# Patient Record
Sex: Female | Born: 1940 | Race: White | Hispanic: No | Smoking: Former smoker
Health system: Southern US, Community
[De-identification: ages and names within clinical notes are randomized; demographics above are authoritative.]

## PROBLEM LIST (undated history)

## (undated) DIAGNOSIS — R296 Repeated falls: Secondary | ICD-10-CM

## (undated) DIAGNOSIS — I10 Essential (primary) hypertension: Secondary | ICD-10-CM

## (undated) DIAGNOSIS — R41841 Cognitive communication deficit: Secondary | ICD-10-CM

## (undated) DIAGNOSIS — F32A Depression, unspecified: Secondary | ICD-10-CM

## (undated) DIAGNOSIS — E785 Hyperlipidemia, unspecified: Secondary | ICD-10-CM

## (undated) DIAGNOSIS — F419 Anxiety disorder, unspecified: Secondary | ICD-10-CM

## (undated) DIAGNOSIS — F329 Major depressive disorder, single episode, unspecified: Secondary | ICD-10-CM

## (undated) DIAGNOSIS — F0390 Unspecified dementia without behavioral disturbance: Secondary | ICD-10-CM

## (undated) DIAGNOSIS — T148XXA Other injury of unspecified body region, initial encounter: Secondary | ICD-10-CM

## (undated) DIAGNOSIS — I2510 Atherosclerotic heart disease of native coronary artery without angina pectoris: Secondary | ICD-10-CM

## (undated) DIAGNOSIS — R293 Abnormal posture: Secondary | ICD-10-CM

## (undated) HISTORY — DX: Unspecified dementia, unspecified severity, without behavioral disturbance, psychotic disturbance, mood disturbance, and anxiety: F03.90

## (undated) HISTORY — PX: NECK SURGERY: SHX720

## (undated) HISTORY — DX: Hyperlipidemia, unspecified: E78.5

## (undated) HISTORY — DX: Depression, unspecified: F32.A

## (undated) HISTORY — DX: Repeated falls: R29.6

## (undated) HISTORY — DX: Cognitive communication deficit: R41.841

## (undated) HISTORY — PX: VALVE REPLACEMENT: SUR13

## (undated) HISTORY — DX: Essential (primary) hypertension: I10

## (undated) HISTORY — DX: Anxiety disorder, unspecified: F41.9

## (undated) HISTORY — DX: Major depressive disorder, single episode, unspecified: F32.9

## (undated) HISTORY — DX: Abnormal posture: R29.3

## (undated) HISTORY — DX: Other injury of unspecified body region, initial encounter: T14.8XXA

## (undated) HISTORY — PX: CORONARY ARTERY BYPASS GRAFT: SHX141

## (undated) HISTORY — DX: Atherosclerotic heart disease of native coronary artery without angina pectoris: I25.10

---

## 2014-06-02 DIAGNOSIS — J019 Acute sinusitis, unspecified: Secondary | ICD-10-CM | POA: Diagnosis not present

## 2014-06-02 DIAGNOSIS — J309 Allergic rhinitis, unspecified: Secondary | ICD-10-CM | POA: Diagnosis not present

## 2014-06-02 DIAGNOSIS — F0390 Unspecified dementia without behavioral disturbance: Secondary | ICD-10-CM | POA: Diagnosis not present

## 2014-10-21 DIAGNOSIS — F329 Major depressive disorder, single episode, unspecified: Secondary | ICD-10-CM | POA: Diagnosis not present

## 2014-10-21 DIAGNOSIS — F0390 Unspecified dementia without behavioral disturbance: Secondary | ICD-10-CM | POA: Diagnosis not present

## 2014-10-21 DIAGNOSIS — I10 Essential (primary) hypertension: Secondary | ICD-10-CM | POA: Diagnosis not present

## 2014-10-21 DIAGNOSIS — M545 Low back pain: Secondary | ICD-10-CM | POA: Diagnosis not present

## 2014-11-07 DIAGNOSIS — S0501XA Injury of conjunctiva and corneal abrasion without foreign body, right eye, initial encounter: Secondary | ICD-10-CM | POA: Diagnosis not present

## 2014-11-08 DIAGNOSIS — S0500XD Injury of conjunctiva and corneal abrasion without foreign body, unspecified eye, subsequent encounter: Secondary | ICD-10-CM | POA: Diagnosis not present

## 2014-12-03 DIAGNOSIS — Z01818 Encounter for other preprocedural examination: Secondary | ICD-10-CM | POA: Diagnosis not present

## 2014-12-03 DIAGNOSIS — R002 Palpitations: Secondary | ICD-10-CM | POA: Diagnosis not present

## 2014-12-03 DIAGNOSIS — I2510 Atherosclerotic heart disease of native coronary artery without angina pectoris: Secondary | ICD-10-CM | POA: Diagnosis not present

## 2015-01-02 DIAGNOSIS — L309 Dermatitis, unspecified: Secondary | ICD-10-CM | POA: Diagnosis not present

## 2015-01-02 DIAGNOSIS — M545 Low back pain: Secondary | ICD-10-CM | POA: Diagnosis not present

## 2015-01-02 DIAGNOSIS — E784 Other hyperlipidemia: Secondary | ICD-10-CM | POA: Diagnosis not present

## 2015-01-02 DIAGNOSIS — F329 Major depressive disorder, single episode, unspecified: Secondary | ICD-10-CM | POA: Diagnosis not present

## 2015-09-09 DIAGNOSIS — F329 Major depressive disorder, single episode, unspecified: Secondary | ICD-10-CM | POA: Diagnosis not present

## 2015-09-09 DIAGNOSIS — M545 Low back pain: Secondary | ICD-10-CM | POA: Diagnosis not present

## 2015-09-09 DIAGNOSIS — E785 Hyperlipidemia, unspecified: Secondary | ICD-10-CM | POA: Diagnosis not present

## 2015-10-22 DIAGNOSIS — M4186 Other forms of scoliosis, lumbar region: Secondary | ICD-10-CM | POA: Diagnosis not present

## 2015-11-02 DIAGNOSIS — M4186 Other forms of scoliosis, lumbar region: Secondary | ICD-10-CM | POA: Diagnosis not present

## 2015-11-02 DIAGNOSIS — S32020A Wedge compression fracture of second lumbar vertebra, initial encounter for closed fracture: Secondary | ICD-10-CM | POA: Diagnosis not present

## 2015-11-02 DIAGNOSIS — R937 Abnormal findings on diagnostic imaging of other parts of musculoskeletal system: Secondary | ICD-10-CM | POA: Diagnosis not present

## 2015-12-03 DIAGNOSIS — S32020D Wedge compression fracture of second lumbar vertebra, subsequent encounter for fracture with routine healing: Secondary | ICD-10-CM | POA: Diagnosis not present

## 2015-12-03 DIAGNOSIS — M5126 Other intervertebral disc displacement, lumbar region: Secondary | ICD-10-CM | POA: Diagnosis not present

## 2015-12-30 DIAGNOSIS — S32020D Wedge compression fracture of second lumbar vertebra, subsequent encounter for fracture with routine healing: Secondary | ICD-10-CM | POA: Diagnosis not present

## 2015-12-30 DIAGNOSIS — M5126 Other intervertebral disc displacement, lumbar region: Secondary | ICD-10-CM | POA: Diagnosis not present

## 2016-03-08 DIAGNOSIS — Z23 Encounter for immunization: Secondary | ICD-10-CM | POA: Diagnosis not present

## 2016-03-08 DIAGNOSIS — E785 Hyperlipidemia, unspecified: Secondary | ICD-10-CM | POA: Diagnosis not present

## 2016-03-08 DIAGNOSIS — F3341 Major depressive disorder, recurrent, in partial remission: Secondary | ICD-10-CM | POA: Diagnosis not present

## 2016-03-08 DIAGNOSIS — F0390 Unspecified dementia without behavioral disturbance: Secondary | ICD-10-CM | POA: Diagnosis not present

## 2016-03-08 DIAGNOSIS — M5136 Other intervertebral disc degeneration, lumbar region: Secondary | ICD-10-CM | POA: Diagnosis not present

## 2016-03-08 DIAGNOSIS — I10 Essential (primary) hypertension: Secondary | ICD-10-CM | POA: Diagnosis not present

## 2016-03-18 DIAGNOSIS — M545 Low back pain: Secondary | ICD-10-CM | POA: Diagnosis not present

## 2016-03-23 DIAGNOSIS — M545 Low back pain: Secondary | ICD-10-CM | POA: Diagnosis not present

## 2016-03-25 DIAGNOSIS — M545 Low back pain: Secondary | ICD-10-CM | POA: Diagnosis not present

## 2016-03-30 DIAGNOSIS — M545 Low back pain: Secondary | ICD-10-CM | POA: Diagnosis not present

## 2016-04-01 DIAGNOSIS — M545 Low back pain: Secondary | ICD-10-CM | POA: Diagnosis not present

## 2016-04-06 DIAGNOSIS — M545 Low back pain: Secondary | ICD-10-CM | POA: Diagnosis not present

## 2016-04-08 DIAGNOSIS — M545 Low back pain: Secondary | ICD-10-CM | POA: Diagnosis not present

## 2016-04-13 DIAGNOSIS — M545 Low back pain: Secondary | ICD-10-CM | POA: Diagnosis not present

## 2016-04-15 DIAGNOSIS — M545 Low back pain: Secondary | ICD-10-CM | POA: Diagnosis not present

## 2016-05-09 DIAGNOSIS — L989 Disorder of the skin and subcutaneous tissue, unspecified: Secondary | ICD-10-CM | POA: Diagnosis not present

## 2016-05-09 DIAGNOSIS — M5136 Other intervertebral disc degeneration, lumbar region: Secondary | ICD-10-CM | POA: Diagnosis not present

## 2016-05-09 DIAGNOSIS — F3341 Major depressive disorder, recurrent, in partial remission: Secondary | ICD-10-CM | POA: Diagnosis not present

## 2016-05-09 DIAGNOSIS — I10 Essential (primary) hypertension: Secondary | ICD-10-CM | POA: Diagnosis not present

## 2016-05-09 DIAGNOSIS — F0390 Unspecified dementia without behavioral disturbance: Secondary | ICD-10-CM | POA: Diagnosis not present

## 2016-09-07 DIAGNOSIS — I10 Essential (primary) hypertension: Secondary | ICD-10-CM | POA: Diagnosis not present

## 2016-09-07 DIAGNOSIS — J40 Bronchitis, not specified as acute or chronic: Secondary | ICD-10-CM | POA: Diagnosis not present

## 2016-09-07 DIAGNOSIS — M5136 Other intervertebral disc degeneration, lumbar region: Secondary | ICD-10-CM | POA: Diagnosis not present

## 2016-09-07 DIAGNOSIS — G894 Chronic pain syndrome: Secondary | ICD-10-CM | POA: Diagnosis not present

## 2016-09-07 DIAGNOSIS — F3341 Major depressive disorder, recurrent, in partial remission: Secondary | ICD-10-CM | POA: Diagnosis not present

## 2016-09-07 DIAGNOSIS — F0390 Unspecified dementia without behavioral disturbance: Secondary | ICD-10-CM | POA: Diagnosis not present

## 2016-11-07 DIAGNOSIS — Z1231 Encounter for screening mammogram for malignant neoplasm of breast: Secondary | ICD-10-CM | POA: Diagnosis not present

## 2016-12-27 DIAGNOSIS — Z961 Presence of intraocular lens: Secondary | ICD-10-CM | POA: Diagnosis not present

## 2016-12-27 DIAGNOSIS — H538 Other visual disturbances: Secondary | ICD-10-CM | POA: Diagnosis not present

## 2017-01-06 DIAGNOSIS — F3341 Major depressive disorder, recurrent, in partial remission: Secondary | ICD-10-CM | POA: Diagnosis not present

## 2017-01-06 DIAGNOSIS — E785 Hyperlipidemia, unspecified: Secondary | ICD-10-CM | POA: Diagnosis not present

## 2017-01-06 DIAGNOSIS — G894 Chronic pain syndrome: Secondary | ICD-10-CM | POA: Diagnosis not present

## 2017-01-06 DIAGNOSIS — F0390 Unspecified dementia without behavioral disturbance: Secondary | ICD-10-CM | POA: Diagnosis not present

## 2017-01-06 DIAGNOSIS — I10 Essential (primary) hypertension: Secondary | ICD-10-CM | POA: Diagnosis not present

## 2017-03-07 DIAGNOSIS — M545 Low back pain: Secondary | ICD-10-CM | POA: Diagnosis not present

## 2017-03-07 DIAGNOSIS — Z23 Encounter for immunization: Secondary | ICD-10-CM | POA: Diagnosis not present

## 2017-03-07 DIAGNOSIS — R29898 Other symptoms and signs involving the musculoskeletal system: Secondary | ICD-10-CM | POA: Diagnosis not present

## 2017-03-07 DIAGNOSIS — M5136 Other intervertebral disc degeneration, lumbar region: Secondary | ICD-10-CM | POA: Diagnosis not present

## 2017-04-07 DIAGNOSIS — G894 Chronic pain syndrome: Secondary | ICD-10-CM | POA: Diagnosis not present

## 2017-04-07 DIAGNOSIS — F3341 Major depressive disorder, recurrent, in partial remission: Secondary | ICD-10-CM | POA: Diagnosis not present

## 2017-04-07 DIAGNOSIS — E785 Hyperlipidemia, unspecified: Secondary | ICD-10-CM | POA: Diagnosis not present

## 2017-04-07 DIAGNOSIS — I10 Essential (primary) hypertension: Secondary | ICD-10-CM | POA: Diagnosis not present

## 2017-04-07 DIAGNOSIS — M5136 Other intervertebral disc degeneration, lumbar region: Secondary | ICD-10-CM | POA: Diagnosis not present

## 2017-04-07 DIAGNOSIS — F0390 Unspecified dementia without behavioral disturbance: Secondary | ICD-10-CM | POA: Diagnosis not present

## 2017-04-18 DIAGNOSIS — M9903 Segmental and somatic dysfunction of lumbar region: Secondary | ICD-10-CM | POA: Diagnosis not present

## 2017-04-18 DIAGNOSIS — M5431 Sciatica, right side: Secondary | ICD-10-CM | POA: Diagnosis not present

## 2017-04-18 DIAGNOSIS — M5432 Sciatica, left side: Secondary | ICD-10-CM | POA: Diagnosis not present

## 2017-04-18 DIAGNOSIS — M9905 Segmental and somatic dysfunction of pelvic region: Secondary | ICD-10-CM | POA: Diagnosis not present

## 2017-04-21 DIAGNOSIS — M9905 Segmental and somatic dysfunction of pelvic region: Secondary | ICD-10-CM | POA: Diagnosis not present

## 2017-04-21 DIAGNOSIS — M5432 Sciatica, left side: Secondary | ICD-10-CM | POA: Diagnosis not present

## 2017-04-21 DIAGNOSIS — M5431 Sciatica, right side: Secondary | ICD-10-CM | POA: Diagnosis not present

## 2017-04-21 DIAGNOSIS — M9903 Segmental and somatic dysfunction of lumbar region: Secondary | ICD-10-CM | POA: Diagnosis not present

## 2017-07-14 DIAGNOSIS — G894 Chronic pain syndrome: Secondary | ICD-10-CM | POA: Diagnosis not present

## 2017-07-14 DIAGNOSIS — F0390 Unspecified dementia without behavioral disturbance: Secondary | ICD-10-CM | POA: Diagnosis not present

## 2017-07-14 DIAGNOSIS — R269 Unspecified abnormalities of gait and mobility: Secondary | ICD-10-CM | POA: Diagnosis not present

## 2017-08-02 ENCOUNTER — Ambulatory Visit (INDEPENDENT_AMBULATORY_CARE_PROVIDER_SITE_OTHER): Payer: Medicare Other | Admitting: Neurology

## 2017-08-02 ENCOUNTER — Encounter: Payer: Self-pay | Admitting: Neurology

## 2017-08-02 VITALS — BP 127/74 | HR 60 | Ht 62.0 in | Wt 138.0 lb

## 2017-08-02 DIAGNOSIS — R413 Other amnesia: Secondary | ICD-10-CM

## 2017-08-02 DIAGNOSIS — R269 Unspecified abnormalities of gait and mobility: Secondary | ICD-10-CM

## 2017-08-02 DIAGNOSIS — E538 Deficiency of other specified B group vitamins: Secondary | ICD-10-CM

## 2017-08-02 NOTE — Progress Notes (Signed)
Reason for visit: Dementia, gait disorder  Referring physician: Dr. Garald Braver Jasmine Carrillo is a 77 y.o. female  History of present illness:  Jasmine Carrillo is a 77 year old white female with a history of a progressive memory disorder that has been present since around 2010.  The patient has been on Aricept and Namenda since 2012.  The patient currently is living with her daughter.  The patient requires assistance with most activities of daily living such as bathing and dressing.  The patient can feed herself.  She sleeps fairly well at night.  She has had chronic low back pain, she has claudication type symptoms with her right leg with walking with pain and cramping in the right calf area that improves with rest.  The patient was a prior smoker.  She does not smoke cigarettes currently.  She does not operate a motor vehicle.  She needs help with keeping up with medications and appointments and with the finances.  The patient also has developed some myoclonus of the arms or legs that may occur off and on.  This has been more prominent over the last 2-3 months.  The patient has not had any recent falls, she may fall on occasion.  The patient reports no numbness of the extremities.  She does feel somewhat weak in the legs.  She does have some urinary incontinence issues.  The patient is sent to this office for further evaluation.  She has started to shuffle her feet with walking, there is some concern for parkinsonism.  Past Medical History:  Diagnosis Date  . Anxiety   . Depression     Past Surgical History:  Procedure Laterality Date  . CORONARY ARTERY BYPASS GRAFT      History reviewed. No pertinent family history.  Social history:  reports that she quit smoking about 11 years ago. she has never used smokeless tobacco. She reports that she drinks alcohol. She reports that she does not use drugs.  Medications:  Prior to Admission medications   Medication Sig Start Date End Date Taking?  Authorizing Provider  ASPIRIN 81 PO Take 1 tablet by mouth daily.   Yes [provider]  CALCIUM PO Take 1 tablet by mouth daily.   Yes [provider]  carvedilol (COREG) 12.5 MG tablet Take 1 tablet by mouth 2 (two) times daily. 03/23/11  Yes [provider]  donepezil (ARICEPT) 10 MG tablet Take 1 tablet by mouth 2 (two) times daily. 03/23/11  Yes [provider]  memantine (NAMENDA) 10 MG tablet Take 1 tablet by mouth 2 (two) times daily. 02/24/15  Yes [provider]  Multiple Vitamins-Minerals (MULTIVITAMIN PO) Take 1 tablet by mouth daily.   Yes [provider]  simvastatin (ZOCOR) 80 MG tablet Take 0.5 tablets by mouth daily. 03/23/11  Yes [provider]  Tretinoin, Facial Wrinkles, (TRETINOIN, EMOLLIENT,) 0.05 % CREA Take 1 application by mouth as needed. 06/20/11  Yes [provider]     No Known Allergies  ROS:  Out of a complete 14 system review of symptoms, the patient complains only of the following symptoms, and all other reviewed systems are negative.  Weight loss, fatigue Swelling in the legs Snoring Easy bruising Joint pain, aching muscles Memory loss, confusion, weakness, slurred speech, tremor Depression, anxiety, change in appetite  Blood pressure 127/74, pulse 60, height 5\' 2"  (1.575 m), weight 138 lb (62.6 kg).  Physical Exam  General: The patient is alert and cooperative at the time  of the examination.  Eyes: Pupils are equal, round, and reactive to light. Discs are flat bilaterally.  Neck: The neck is supple, no carotid bruits are noted.  Respiratory: The respiratory examination is clear.  Cardiovascular: The cardiovascular examination reveals a regular rate and rhythm, no obvious murmurs or rubs are noted.  Skin: Extremities are without significant edema.  Neurologic Exam  Mental status: The patient is alert and oriented x 1 at the time of the examination (oriented only to  name). The Mini-Mental status examination done today shows a total score 4/30.  Cranial nerves: Facial symmetry is present. There is good sensation of the face to pinprick and soft touch bilaterally. The strength of the facial muscles and the muscles to head turning and shoulder shrug are normal bilaterally. Speech is well enunciated, no aphasia or dysarthria is noted. Extraocular movements are full. Visual fields are full. The tongue is midline, and the patient has symmetric elevation of the soft palate. No obvious hearing deficits are noted.  The patient does have some mild masking of the face.  Motor: The motor testing reveals 5 over 5 strength of all 4 extremities. Good symmetric motor tone is noted throughout.  Sensory: Sensory testing is intact to pinprick, soft touch and vibration sensation on all 4 extremities. No evidence of extinction is noted.  Coordination: Cerebellar testing reveals significant apraxia with finger-nose-finger and heel shin bilaterally.  The patient is not able to perform these tasks.  Occasional myoclonus is seen in the upper extremities.  Mild asterixis is seen with arms outstretched.  Gait and station: The patient is able to arise from a seated position with arms crossed with some difficulty.  Once up, she is able to ambulate independently, posture is stooped, she takes shuffling steps.  Gait is slightly wide-based.  Tandem gait was not attempted.  Romberg is negative.  Reflexes: Deep tendon reflexes are symmetric and normal bilaterally. Toes are downgoing bilaterally.   Assessment/Plan:  1.  Alzheimer's disease, dementia  2.  Gait disturbance  3.  Myoclonus/asterixis  4.  Low back pain, right leg claudication  The patient has severe dementia.  She has evidence of significant apraxia with use of the extremities and this may be translating into changes with walking.  The patient does not have clear Parkinson's disease, but this will need to be followed over  time.  The patient does have some myoclonus which may be associated with the use of hydrocodone but also can be seen with significant dementia.  The patient does have low back issues as well, she appears to have what could be a claudication syndrome of the right leg.  The patient will be set up for blood work today, she will have CT scan of the brain.  She will follow-up in 4 or 5 months.  Marlan Palau. Keith Willis MD 08/02/2017 12:09 PM  [facility] [address] [place], [place] [id number]  Phone [phone] Fax [phone]

## 2017-08-02 NOTE — Patient Instructions (Signed)
   We will check CT of the brain. 

## 2017-08-03 LAB — RPR: RPR: NONREACTIVE

## 2017-08-03 LAB — COMPREHENSIVE METABOLIC PANEL
ALT: 10 IU/L (ref 0–32)
AST: 15 IU/L (ref 0–40)
Albumin/Globulin Ratio: 1.5 (ref 1.2–2.2)
Albumin: 4 g/dL (ref 3.5–4.8)
Alkaline Phosphatase: 68 IU/L (ref 39–117)
BUN/Creatinine Ratio: 40 — ABNORMAL HIGH (ref 12–28)
BUN: 25 mg/dL (ref 8–27)
Bilirubin Total: 0.2 mg/dL (ref 0.0–1.2)
CALCIUM: 8.9 mg/dL (ref 8.7–10.3)
CHLORIDE: 104 mmol/L (ref 96–106)
CO2: 27 mmol/L (ref 20–29)
Creatinine, Ser: 0.62 mg/dL (ref 0.57–1.00)
GFR, EST AFRICAN AMERICAN: 101 mL/min/{1.73_m2} (ref >59)
GFR, EST NON AFRICAN AMERICAN: 88 mL/min/{1.73_m2} (ref >59)
Globulin, Total: 2.7 g/dL (ref 1.5–4.5)
Glucose: 85 mg/dL (ref 65–99)
Potassium: 4.8 mmol/L (ref 3.5–5.2)
Sodium: 143 mmol/L (ref 134–144)
TOTAL PROTEIN: 6.7 g/dL (ref 6.0–8.5)

## 2017-08-03 LAB — SEDIMENTATION RATE: SED RATE: 4 mm/h (ref 0–40)

## 2017-08-03 LAB — VITAMIN B12: Vitamin B-12: 485 pg/mL (ref 232–1245)

## 2017-08-03 LAB — AMMONIA: AMMONIA: 68 ug/dL (ref 19–87)

## 2017-08-04 ENCOUNTER — Telehealth: Payer: Self-pay

## 2017-08-04 NOTE — Telephone Encounter (Signed)
I called Jasmine Carrillo, advised her that Dr. Anne HahnWillis reviewed her blood work and reports that it is unremarkable. Jasmine Carrillo verbalized understanding of results. Jasmine Carrillo had no questions at this time but was encouraged to call back if questions arise.

## 2017-08-04 NOTE — Telephone Encounter (Signed)
-----   Message from York Spanielharles K Willis, MD sent at 08/03/2017  5:27 PM EST -----  The blood work results are unremarkable. Please call the patient.  ----- Message ----- From: Nell RangeInterface, [facility] Lab Results In Sent: 08/03/2017   7:45 AM To: York Spanielharles K Willis, MD

## 2017-08-14 ENCOUNTER — Other Ambulatory Visit: Payer: Self-pay | Admitting: Family Medicine

## 2017-08-14 ENCOUNTER — Ambulatory Visit
Admission: RE | Admit: 2017-08-14 | Discharge: 2017-08-14 | Disposition: A | Discharge status: Home / Self Care | Payer: Medicare Other | Source: Ambulatory Visit | Attending: Family Medicine | Admitting: Family Medicine

## 2017-08-14 DIAGNOSIS — M545 Low back pain, unspecified: Secondary | ICD-10-CM

## 2017-09-04 DIAGNOSIS — G894 Chronic pain syndrome: Secondary | ICD-10-CM | POA: Diagnosis not present

## 2017-09-04 DIAGNOSIS — M5136 Other intervertebral disc degeneration, lumbar region: Secondary | ICD-10-CM | POA: Diagnosis not present

## 2017-09-04 DIAGNOSIS — F3341 Major depressive disorder, recurrent, in partial remission: Secondary | ICD-10-CM | POA: Diagnosis not present

## 2017-09-04 DIAGNOSIS — F0390 Unspecified dementia without behavioral disturbance: Secondary | ICD-10-CM | POA: Diagnosis not present

## 2017-09-10 DIAGNOSIS — J449 Chronic obstructive pulmonary disease, unspecified: Secondary | ICD-10-CM | POA: Diagnosis not present

## 2017-09-10 DIAGNOSIS — I739 Peripheral vascular disease, unspecified: Secondary | ICD-10-CM | POA: Diagnosis not present

## 2017-09-10 DIAGNOSIS — G894 Chronic pain syndrome: Secondary | ICD-10-CM | POA: Diagnosis not present

## 2017-09-10 DIAGNOSIS — M5136 Other intervertebral disc degeneration, lumbar region: Secondary | ICD-10-CM | POA: Diagnosis not present

## 2017-09-10 DIAGNOSIS — Z87891 Personal history of nicotine dependence: Secondary | ICD-10-CM | POA: Diagnosis not present

## 2017-09-10 DIAGNOSIS — S32000D Wedge compression fracture of unspecified lumbar vertebra, subsequent encounter for fracture with routine healing: Secondary | ICD-10-CM | POA: Diagnosis not present

## 2017-09-10 DIAGNOSIS — E785 Hyperlipidemia, unspecified: Secondary | ICD-10-CM | POA: Diagnosis not present

## 2017-09-10 DIAGNOSIS — F3341 Major depressive disorder, recurrent, in partial remission: Secondary | ICD-10-CM | POA: Diagnosis not present

## 2017-09-10 DIAGNOSIS — I10 Essential (primary) hypertension: Secondary | ICD-10-CM | POA: Diagnosis not present

## 2017-09-10 DIAGNOSIS — F0390 Unspecified dementia without behavioral disturbance: Secondary | ICD-10-CM | POA: Diagnosis not present

## 2017-09-13 DIAGNOSIS — S32000D Wedge compression fracture of unspecified lumbar vertebra, subsequent encounter for fracture with routine healing: Secondary | ICD-10-CM | POA: Diagnosis not present

## 2017-09-13 DIAGNOSIS — F0390 Unspecified dementia without behavioral disturbance: Secondary | ICD-10-CM | POA: Diagnosis not present

## 2017-09-13 DIAGNOSIS — G894 Chronic pain syndrome: Secondary | ICD-10-CM | POA: Diagnosis not present

## 2017-09-13 DIAGNOSIS — J449 Chronic obstructive pulmonary disease, unspecified: Secondary | ICD-10-CM | POA: Diagnosis not present

## 2017-09-13 DIAGNOSIS — I739 Peripheral vascular disease, unspecified: Secondary | ICD-10-CM | POA: Diagnosis not present

## 2017-09-13 DIAGNOSIS — M5136 Other intervertebral disc degeneration, lumbar region: Secondary | ICD-10-CM | POA: Diagnosis not present

## 2017-09-20 DIAGNOSIS — S32000D Wedge compression fracture of unspecified lumbar vertebra, subsequent encounter for fracture with routine healing: Secondary | ICD-10-CM | POA: Diagnosis not present

## 2017-09-20 DIAGNOSIS — F0390 Unspecified dementia without behavioral disturbance: Secondary | ICD-10-CM | POA: Diagnosis not present

## 2017-09-20 DIAGNOSIS — J449 Chronic obstructive pulmonary disease, unspecified: Secondary | ICD-10-CM | POA: Diagnosis not present

## 2017-09-20 DIAGNOSIS — I739 Peripheral vascular disease, unspecified: Secondary | ICD-10-CM | POA: Diagnosis not present

## 2017-09-20 DIAGNOSIS — M5136 Other intervertebral disc degeneration, lumbar region: Secondary | ICD-10-CM | POA: Diagnosis not present

## 2017-09-20 DIAGNOSIS — G894 Chronic pain syndrome: Secondary | ICD-10-CM | POA: Diagnosis not present

## 2017-09-22 DIAGNOSIS — M5136 Other intervertebral disc degeneration, lumbar region: Secondary | ICD-10-CM | POA: Diagnosis not present

## 2017-09-22 DIAGNOSIS — S32000D Wedge compression fracture of unspecified lumbar vertebra, subsequent encounter for fracture with routine healing: Secondary | ICD-10-CM | POA: Diagnosis not present

## 2017-09-22 DIAGNOSIS — F0390 Unspecified dementia without behavioral disturbance: Secondary | ICD-10-CM | POA: Diagnosis not present

## 2017-09-22 DIAGNOSIS — G894 Chronic pain syndrome: Secondary | ICD-10-CM | POA: Diagnosis not present

## 2017-09-22 DIAGNOSIS — I739 Peripheral vascular disease, unspecified: Secondary | ICD-10-CM | POA: Diagnosis not present

## 2017-09-22 DIAGNOSIS — J449 Chronic obstructive pulmonary disease, unspecified: Secondary | ICD-10-CM | POA: Diagnosis not present

## 2017-09-27 DIAGNOSIS — F0390 Unspecified dementia without behavioral disturbance: Secondary | ICD-10-CM | POA: Diagnosis not present

## 2017-09-27 DIAGNOSIS — I739 Peripheral vascular disease, unspecified: Secondary | ICD-10-CM | POA: Diagnosis not present

## 2017-09-27 DIAGNOSIS — M5136 Other intervertebral disc degeneration, lumbar region: Secondary | ICD-10-CM | POA: Diagnosis not present

## 2017-09-27 DIAGNOSIS — J449 Chronic obstructive pulmonary disease, unspecified: Secondary | ICD-10-CM | POA: Diagnosis not present

## 2017-09-27 DIAGNOSIS — S32000D Wedge compression fracture of unspecified lumbar vertebra, subsequent encounter for fracture with routine healing: Secondary | ICD-10-CM | POA: Diagnosis not present

## 2017-09-27 DIAGNOSIS — G894 Chronic pain syndrome: Secondary | ICD-10-CM | POA: Diagnosis not present

## 2017-09-29 DIAGNOSIS — M5136 Other intervertebral disc degeneration, lumbar region: Secondary | ICD-10-CM | POA: Diagnosis not present

## 2017-09-29 DIAGNOSIS — G894 Chronic pain syndrome: Secondary | ICD-10-CM | POA: Diagnosis not present

## 2017-09-29 DIAGNOSIS — I739 Peripheral vascular disease, unspecified: Secondary | ICD-10-CM | POA: Diagnosis not present

## 2017-09-29 DIAGNOSIS — J449 Chronic obstructive pulmonary disease, unspecified: Secondary | ICD-10-CM | POA: Diagnosis not present

## 2017-09-29 DIAGNOSIS — F0390 Unspecified dementia without behavioral disturbance: Secondary | ICD-10-CM | POA: Diagnosis not present

## 2017-09-29 DIAGNOSIS — S32000D Wedge compression fracture of unspecified lumbar vertebra, subsequent encounter for fracture with routine healing: Secondary | ICD-10-CM | POA: Diagnosis not present

## 2017-10-03 DIAGNOSIS — J449 Chronic obstructive pulmonary disease, unspecified: Secondary | ICD-10-CM | POA: Diagnosis not present

## 2017-10-03 DIAGNOSIS — M5136 Other intervertebral disc degeneration, lumbar region: Secondary | ICD-10-CM | POA: Diagnosis not present

## 2017-10-03 DIAGNOSIS — S32000D Wedge compression fracture of unspecified lumbar vertebra, subsequent encounter for fracture with routine healing: Secondary | ICD-10-CM | POA: Diagnosis not present

## 2017-10-03 DIAGNOSIS — F0390 Unspecified dementia without behavioral disturbance: Secondary | ICD-10-CM | POA: Diagnosis not present

## 2017-10-03 DIAGNOSIS — I739 Peripheral vascular disease, unspecified: Secondary | ICD-10-CM | POA: Diagnosis not present

## 2017-10-03 DIAGNOSIS — G894 Chronic pain syndrome: Secondary | ICD-10-CM | POA: Diagnosis not present

## 2017-10-06 DIAGNOSIS — I739 Peripheral vascular disease, unspecified: Secondary | ICD-10-CM | POA: Diagnosis not present

## 2017-10-06 DIAGNOSIS — J449 Chronic obstructive pulmonary disease, unspecified: Secondary | ICD-10-CM | POA: Diagnosis not present

## 2017-10-06 DIAGNOSIS — S32000D Wedge compression fracture of unspecified lumbar vertebra, subsequent encounter for fracture with routine healing: Secondary | ICD-10-CM | POA: Diagnosis not present

## 2017-10-06 DIAGNOSIS — F0390 Unspecified dementia without behavioral disturbance: Secondary | ICD-10-CM | POA: Diagnosis not present

## 2017-10-06 DIAGNOSIS — M5136 Other intervertebral disc degeneration, lumbar region: Secondary | ICD-10-CM | POA: Diagnosis not present

## 2017-10-06 DIAGNOSIS — G894 Chronic pain syndrome: Secondary | ICD-10-CM | POA: Diagnosis not present

## 2017-10-11 DIAGNOSIS — F0390 Unspecified dementia without behavioral disturbance: Secondary | ICD-10-CM | POA: Diagnosis not present

## 2017-10-11 DIAGNOSIS — G894 Chronic pain syndrome: Secondary | ICD-10-CM | POA: Diagnosis not present

## 2017-10-11 DIAGNOSIS — J449 Chronic obstructive pulmonary disease, unspecified: Secondary | ICD-10-CM | POA: Diagnosis not present

## 2017-10-11 DIAGNOSIS — S32000D Wedge compression fracture of unspecified lumbar vertebra, subsequent encounter for fracture with routine healing: Secondary | ICD-10-CM | POA: Diagnosis not present

## 2017-10-11 DIAGNOSIS — I739 Peripheral vascular disease, unspecified: Secondary | ICD-10-CM | POA: Diagnosis not present

## 2017-10-11 DIAGNOSIS — M5136 Other intervertebral disc degeneration, lumbar region: Secondary | ICD-10-CM | POA: Diagnosis not present

## 2017-10-17 DIAGNOSIS — J449 Chronic obstructive pulmonary disease, unspecified: Secondary | ICD-10-CM | POA: Diagnosis not present

## 2017-10-17 DIAGNOSIS — S32000D Wedge compression fracture of unspecified lumbar vertebra, subsequent encounter for fracture with routine healing: Secondary | ICD-10-CM | POA: Diagnosis not present

## 2017-10-17 DIAGNOSIS — I739 Peripheral vascular disease, unspecified: Secondary | ICD-10-CM | POA: Diagnosis not present

## 2017-10-17 DIAGNOSIS — M5136 Other intervertebral disc degeneration, lumbar region: Secondary | ICD-10-CM | POA: Diagnosis not present

## 2017-10-17 DIAGNOSIS — G894 Chronic pain syndrome: Secondary | ICD-10-CM | POA: Diagnosis not present

## 2017-10-17 DIAGNOSIS — F0390 Unspecified dementia without behavioral disturbance: Secondary | ICD-10-CM | POA: Diagnosis not present

## 2017-12-14 DIAGNOSIS — R51 Headache: Secondary | ICD-10-CM | POA: Diagnosis not present

## 2017-12-14 DIAGNOSIS — F3341 Major depressive disorder, recurrent, in partial remission: Secondary | ICD-10-CM | POA: Diagnosis not present

## 2017-12-14 DIAGNOSIS — G894 Chronic pain syndrome: Secondary | ICD-10-CM | POA: Diagnosis not present

## 2017-12-14 DIAGNOSIS — F0390 Unspecified dementia without behavioral disturbance: Secondary | ICD-10-CM | POA: Diagnosis not present

## 2017-12-14 DIAGNOSIS — M5136 Other intervertebral disc degeneration, lumbar region: Secondary | ICD-10-CM | POA: Diagnosis not present

## 2017-12-14 DIAGNOSIS — E785 Hyperlipidemia, unspecified: Secondary | ICD-10-CM | POA: Diagnosis not present

## 2017-12-14 DIAGNOSIS — I10 Essential (primary) hypertension: Secondary | ICD-10-CM | POA: Diagnosis not present

## 2017-12-20 DIAGNOSIS — E785 Hyperlipidemia, unspecified: Secondary | ICD-10-CM | POA: Diagnosis not present

## 2017-12-20 DIAGNOSIS — Z87891 Personal history of nicotine dependence: Secondary | ICD-10-CM | POA: Diagnosis not present

## 2017-12-20 DIAGNOSIS — Z7982 Long term (current) use of aspirin: Secondary | ICD-10-CM | POA: Diagnosis not present

## 2017-12-20 DIAGNOSIS — F0280 Dementia in other diseases classified elsewhere without behavioral disturbance: Secondary | ICD-10-CM | POA: Diagnosis not present

## 2017-12-20 DIAGNOSIS — J449 Chronic obstructive pulmonary disease, unspecified: Secondary | ICD-10-CM | POA: Diagnosis not present

## 2017-12-20 DIAGNOSIS — M5136 Other intervertebral disc degeneration, lumbar region: Secondary | ICD-10-CM | POA: Diagnosis not present

## 2017-12-20 DIAGNOSIS — G894 Chronic pain syndrome: Secondary | ICD-10-CM | POA: Diagnosis not present

## 2017-12-20 DIAGNOSIS — I10 Essential (primary) hypertension: Secondary | ICD-10-CM | POA: Diagnosis not present

## 2017-12-20 DIAGNOSIS — I739 Peripheral vascular disease, unspecified: Secondary | ICD-10-CM | POA: Diagnosis not present

## 2017-12-21 DIAGNOSIS — M5136 Other intervertebral disc degeneration, lumbar region: Secondary | ICD-10-CM | POA: Diagnosis not present

## 2017-12-21 DIAGNOSIS — G894 Chronic pain syndrome: Secondary | ICD-10-CM | POA: Diagnosis not present

## 2017-12-21 DIAGNOSIS — J449 Chronic obstructive pulmonary disease, unspecified: Secondary | ICD-10-CM | POA: Diagnosis not present

## 2017-12-21 DIAGNOSIS — I739 Peripheral vascular disease, unspecified: Secondary | ICD-10-CM | POA: Diagnosis not present

## 2017-12-21 DIAGNOSIS — F0280 Dementia in other diseases classified elsewhere without behavioral disturbance: Secondary | ICD-10-CM | POA: Diagnosis not present

## 2017-12-21 DIAGNOSIS — I10 Essential (primary) hypertension: Secondary | ICD-10-CM | POA: Diagnosis not present

## 2017-12-27 DIAGNOSIS — I739 Peripheral vascular disease, unspecified: Secondary | ICD-10-CM | POA: Diagnosis not present

## 2017-12-27 DIAGNOSIS — J449 Chronic obstructive pulmonary disease, unspecified: Secondary | ICD-10-CM | POA: Diagnosis not present

## 2017-12-27 DIAGNOSIS — I10 Essential (primary) hypertension: Secondary | ICD-10-CM | POA: Diagnosis not present

## 2017-12-27 DIAGNOSIS — G894 Chronic pain syndrome: Secondary | ICD-10-CM | POA: Diagnosis not present

## 2017-12-27 DIAGNOSIS — M5136 Other intervertebral disc degeneration, lumbar region: Secondary | ICD-10-CM | POA: Diagnosis not present

## 2017-12-27 DIAGNOSIS — F0280 Dementia in other diseases classified elsewhere without behavioral disturbance: Secondary | ICD-10-CM | POA: Diagnosis not present

## 2017-12-28 DIAGNOSIS — J449 Chronic obstructive pulmonary disease, unspecified: Secondary | ICD-10-CM | POA: Diagnosis not present

## 2017-12-28 DIAGNOSIS — F0280 Dementia in other diseases classified elsewhere without behavioral disturbance: Secondary | ICD-10-CM | POA: Diagnosis not present

## 2017-12-28 DIAGNOSIS — M5136 Other intervertebral disc degeneration, lumbar region: Secondary | ICD-10-CM | POA: Diagnosis not present

## 2017-12-28 DIAGNOSIS — I10 Essential (primary) hypertension: Secondary | ICD-10-CM | POA: Diagnosis not present

## 2017-12-28 DIAGNOSIS — I739 Peripheral vascular disease, unspecified: Secondary | ICD-10-CM | POA: Diagnosis not present

## 2017-12-28 DIAGNOSIS — G894 Chronic pain syndrome: Secondary | ICD-10-CM | POA: Diagnosis not present

## 2017-12-29 DIAGNOSIS — G894 Chronic pain syndrome: Secondary | ICD-10-CM | POA: Diagnosis not present

## 2017-12-29 DIAGNOSIS — I10 Essential (primary) hypertension: Secondary | ICD-10-CM | POA: Diagnosis not present

## 2017-12-29 DIAGNOSIS — F0280 Dementia in other diseases classified elsewhere without behavioral disturbance: Secondary | ICD-10-CM | POA: Diagnosis not present

## 2017-12-29 DIAGNOSIS — M5136 Other intervertebral disc degeneration, lumbar region: Secondary | ICD-10-CM | POA: Diagnosis not present

## 2017-12-29 DIAGNOSIS — I739 Peripheral vascular disease, unspecified: Secondary | ICD-10-CM | POA: Diagnosis not present

## 2017-12-29 DIAGNOSIS — J449 Chronic obstructive pulmonary disease, unspecified: Secondary | ICD-10-CM | POA: Diagnosis not present

## 2018-01-02 DIAGNOSIS — I739 Peripheral vascular disease, unspecified: Secondary | ICD-10-CM | POA: Diagnosis not present

## 2018-01-02 DIAGNOSIS — F0280 Dementia in other diseases classified elsewhere without behavioral disturbance: Secondary | ICD-10-CM | POA: Diagnosis not present

## 2018-01-02 DIAGNOSIS — M5136 Other intervertebral disc degeneration, lumbar region: Secondary | ICD-10-CM | POA: Diagnosis not present

## 2018-01-02 DIAGNOSIS — G894 Chronic pain syndrome: Secondary | ICD-10-CM | POA: Diagnosis not present

## 2018-01-02 DIAGNOSIS — I10 Essential (primary) hypertension: Secondary | ICD-10-CM | POA: Diagnosis not present

## 2018-01-02 DIAGNOSIS — J449 Chronic obstructive pulmonary disease, unspecified: Secondary | ICD-10-CM | POA: Diagnosis not present

## 2018-01-04 DIAGNOSIS — I739 Peripheral vascular disease, unspecified: Secondary | ICD-10-CM | POA: Diagnosis not present

## 2018-01-04 DIAGNOSIS — G894 Chronic pain syndrome: Secondary | ICD-10-CM | POA: Diagnosis not present

## 2018-01-04 DIAGNOSIS — J449 Chronic obstructive pulmonary disease, unspecified: Secondary | ICD-10-CM | POA: Diagnosis not present

## 2018-01-04 DIAGNOSIS — I10 Essential (primary) hypertension: Secondary | ICD-10-CM | POA: Diagnosis not present

## 2018-01-04 DIAGNOSIS — F0280 Dementia in other diseases classified elsewhere without behavioral disturbance: Secondary | ICD-10-CM | POA: Diagnosis not present

## 2018-01-04 DIAGNOSIS — M5136 Other intervertebral disc degeneration, lumbar region: Secondary | ICD-10-CM | POA: Diagnosis not present

## 2018-01-05 DIAGNOSIS — G894 Chronic pain syndrome: Secondary | ICD-10-CM | POA: Diagnosis not present

## 2018-01-05 DIAGNOSIS — F0280 Dementia in other diseases classified elsewhere without behavioral disturbance: Secondary | ICD-10-CM | POA: Diagnosis not present

## 2018-01-05 DIAGNOSIS — J449 Chronic obstructive pulmonary disease, unspecified: Secondary | ICD-10-CM | POA: Diagnosis not present

## 2018-01-05 DIAGNOSIS — I739 Peripheral vascular disease, unspecified: Secondary | ICD-10-CM | POA: Diagnosis not present

## 2018-01-05 DIAGNOSIS — M5136 Other intervertebral disc degeneration, lumbar region: Secondary | ICD-10-CM | POA: Diagnosis not present

## 2018-01-05 DIAGNOSIS — I10 Essential (primary) hypertension: Secondary | ICD-10-CM | POA: Diagnosis not present

## 2018-01-09 DIAGNOSIS — I10 Essential (primary) hypertension: Secondary | ICD-10-CM | POA: Diagnosis not present

## 2018-01-09 DIAGNOSIS — I739 Peripheral vascular disease, unspecified: Secondary | ICD-10-CM | POA: Diagnosis not present

## 2018-01-09 DIAGNOSIS — J449 Chronic obstructive pulmonary disease, unspecified: Secondary | ICD-10-CM | POA: Diagnosis not present

## 2018-01-09 DIAGNOSIS — F0280 Dementia in other diseases classified elsewhere without behavioral disturbance: Secondary | ICD-10-CM | POA: Diagnosis not present

## 2018-01-09 DIAGNOSIS — M5136 Other intervertebral disc degeneration, lumbar region: Secondary | ICD-10-CM | POA: Diagnosis not present

## 2018-01-09 DIAGNOSIS — G894 Chronic pain syndrome: Secondary | ICD-10-CM | POA: Diagnosis not present

## 2018-01-11 DIAGNOSIS — F0280 Dementia in other diseases classified elsewhere without behavioral disturbance: Secondary | ICD-10-CM | POA: Diagnosis not present

## 2018-01-11 DIAGNOSIS — I10 Essential (primary) hypertension: Secondary | ICD-10-CM | POA: Diagnosis not present

## 2018-01-11 DIAGNOSIS — G894 Chronic pain syndrome: Secondary | ICD-10-CM | POA: Diagnosis not present

## 2018-01-11 DIAGNOSIS — J449 Chronic obstructive pulmonary disease, unspecified: Secondary | ICD-10-CM | POA: Diagnosis not present

## 2018-01-11 DIAGNOSIS — I739 Peripheral vascular disease, unspecified: Secondary | ICD-10-CM | POA: Diagnosis not present

## 2018-01-11 DIAGNOSIS — M5136 Other intervertebral disc degeneration, lumbar region: Secondary | ICD-10-CM | POA: Diagnosis not present

## 2018-01-17 DIAGNOSIS — M5136 Other intervertebral disc degeneration, lumbar region: Secondary | ICD-10-CM | POA: Diagnosis not present

## 2018-01-17 DIAGNOSIS — I10 Essential (primary) hypertension: Secondary | ICD-10-CM | POA: Diagnosis not present

## 2018-01-17 DIAGNOSIS — G894 Chronic pain syndrome: Secondary | ICD-10-CM | POA: Diagnosis not present

## 2018-01-17 DIAGNOSIS — I739 Peripheral vascular disease, unspecified: Secondary | ICD-10-CM | POA: Diagnosis not present

## 2018-01-17 DIAGNOSIS — J449 Chronic obstructive pulmonary disease, unspecified: Secondary | ICD-10-CM | POA: Diagnosis not present

## 2018-01-17 DIAGNOSIS — F0280 Dementia in other diseases classified elsewhere without behavioral disturbance: Secondary | ICD-10-CM | POA: Diagnosis not present

## 2018-01-19 DIAGNOSIS — F0280 Dementia in other diseases classified elsewhere without behavioral disturbance: Secondary | ICD-10-CM | POA: Diagnosis not present

## 2018-01-19 DIAGNOSIS — J449 Chronic obstructive pulmonary disease, unspecified: Secondary | ICD-10-CM | POA: Diagnosis not present

## 2018-01-19 DIAGNOSIS — I10 Essential (primary) hypertension: Secondary | ICD-10-CM | POA: Diagnosis not present

## 2018-01-19 DIAGNOSIS — I739 Peripheral vascular disease, unspecified: Secondary | ICD-10-CM | POA: Diagnosis not present

## 2018-01-19 DIAGNOSIS — G894 Chronic pain syndrome: Secondary | ICD-10-CM | POA: Diagnosis not present

## 2018-01-19 DIAGNOSIS — M5136 Other intervertebral disc degeneration, lumbar region: Secondary | ICD-10-CM | POA: Diagnosis not present

## 2018-01-24 DIAGNOSIS — M5136 Other intervertebral disc degeneration, lumbar region: Secondary | ICD-10-CM | POA: Diagnosis not present

## 2018-01-24 DIAGNOSIS — I10 Essential (primary) hypertension: Secondary | ICD-10-CM | POA: Diagnosis not present

## 2018-01-24 DIAGNOSIS — F0280 Dementia in other diseases classified elsewhere without behavioral disturbance: Secondary | ICD-10-CM | POA: Diagnosis not present

## 2018-01-24 DIAGNOSIS — J449 Chronic obstructive pulmonary disease, unspecified: Secondary | ICD-10-CM | POA: Diagnosis not present

## 2018-01-24 DIAGNOSIS — I739 Peripheral vascular disease, unspecified: Secondary | ICD-10-CM | POA: Diagnosis not present

## 2018-01-24 DIAGNOSIS — G894 Chronic pain syndrome: Secondary | ICD-10-CM | POA: Diagnosis not present

## 2018-01-25 DIAGNOSIS — F0280 Dementia in other diseases classified elsewhere without behavioral disturbance: Secondary | ICD-10-CM | POA: Diagnosis not present

## 2018-01-25 DIAGNOSIS — J449 Chronic obstructive pulmonary disease, unspecified: Secondary | ICD-10-CM | POA: Diagnosis not present

## 2018-01-25 DIAGNOSIS — G894 Chronic pain syndrome: Secondary | ICD-10-CM | POA: Diagnosis not present

## 2018-01-25 DIAGNOSIS — I739 Peripheral vascular disease, unspecified: Secondary | ICD-10-CM | POA: Diagnosis not present

## 2018-01-25 DIAGNOSIS — I10 Essential (primary) hypertension: Secondary | ICD-10-CM | POA: Diagnosis not present

## 2018-01-25 DIAGNOSIS — M5136 Other intervertebral disc degeneration, lumbar region: Secondary | ICD-10-CM | POA: Diagnosis not present

## 2018-01-26 DIAGNOSIS — I10 Essential (primary) hypertension: Secondary | ICD-10-CM | POA: Diagnosis not present

## 2018-01-26 DIAGNOSIS — I739 Peripheral vascular disease, unspecified: Secondary | ICD-10-CM | POA: Diagnosis not present

## 2018-01-26 DIAGNOSIS — G894 Chronic pain syndrome: Secondary | ICD-10-CM | POA: Diagnosis not present

## 2018-01-26 DIAGNOSIS — F0280 Dementia in other diseases classified elsewhere without behavioral disturbance: Secondary | ICD-10-CM | POA: Diagnosis not present

## 2018-01-26 DIAGNOSIS — J449 Chronic obstructive pulmonary disease, unspecified: Secondary | ICD-10-CM | POA: Diagnosis not present

## 2018-01-26 DIAGNOSIS — M5136 Other intervertebral disc degeneration, lumbar region: Secondary | ICD-10-CM | POA: Diagnosis not present

## 2018-01-30 DIAGNOSIS — I739 Peripheral vascular disease, unspecified: Secondary | ICD-10-CM | POA: Diagnosis not present

## 2018-01-30 DIAGNOSIS — F0280 Dementia in other diseases classified elsewhere without behavioral disturbance: Secondary | ICD-10-CM | POA: Diagnosis not present

## 2018-01-30 DIAGNOSIS — M5136 Other intervertebral disc degeneration, lumbar region: Secondary | ICD-10-CM | POA: Diagnosis not present

## 2018-01-30 DIAGNOSIS — J449 Chronic obstructive pulmonary disease, unspecified: Secondary | ICD-10-CM | POA: Diagnosis not present

## 2018-01-30 DIAGNOSIS — I10 Essential (primary) hypertension: Secondary | ICD-10-CM | POA: Diagnosis not present

## 2018-01-30 DIAGNOSIS — G894 Chronic pain syndrome: Secondary | ICD-10-CM | POA: Diagnosis not present

## 2018-01-31 DIAGNOSIS — M5136 Other intervertebral disc degeneration, lumbar region: Secondary | ICD-10-CM | POA: Diagnosis not present

## 2018-01-31 DIAGNOSIS — F0280 Dementia in other diseases classified elsewhere without behavioral disturbance: Secondary | ICD-10-CM | POA: Diagnosis not present

## 2018-01-31 DIAGNOSIS — J449 Chronic obstructive pulmonary disease, unspecified: Secondary | ICD-10-CM | POA: Diagnosis not present

## 2018-01-31 DIAGNOSIS — I10 Essential (primary) hypertension: Secondary | ICD-10-CM | POA: Diagnosis not present

## 2018-01-31 DIAGNOSIS — I739 Peripheral vascular disease, unspecified: Secondary | ICD-10-CM | POA: Diagnosis not present

## 2018-01-31 DIAGNOSIS — G894 Chronic pain syndrome: Secondary | ICD-10-CM | POA: Diagnosis not present

## 2018-02-01 DIAGNOSIS — I10 Essential (primary) hypertension: Secondary | ICD-10-CM | POA: Diagnosis not present

## 2018-02-01 DIAGNOSIS — F0280 Dementia in other diseases classified elsewhere without behavioral disturbance: Secondary | ICD-10-CM | POA: Diagnosis not present

## 2018-02-01 DIAGNOSIS — J449 Chronic obstructive pulmonary disease, unspecified: Secondary | ICD-10-CM | POA: Diagnosis not present

## 2018-02-01 DIAGNOSIS — G894 Chronic pain syndrome: Secondary | ICD-10-CM | POA: Diagnosis not present

## 2018-02-01 DIAGNOSIS — M5136 Other intervertebral disc degeneration, lumbar region: Secondary | ICD-10-CM | POA: Diagnosis not present

## 2018-02-01 DIAGNOSIS — I739 Peripheral vascular disease, unspecified: Secondary | ICD-10-CM | POA: Diagnosis not present

## 2018-02-06 DIAGNOSIS — F0280 Dementia in other diseases classified elsewhere without behavioral disturbance: Secondary | ICD-10-CM | POA: Diagnosis not present

## 2018-02-06 DIAGNOSIS — J449 Chronic obstructive pulmonary disease, unspecified: Secondary | ICD-10-CM | POA: Diagnosis not present

## 2018-02-06 DIAGNOSIS — M5136 Other intervertebral disc degeneration, lumbar region: Secondary | ICD-10-CM | POA: Diagnosis not present

## 2018-02-06 DIAGNOSIS — I739 Peripheral vascular disease, unspecified: Secondary | ICD-10-CM | POA: Diagnosis not present

## 2018-02-06 DIAGNOSIS — I10 Essential (primary) hypertension: Secondary | ICD-10-CM | POA: Diagnosis not present

## 2018-02-06 DIAGNOSIS — G894 Chronic pain syndrome: Secondary | ICD-10-CM | POA: Diagnosis not present

## 2018-02-07 DIAGNOSIS — F0280 Dementia in other diseases classified elsewhere without behavioral disturbance: Secondary | ICD-10-CM | POA: Diagnosis not present

## 2018-02-07 DIAGNOSIS — G894 Chronic pain syndrome: Secondary | ICD-10-CM | POA: Diagnosis not present

## 2018-02-07 DIAGNOSIS — M5136 Other intervertebral disc degeneration, lumbar region: Secondary | ICD-10-CM | POA: Diagnosis not present

## 2018-02-07 DIAGNOSIS — I10 Essential (primary) hypertension: Secondary | ICD-10-CM | POA: Diagnosis not present

## 2018-02-07 DIAGNOSIS — J449 Chronic obstructive pulmonary disease, unspecified: Secondary | ICD-10-CM | POA: Diagnosis not present

## 2018-02-07 DIAGNOSIS — I739 Peripheral vascular disease, unspecified: Secondary | ICD-10-CM | POA: Diagnosis not present

## 2018-02-08 DIAGNOSIS — J449 Chronic obstructive pulmonary disease, unspecified: Secondary | ICD-10-CM | POA: Diagnosis not present

## 2018-02-08 DIAGNOSIS — I10 Essential (primary) hypertension: Secondary | ICD-10-CM | POA: Diagnosis not present

## 2018-02-08 DIAGNOSIS — F0280 Dementia in other diseases classified elsewhere without behavioral disturbance: Secondary | ICD-10-CM | POA: Diagnosis not present

## 2018-02-08 DIAGNOSIS — G894 Chronic pain syndrome: Secondary | ICD-10-CM | POA: Diagnosis not present

## 2018-02-08 DIAGNOSIS — M5136 Other intervertebral disc degeneration, lumbar region: Secondary | ICD-10-CM | POA: Diagnosis not present

## 2018-02-08 DIAGNOSIS — I739 Peripheral vascular disease, unspecified: Secondary | ICD-10-CM | POA: Diagnosis not present

## 2018-02-13 DIAGNOSIS — I739 Peripheral vascular disease, unspecified: Secondary | ICD-10-CM | POA: Diagnosis not present

## 2018-02-13 DIAGNOSIS — M5136 Other intervertebral disc degeneration, lumbar region: Secondary | ICD-10-CM | POA: Diagnosis not present

## 2018-02-13 DIAGNOSIS — G894 Chronic pain syndrome: Secondary | ICD-10-CM | POA: Diagnosis not present

## 2018-02-13 DIAGNOSIS — I10 Essential (primary) hypertension: Secondary | ICD-10-CM | POA: Diagnosis not present

## 2018-02-13 DIAGNOSIS — F0280 Dementia in other diseases classified elsewhere without behavioral disturbance: Secondary | ICD-10-CM | POA: Diagnosis not present

## 2018-02-13 DIAGNOSIS — J449 Chronic obstructive pulmonary disease, unspecified: Secondary | ICD-10-CM | POA: Diagnosis not present

## 2018-02-14 DIAGNOSIS — M5136 Other intervertebral disc degeneration, lumbar region: Secondary | ICD-10-CM | POA: Diagnosis not present

## 2018-02-14 DIAGNOSIS — G894 Chronic pain syndrome: Secondary | ICD-10-CM | POA: Diagnosis not present

## 2018-02-14 DIAGNOSIS — I10 Essential (primary) hypertension: Secondary | ICD-10-CM | POA: Diagnosis not present

## 2018-02-14 DIAGNOSIS — I739 Peripheral vascular disease, unspecified: Secondary | ICD-10-CM | POA: Diagnosis not present

## 2018-02-14 DIAGNOSIS — F0280 Dementia in other diseases classified elsewhere without behavioral disturbance: Secondary | ICD-10-CM | POA: Diagnosis not present

## 2018-02-14 DIAGNOSIS — J449 Chronic obstructive pulmonary disease, unspecified: Secondary | ICD-10-CM | POA: Diagnosis not present

## 2018-02-15 DIAGNOSIS — F0280 Dementia in other diseases classified elsewhere without behavioral disturbance: Secondary | ICD-10-CM | POA: Diagnosis not present

## 2018-02-15 DIAGNOSIS — G894 Chronic pain syndrome: Secondary | ICD-10-CM | POA: Diagnosis not present

## 2018-02-15 DIAGNOSIS — J449 Chronic obstructive pulmonary disease, unspecified: Secondary | ICD-10-CM | POA: Diagnosis not present

## 2018-02-15 DIAGNOSIS — M5136 Other intervertebral disc degeneration, lumbar region: Secondary | ICD-10-CM | POA: Diagnosis not present

## 2018-02-15 DIAGNOSIS — I10 Essential (primary) hypertension: Secondary | ICD-10-CM | POA: Diagnosis not present

## 2018-02-15 DIAGNOSIS — I739 Peripheral vascular disease, unspecified: Secondary | ICD-10-CM | POA: Diagnosis not present

## 2018-02-16 DIAGNOSIS — I10 Essential (primary) hypertension: Secondary | ICD-10-CM | POA: Diagnosis not present

## 2018-02-16 DIAGNOSIS — J449 Chronic obstructive pulmonary disease, unspecified: Secondary | ICD-10-CM | POA: Diagnosis not present

## 2018-02-16 DIAGNOSIS — F0280 Dementia in other diseases classified elsewhere without behavioral disturbance: Secondary | ICD-10-CM | POA: Diagnosis not present

## 2018-02-16 DIAGNOSIS — M5136 Other intervertebral disc degeneration, lumbar region: Secondary | ICD-10-CM | POA: Diagnosis not present

## 2018-02-16 DIAGNOSIS — G894 Chronic pain syndrome: Secondary | ICD-10-CM | POA: Diagnosis not present

## 2018-02-16 DIAGNOSIS — I739 Peripheral vascular disease, unspecified: Secondary | ICD-10-CM | POA: Diagnosis not present

## 2018-03-26 DIAGNOSIS — Z23 Encounter for immunization: Secondary | ICD-10-CM | POA: Diagnosis not present

## 2018-03-26 DIAGNOSIS — E785 Hyperlipidemia, unspecified: Secondary | ICD-10-CM | POA: Diagnosis not present

## 2018-03-26 DIAGNOSIS — F3341 Major depressive disorder, recurrent, in partial remission: Secondary | ICD-10-CM | POA: Diagnosis not present

## 2018-03-26 DIAGNOSIS — M5136 Other intervertebral disc degeneration, lumbar region: Secondary | ICD-10-CM | POA: Diagnosis not present

## 2018-03-26 DIAGNOSIS — G894 Chronic pain syndrome: Secondary | ICD-10-CM | POA: Diagnosis not present

## 2018-03-26 DIAGNOSIS — I10 Essential (primary) hypertension: Secondary | ICD-10-CM | POA: Diagnosis not present

## 2018-03-26 DIAGNOSIS — F0390 Unspecified dementia without behavioral disturbance: Secondary | ICD-10-CM | POA: Diagnosis not present

## 2018-03-26 DIAGNOSIS — R269 Unspecified abnormalities of gait and mobility: Secondary | ICD-10-CM | POA: Diagnosis not present

## 2018-06-06 ENCOUNTER — Institutional Professional Consult (permissible substitution): Payer: Medicare Other | Admitting: Neurology

## 2018-06-07 ENCOUNTER — Encounter: Payer: Self-pay | Admitting: Neurology

## 2018-06-07 ENCOUNTER — Ambulatory Visit (INDEPENDENT_AMBULATORY_CARE_PROVIDER_SITE_OTHER): Payer: Medicare Other | Admitting: Neurology

## 2018-06-07 ENCOUNTER — Telehealth: Payer: Self-pay | Admitting: Neurology

## 2018-06-07 VITALS — BP 122/84 | HR 61 | Ht 62.0 in | Wt 129.0 lb

## 2018-06-07 DIAGNOSIS — R413 Other amnesia: Secondary | ICD-10-CM | POA: Diagnosis not present

## 2018-06-07 DIAGNOSIS — R269 Unspecified abnormalities of gait and mobility: Secondary | ICD-10-CM

## 2018-06-07 MED ORDER — DIVALPROEX SODIUM 250 MG PO DR TAB: 250.0000 mg | DELAYED_RELEASE_TABLET | Freq: Two times a day (BID) | ORAL | 3 refills | Status: DC

## 2018-06-07 MED ORDER — ESCITALOPRAM OXALATE 10 MG PO TABS: 10.0000 mg | ORAL_TABLET | Freq: Every day | ORAL | 1 refills | Status: DC

## 2018-06-07 NOTE — Telephone Encounter (Signed)
Medicare/BCBS supp order sent to GI pt daughter on dpr is aware.

## 2018-06-07 NOTE — Progress Notes (Signed)
Reason for visit: Dementia, myoclonus, gait disturbance  Jasmine Carrillo is an 78 y.o. female  History of present illness:  Jasmine Carrillo is a 78 year old right-handed white female with a history of a progressive dementing illness over the last decade.  The patient has developed some issues with frequent jerking and myoclonic movements of the arms and legs.  The daughter has noted that when these events occur while standing, she may fall.  The patient does not topple, she collapses.  The patient is on hydrocodone on a regular basis for her low back, she is on Aricept and Namenda and she takes Lexapro.  The patient last had falls on 20 April 2018 and on 02 May 2018.  The patient has not sustained significant injury with these falls fortunately.  The patient comes to the office today for an evaluation.  CT of the head was ordered on her last visit, this was never done.  Past Medical History:  Diagnosis Date  . Anxiety   . Depression     Past Surgical History:  Procedure Laterality Date  . CORONARY ARTERY BYPASS GRAFT    . NECK SURGERY     Neck bone replacement     No family history on file.  Social history:  reports that she quit smoking about 11 years ago. She has never used smokeless tobacco. She reports current alcohol use. She reports that she does not use drugs.   No Known Allergies  Medications:  Prior to Admission medications   Medication Sig Start Date End Date Taking? Authorizing Provider  ASPIRIN 81 PO Take 1 tablet by mouth daily.   Yes [provider]  CALCIUM PO Take 1 tablet by mouth daily.   Yes [provider]  carvedilol (COREG) 12.5 MG tablet Take 1 tablet by mouth 2 (two) times daily. 03/23/11  Yes [provider]  donepezil (ARICEPT) 10 MG tablet Take 1 tablet by mouth 2 (two) times daily. 03/23/11  Yes [provider]  escitalopram (LEXAPRO) 20 MG tablet Take 20 mg by mouth daily.   Yes [provider]    HYDROcodone-acetaminophen (NORCO) 7.5-325 MG tablet Take 1 tablet by mouth every 6 (six) hours as needed for moderate pain.   Yes [provider]  memantine (NAMENDA) 10 MG tablet Take 1 tablet by mouth 2 (two) times daily. 02/24/15  Yes [provider]  Multiple Vitamins-Minerals (MULTIVITAMIN PO) Take 1 tablet by mouth daily.   Yes [provider]  simvastatin (ZOCOR) 80 MG tablet Take 0.5 tablets by mouth daily. 03/23/11  Yes [provider]  Tretinoin, Facial Wrinkles, (TRETINOIN, EMOLLIENT,) 0.05 % CREA Take 1 application by mouth as needed. 06/20/11  Yes [provider]    ROS:  Out of a complete 14 system review of symptoms, the patient complains only of the following symptoms, and all other reviewed systems are negative.  Fatigue, swelling in the legs Blurred vision Snoring Feeling hot Aching muscles Memory loss, confusion, headache, weakness, slurred speech Depression Sleepiness, snoring  Blood pressure 122/84, pulse 61, height 5\' 2"  (1.575 m), weight 129 lb (58.5 kg), SpO2 95 %.  Physical Exam  General: The patient is alert and cooperative at the time of the examination.  Skin: No significant peripheral edema is noted.   Neurologic Exam  Mental status: The patient is alert and cooperative, she is minimally verbal, she has the ability to follow commands.   Cranial nerves: Facial symmetry is present. Speech is limited, not dysarthric. Extraocular  movements are full. Visual fields are full.  Motor: The patient has good strength in all 4 extremities.  Sensory examination: Soft touch sensation is symmetric on the face, arms, and legs.  Coordination: The patient has good finger-nose-finger and heel-to-shin bilaterally, some apraxia with use of the extremities is noted..  Gait and station: The patient has a slightly wide-based, shuffling gait.  Occasional episodes of myoclonus are noted with the upper extremities with use of  the arms.  Reflexes: Deep tendon reflexes are symmetric.   Assessment/Plan:  1.  Severe dementia  2.  Gait disturbance  3.  Myoclonus  The daughter indicates that the source of the falls appears to be related to the myoclonus associated with a sudden drop, not with tripping or toppling.  The patient will be reduced on the Lexapro taking 10 mg daily, Depakote will be added taking 250 mg twice daily.  The patient will undergo a CT scan of the brain, she will follow-up in 4 months.  Carvedilol is one of the medications that can cause myoclonus.  If the patient is not improved with the above medication adjustments, switching to another blood pressure medication may be of some benefit. Alzheimer's disease itself may cause myoclonus.  Marlan Palau MD 06/07/2018 10:56 AM  [facility] [address] [place], [place] [address]  Phone [phone] Fax [phone]

## 2018-06-07 NOTE — Patient Instructions (Signed)
We will reduce the lexapro to 10 mg daily.  We will start depakote 250 mg twice a day for the jerking.   Depakote (valproic acid) is a seizure medication that also has an FDA approval for migraine headache. The most common potential side effects of this medication include weight gain, tremor, or possible stomach upset. This medication can potentially cause liver problems. If confusion is noted on this medication, contact our office immediately.

## 2018-06-15 ENCOUNTER — Ambulatory Visit
Admission: RE | Admit: 2018-06-15 | Discharge: 2018-06-15 | Disposition: A | Discharge status: Home / Self Care | Payer: Medicare Other | Source: Ambulatory Visit | Attending: Neurology | Admitting: Neurology

## 2018-06-15 DIAGNOSIS — R413 Other amnesia: Secondary | ICD-10-CM | POA: Diagnosis not present

## 2018-06-15 DIAGNOSIS — R269 Unspecified abnormalities of gait and mobility: Secondary | ICD-10-CM

## 2018-06-16 ENCOUNTER — Telehealth: Payer: Self-pay | Admitting: Neurology

## 2018-06-16 MED ORDER — DIVALPROEX SODIUM 250 MG PO DR TAB: 250.0000 mg | DELAYED_RELEASE_TABLET | Freq: Every day | ORAL | 3 refills | Status: DC

## 2018-06-16 NOTE — Telephone Encounter (Signed)
  I called the daughter. The CT of the head shows a lot of atrophy of the brain. The pineal gland is calcified, slightly larger than unusual, no evidence of hydrocephalus. No severe SVD. The patient is on depakote, the daughter indicates that initially she seemed to do better with the myoclonus and her speech was better. Now, she is freezing with the feet, falling again, and is more irritable. We will decrease the depakote to 250 mg a day and possibly stop the medication if she continues to do poorly.  CT head 06/15/18:  IMPRESSION: Abnormal CT scan of the head showing age advanced generalized cerebral cortical atrophy and mild changes of chronic microvascular ischemia.  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

## 2018-07-31 DIAGNOSIS — R4181 Age-related cognitive decline: Secondary | ICD-10-CM | POA: Diagnosis not present

## 2018-07-31 DIAGNOSIS — I10 Essential (primary) hypertension: Secondary | ICD-10-CM | POA: Diagnosis not present

## 2018-07-31 DIAGNOSIS — E2839 Other primary ovarian failure: Secondary | ICD-10-CM | POA: Diagnosis not present

## 2018-07-31 DIAGNOSIS — H538 Other visual disturbances: Secondary | ICD-10-CM | POA: Diagnosis not present

## 2018-07-31 DIAGNOSIS — Z961 Presence of intraocular lens: Secondary | ICD-10-CM | POA: Diagnosis not present

## 2018-07-31 DIAGNOSIS — Z23 Encounter for immunization: Secondary | ICD-10-CM | POA: Diagnosis not present

## 2018-07-31 DIAGNOSIS — F0390 Unspecified dementia without behavioral disturbance: Secondary | ICD-10-CM | POA: Diagnosis not present

## 2018-07-31 DIAGNOSIS — E785 Hyperlipidemia, unspecified: Secondary | ICD-10-CM | POA: Diagnosis not present

## 2018-07-31 DIAGNOSIS — M5136 Other intervertebral disc degeneration, lumbar region: Secondary | ICD-10-CM | POA: Diagnosis not present

## 2018-07-31 DIAGNOSIS — Z0000 Encounter for general adult medical examination without abnormal findings: Secondary | ICD-10-CM | POA: Diagnosis not present

## 2018-07-31 DIAGNOSIS — I2510 Atherosclerotic heart disease of native coronary artery without angina pectoris: Secondary | ICD-10-CM | POA: Diagnosis not present

## 2018-07-31 DIAGNOSIS — F3341 Major depressive disorder, recurrent, in partial remission: Secondary | ICD-10-CM | POA: Diagnosis not present

## 2018-07-31 DIAGNOSIS — L908 Other atrophic disorders of skin: Secondary | ICD-10-CM | POA: Diagnosis not present

## 2018-07-31 DIAGNOSIS — L989 Disorder of the skin and subcutaneous tissue, unspecified: Secondary | ICD-10-CM | POA: Diagnosis not present

## 2018-08-08 ENCOUNTER — Emergency Department (HOSPITAL_COMMUNITY): Payer: Medicare Other

## 2018-08-08 ENCOUNTER — Encounter (HOSPITAL_COMMUNITY): Payer: Self-pay

## 2018-08-08 ENCOUNTER — Other Ambulatory Visit: Payer: Self-pay

## 2018-08-08 ENCOUNTER — Emergency Department (HOSPITAL_COMMUNITY)
Admission: EM | Admit: 2018-08-08 | Discharge: 2018-08-08 | Disposition: A | Discharge status: Home / Self Care | Payer: Medicare Other | Attending: Emergency Medicine | Admitting: Emergency Medicine

## 2018-08-08 DIAGNOSIS — S0003XA Contusion of scalp, initial encounter: Secondary | ICD-10-CM

## 2018-08-08 DIAGNOSIS — Z87891 Personal history of nicotine dependence: Secondary | ICD-10-CM | POA: Diagnosis not present

## 2018-08-08 DIAGNOSIS — W19XXXA Unspecified fall, initial encounter: Secondary | ICD-10-CM | POA: Diagnosis not present

## 2018-08-08 DIAGNOSIS — Z79899 Other long term (current) drug therapy: Secondary | ICD-10-CM | POA: Insufficient documentation

## 2018-08-08 DIAGNOSIS — Y92002 Bathroom of unspecified non-institutional (private) residence single-family (private) house as the place of occurrence of the external cause: Secondary | ICD-10-CM | POA: Diagnosis not present

## 2018-08-08 DIAGNOSIS — Z951 Presence of aortocoronary bypass graft: Secondary | ICD-10-CM | POA: Insufficient documentation

## 2018-08-08 DIAGNOSIS — Y93E8 Activity, other personal hygiene: Secondary | ICD-10-CM | POA: Diagnosis not present

## 2018-08-08 DIAGNOSIS — S199XXA Unspecified injury of neck, initial encounter: Secondary | ICD-10-CM | POA: Diagnosis not present

## 2018-08-08 DIAGNOSIS — F0390 Unspecified dementia without behavioral disturbance: Secondary | ICD-10-CM | POA: Diagnosis not present

## 2018-08-08 DIAGNOSIS — Y999 Unspecified external cause status: Secondary | ICD-10-CM | POA: Diagnosis not present

## 2018-08-08 DIAGNOSIS — Z7982 Long term (current) use of aspirin: Secondary | ICD-10-CM | POA: Insufficient documentation

## 2018-08-08 DIAGNOSIS — S0990XA Unspecified injury of head, initial encounter: Secondary | ICD-10-CM

## 2018-08-08 DIAGNOSIS — Y92009 Unspecified place in unspecified non-institutional (private) residence as the place of occurrence of the external cause: Secondary | ICD-10-CM

## 2018-08-08 LAB — URINALYSIS, ROUTINE W REFLEX MICROSCOPIC
Bilirubin Urine: NEGATIVE
Glucose, UA: NEGATIVE mg/dL
HGB URINE DIPSTICK: NEGATIVE
Ketones, ur: NEGATIVE mg/dL
LEUKOCYTE UA: NEGATIVE
Nitrite: POSITIVE — AB
Protein, ur: NEGATIVE mg/dL
Specific Gravity, Urine: 1.011 (ref 1.005–1.030)
pH: 6 (ref 5.0–8.0)

## 2018-08-08 NOTE — ED Triage Notes (Signed)
Pt was getting up this AM to go to the bathroom and fell. Unwitnessed fall at approximately 0530- daughter heard thump. Pt fell backwards, injuring her head. Raised area noted.  Per daughter, pt is on blood thinners.

## 2018-08-08 NOTE — Discharge Instructions (Addendum)
You were seen in the emergency department after a fall this morning in which she struck your head.  You had a CAT scan of your head and cervical spine that did not show any significant findings.  You should use Tylenol as needed for pain and apply ice to the affected area.  Please return if any worsening symptoms.

## 2018-08-08 NOTE — ED Provider Notes (Signed)
[facility]-EMERGENCY DEPT Provider Note   CSN: [id number] Arrival date & time: 08/08/18  2035    History   Chief Complaint Chief Complaint  Patient presents with   Fall   Head Injury    HPI Jasmine Carrillo is a 78 y.o. female.  Level 5 caveat secondary to dementia.  She had an unwitnessed fall this morning around 5:30 AM while going to the bathroom.  Her daughter heard her hit the floor and found her immediately, unlikely LOC.  Patient herself is complaining of some posterior head pain.  She has a history of some chronic back pain.  Per the daughter the patient has been awake and alert and acting herself since the fall.  She seems to be at her baseline.  She applied some ice to the area and gave her her morning meds including some hydrocodone that she uses for chronic back problems.  No vomiting.     The history is provided by the patient and a relative.  Fall  This is a new problem. The current episode started 3 to 5 hours ago. The problem has been resolved. Associated symptoms include headaches. Pertinent negatives include no chest pain, no abdominal pain and no shortness of breath. Nothing aggravates the symptoms. Nothing relieves the symptoms. She has tried acetaminophen for the symptoms. The treatment provided mild relief.  Head Injury  Location:  Occipital Mechanism of injury: fall   Fall:    Fall occurred:  Standing   Point of impact:  Head   Entrapped after fall: no   Pain details:    Quality:  Unable to specify Associated symptoms: headache   Risk factors: being elderly     Past Medical History:  Diagnosis Date   Anxiety    Depression     There are no active problems to display for this patient.   Past Surgical History:  Procedure Laterality Date   CORONARY ARTERY BYPASS GRAFT     NECK SURGERY     Neck bone replacement      OB History   No obstetric history on file.      Home Medications    Prior to Admission medications     Medication Sig Start Date End Date Taking? Authorizing Provider  ASPIRIN 81 PO Take 1 tablet by mouth daily.    [provider]  CALCIUM PO Take 1 tablet by mouth daily.    [provider]  carvedilol (COREG) 12.5 MG tablet Take 1 tablet by mouth 2 (two) times daily. 03/23/11   [provider]  divalproex (DEPAKOTE) 250 MG DR tablet Take 1 tablet (250 mg total) by mouth at bedtime. 06/16/18   York Spaniel, MD  donepezil (ARICEPT) 10 MG tablet Take 1 tablet by mouth 2 (two) times daily. 03/23/11   [provider]  escitalopram (LEXAPRO) 10 MG tablet Take 1 tablet (10 mg total) by mouth daily. 06/07/18   York Spaniel, MD  HYDROcodone-acetaminophen (NORCO) 7.5-325 MG tablet Take 1 tablet by mouth every 6 (six) hours as needed for moderate pain.    [provider]  memantine (NAMENDA) 10 MG tablet Take 1 tablet by mouth 2 (two) times daily. 02/24/15   [provider]  Multiple Vitamins-Minerals (MULTIVITAMIN PO) Take 1 tablet by mouth daily.    [provider]  simvastatin (ZOCOR) 80 MG tablet Take 0.5 tablets by mouth daily. 03/23/11   [provider]  Tretinoin, Facial Wrinkles, (TRETINOIN, EMOLLIENT,) 0.05 % CREA Take 1  application by mouth as needed. 06/20/11   [provider]    Family History No family history on file.  Social History Social History   Tobacco Use   Smoking status: Former Smoker    Last attempt to quit: 07/29/2006    Years since quitting: 12.0   Smokeless tobacco: Never Used  Substance Use Topics   Alcohol use: Yes    Frequency: Never    Comment: wine 1/2 twice a week    Drug use: No     Allergies   Patient has no known allergies.   Review of Systems Review of Systems  Unable to perform ROS: Dementia  Respiratory: Negative for shortness of breath.   Cardiovascular: Negative for chest pain.  Gastrointestinal: Negative for abdominal pain.  Musculoskeletal: Positive for  back pain.  Neurological: Positive for headaches.     Physical Exam Updated Vital Signs BP (!) 147/64 (BP Location: Left Arm)    Pulse 61    Temp 97.8 F (36.6 C) (Oral)    Resp 12    Ht 5\' 2"  (1.575 m)    Wt 61.7 kg    SpO2 96%    BMI 24.87 kg/m   Physical Exam Vitals signs and nursing note reviewed.  Constitutional:      General: She is not in acute distress.    Appearance: She is well-developed.  HENT:     Head: Normocephalic.     Comments: She is approximately 3 x 3 cm hematoma on her right occipital area. Eyes:     Conjunctiva/sclera: Conjunctivae normal.  Neck:     Musculoskeletal: Normal range of motion and neck supple. No muscular tenderness.  Cardiovascular:     Rate and Rhythm: Normal rate and regular rhythm.     Pulses: Normal pulses.     Heart sounds: No murmur.  Pulmonary:     Effort: Pulmonary effort is normal. No respiratory distress.     Breath sounds: Normal breath sounds.  Abdominal:     Palpations: Abdomen is soft.     Tenderness: There is no abdominal tenderness.  Musculoskeletal: Normal range of motion.        General: No tenderness or signs of injury.     Right lower leg: No edema.     Left lower leg: No edema.  Skin:    General: Skin is warm and dry.     Capillary Refill: Capillary refill takes less than 2 seconds.  Neurological:     General: No focal deficit present.     Mental Status: She is alert. Mental status is at baseline. She is disoriented.     Motor: No weakness.      ED Treatments / Results  Labs (all labs ordered are listed, but only abnormal results are displayed) Labs Reviewed  URINALYSIS, ROUTINE W REFLEX MICROSCOPIC - Abnormal; Notable for the following components:      Result Value   Nitrite POSITIVE (*)    Bacteria, UA MANY (*)    All other components within normal limits  URINE CULTURE    EKG EKG Interpretation  Date/Time:  Wednesday August 08 2018 08:44:43 EDT Ventricular Rate:  56 PR Interval:    QRS  Duration: 151 QT Interval:  511 QTC Calculation: 494 R Axis:   15 Text Interpretation:  Sinus rhythm Right bundle branch block Baseline wander in lead(s) V2 No old tracing to compare Confirmed by Meridee Score [phone]) on 08/08/2018 8:48:36 AM   Radiology Ct Head Wo Contrast  Result  Date: 08/08/2018 CLINICAL DATA:  Fall this a.m. History of dementia. EXAM: CT HEAD WITHOUT CONTRAST CT CERVICAL SPINE WITHOUT CONTRAST TECHNIQUE: Multidetector CT imaging of the head and cervical spine was performed following the standard protocol without intravenous contrast. Multiplanar CT image reconstructions of the cervical spine were also generated. COMPARISON:  06/15/2018. FINDINGS: CT HEAD FINDINGS Brain: No evidence for acute infarction, hemorrhage, mass lesion, or extra-axial fluid. Generalized advanced atrophy, hydrocephalus ex vacuo. Prior LEFT hemisphere infarct. Hypoattenuation of white matter, likely small vessel disease. Vascular: Calcification of the cavernous internal carotid arteries consistent with cerebrovascular atherosclerotic disease. No signs of intracranial large vessel occlusion. Skull: No skull fracture. RIGHT-sided soft tissue swelling. Sinuses/Orbits: No acute findings. Chronic RIGHT maxillary sinus disease. Other: No significant mastoid fluid. CT CERVICAL SPINE FINDINGS Alignment: Straightening, slight reversal, of the normal cervical lordosis. Previous non-instrumented C5-6 fusion appears solid. Degenerative anterolisthesis 2 mm C4-C5. Skull base and vertebrae: No acute fracture. No primary bone lesion or focal pathologic process. Soft tissues and spinal canal: No intraspinal hematoma. No visible prevertebral soft tissue swelling. Disc levels: Solid C5-6 arthrodesis. Adjacent segment disease C6-7 with disc space narrowing. Degenerative anterolisthesis C4-C5. Multilevel facet arthropathy. Upper chest: No mass or pneumothorax. Aortic atherosclerosis. Suspected COPD. Other: None. Compared with  January study, similar appearance. IMPRESSION: 1. No skull fracture or intracranial hemorrhage. Chronic changes as described. Advanced atrophy and small vessel disease. RIGHT-sided posterior scalp hematoma. 2. No cervical spine fracture or traumatic subluxation. Multilevel spondylosis. Electronically Signed   By: Elsie StainJohn T Curnes M.D.   On: 08/08/2018 10:35   Ct Cervical Spine Wo Contrast  Result Date: 08/08/2018 CLINICAL DATA:  Fall this a.m. History of dementia. EXAM: CT HEAD WITHOUT CONTRAST CT CERVICAL SPINE WITHOUT CONTRAST TECHNIQUE: Multidetector CT imaging of the head and cervical spine was performed following the standard protocol without intravenous contrast. Multiplanar CT image reconstructions of the cervical spine were also generated. COMPARISON:  06/15/2018. FINDINGS: CT HEAD FINDINGS Brain: No evidence for acute infarction, hemorrhage, mass lesion, or extra-axial fluid. Generalized advanced atrophy, hydrocephalus ex vacuo. Prior LEFT hemisphere infarct. Hypoattenuation of white matter, likely small vessel disease. Vascular: Calcification of the cavernous internal carotid arteries consistent with cerebrovascular atherosclerotic disease. No signs of intracranial large vessel occlusion. Skull: No skull fracture. RIGHT-sided soft tissue swelling. Sinuses/Orbits: No acute findings. Chronic RIGHT maxillary sinus disease. Other: No significant mastoid fluid. CT CERVICAL SPINE FINDINGS Alignment: Straightening, slight reversal, of the normal cervical lordosis. Previous non-instrumented C5-6 fusion appears solid. Degenerative anterolisthesis 2 mm C4-C5. Skull base and vertebrae: No acute fracture. No primary bone lesion or focal pathologic process. Soft tissues and spinal canal: No intraspinal hematoma. No visible prevertebral soft tissue swelling. Disc levels: Solid C5-6 arthrodesis. Adjacent segment disease C6-7 with disc space narrowing. Degenerative anterolisthesis C4-C5. Multilevel facet arthropathy.  Upper chest: No mass or pneumothorax. Aortic atherosclerosis. Suspected COPD. Other: None. Compared with January study, similar appearance. IMPRESSION: 1. No skull fracture or intracranial hemorrhage. Chronic changes as described. Advanced atrophy and small vessel disease. RIGHT-sided posterior scalp hematoma. 2. No cervical spine fracture or traumatic subluxation. Multilevel spondylosis. Electronically Signed   By: Elsie StainJohn T Curnes M.D.   On: 08/08/2018 10:35    Procedures Procedures (including critical care time)  Medications Ordered in ED Medications - No data to display   Initial Impression / Assessment and Plan / ED Course  I have reviewed the triage vital signs and the nursing notes.  Pertinent labs & imaging results that were available during my care of the  patient were reviewed by me and considered in my medical decision making (see chart for details).  Clinical Course as of Aug 07 1801  Wed Aug 08, 2018  716 78 year old female here after fall striking her head.  Patient was thought to be on blood thinners but on review of her medication list does not look like she is on anything other than aspirin.  She has a small scalp hematoma posteriorly.  She is getting a head and C-spine CT.  Daughter states she is at her baseline mental status and functionality.   [MB]  1106 Patient CT head and C-spine do not show any obvious fractures.  Will check a urine as the patient was able to go.  Likely will discharge back to family.   [MB]    Clinical Course User Index [MB] Terrilee Files, MD        Final Clinical Impressions(s) / ED Diagnoses   Final diagnoses:  Injury of head, initial encounter  Fall in home, initial encounter  Hematoma of scalp, initial encounter    ED Discharge Orders    None       Terrilee Files, MD 08/08/18 [phone]

## 2018-08-08 NOTE — ED Notes (Signed)
Patient being escorted to restroom via [employer] to restroom.

## 2018-08-08 NOTE — ED Notes (Signed)
Bed: WA18 Expected date:  Expected time:  Means of arrival:  Comments: Triage 1 

## 2018-08-10 LAB — URINE CULTURE
Culture: >=100000 — AB
Special Requests: NORMAL

## 2018-08-12 ENCOUNTER — Telehealth: Payer: Self-pay | Admitting: Emergency Medicine

## 2018-08-12 NOTE — Telephone Encounter (Signed)
Post ED Visit - Positive Culture Follow-up  Culture report reviewed by antimicrobial stewardship pharmacist: [facility] []  Enzo Bi, Pharm.D. []  Celedonio Miyamoto, Pharm.D., BCPS AQ-ID []  Garvin Fila, Pharm.D., BCPS []  Georgina Pillion, Pharm.D., BCPS []  Grant, [place].D., BCPS, AAHIVP []  Estella Husk, Pharm.D., BCPS, AAHIVP []  Lysle Pearl, PharmD, BCPS []  Phillips Climes, PharmD, BCPS []  Agapito Games, PharmD, BCPS []  Verlan Friends, PharmD []  Mervyn Gay, PharmD, BCPS []  Vinnie Level, PharmD  [facility] []  Len Childs, PharmD []  Greer Pickerel, PharmD [x]  Adalberto Cole, PharmD []  Perlie Gold, Rph []  Lonell Face) Jean Rosenthal, PharmD []  Earl Many, PharmD []  Junita Push, PharmD []  Dorna Leitz, PharmD []  Terrilee Files, PharmD []  Lynann Beaver, PharmD []  Keturah Barre, PharmD []  Loralee Pacas, PharmD []  Bernadene Person, PharmD   Positive urine culture Asymptomatic bacteria, no further patient follow-up is required at this time.  Carollee Herter Marvette Schamp 08/12/2018, 3:04 PM

## 2018-08-29 ENCOUNTER — Telehealth: Payer: Self-pay

## 2018-08-29 NOTE — Telephone Encounter (Signed)
Attempted to contact pt for COVID pre-screening and to rescheduled if pt is not experiencing any cardiac symptoms. Left message to call back.

## 2018-08-30 NOTE — Telephone Encounter (Signed)
Left message to call back  

## 2018-08-31 NOTE — Telephone Encounter (Signed)
   Primary Cardiologist:  No primary care provider on file.   Patient contacted.  History reviewed.  No symptoms to suggest any unstable cardiac conditions.  Based on discussion, with current pandemic situation, we will be postponing this appointment for [facility] with a plan for f/u in >12 wks or sooner if feasible/necessary.  If symptoms change, she has been instructed to contact our office.   Routing to C19 CANCEL pool for tracking (P CV DIV CV19 CANCEL - reason for visit "other.") and assigning priority (1 = 4-6 wks, 2 = 6-12 wks, 3 = >12 wks).   Ian Bushman, CMA  08/31/2018 8:44 AM

## 2018-09-03 ENCOUNTER — Ambulatory Visit: Payer: Medicare Other | Admitting: Cardiology

## 2018-09-21 DIAGNOSIS — F3341 Major depressive disorder, recurrent, in partial remission: Secondary | ICD-10-CM | POA: Diagnosis not present

## 2018-09-21 DIAGNOSIS — R5383 Other fatigue: Secondary | ICD-10-CM | POA: Diagnosis not present

## 2018-09-21 DIAGNOSIS — F0390 Unspecified dementia without behavioral disturbance: Secondary | ICD-10-CM | POA: Diagnosis not present

## 2018-09-21 DIAGNOSIS — R300 Dysuria: Secondary | ICD-10-CM | POA: Diagnosis not present

## 2018-09-21 NOTE — Telephone Encounter (Signed)
Left msg on home phone for pt or whoever provides transportation for her to call us and schedule appt w/ Dr Cristal Deer in 2 months or so.  Theodore Demark, PA-C 09/21/2018 4:09 PM Beeper [phone]

## 2018-10-08 ENCOUNTER — Ambulatory Visit: Payer: Medicare Other | Admitting: Neurology

## 2018-11-06 ENCOUNTER — Telehealth: Payer: Self-pay | Admitting: Cardiology

## 2018-11-06 NOTE — Telephone Encounter (Signed)
LVM for patient to call back and reschedule her appointment with Dr. Harrell Gave.

## 2018-11-08 DIAGNOSIS — M5136 Other intervertebral disc degeneration, lumbar region: Secondary | ICD-10-CM | POA: Diagnosis not present

## 2018-11-08 DIAGNOSIS — R609 Edema, unspecified: Secondary | ICD-10-CM | POA: Diagnosis not present

## 2018-11-08 DIAGNOSIS — F0390 Unspecified dementia without behavioral disturbance: Secondary | ICD-10-CM | POA: Diagnosis not present

## 2018-11-08 DIAGNOSIS — G894 Chronic pain syndrome: Secondary | ICD-10-CM | POA: Diagnosis not present

## 2018-12-26 DIAGNOSIS — R102 Pelvic and perineal pain: Secondary | ICD-10-CM | POA: Diagnosis not present

## 2018-12-26 DIAGNOSIS — F3341 Major depressive disorder, recurrent, in partial remission: Secondary | ICD-10-CM | POA: Diagnosis not present

## 2018-12-26 DIAGNOSIS — I10 Essential (primary) hypertension: Secondary | ICD-10-CM | POA: Diagnosis not present

## 2018-12-26 DIAGNOSIS — F0390 Unspecified dementia without behavioral disturbance: Secondary | ICD-10-CM | POA: Diagnosis not present

## 2018-12-26 DIAGNOSIS — R300 Dysuria: Secondary | ICD-10-CM | POA: Diagnosis not present

## 2018-12-26 DIAGNOSIS — M5136 Other intervertebral disc degeneration, lumbar region: Secondary | ICD-10-CM | POA: Diagnosis not present

## 2018-12-26 DIAGNOSIS — G253 Myoclonus: Secondary | ICD-10-CM | POA: Diagnosis not present

## 2019-01-01 ENCOUNTER — Ambulatory Visit: Payer: Medicare Other | Admitting: Neurology

## 2019-01-18 ENCOUNTER — Ambulatory Visit: Payer: Medicare Other | Admitting: Cardiology

## 2019-01-30 ENCOUNTER — Encounter (HOSPITAL_COMMUNITY): Payer: Self-pay

## 2019-01-30 ENCOUNTER — Other Ambulatory Visit: Payer: Self-pay

## 2019-01-30 ENCOUNTER — Emergency Department (HOSPITAL_COMMUNITY): Payer: Medicare Other

## 2019-01-30 ENCOUNTER — Emergency Department (HOSPITAL_COMMUNITY)
Admission: EM | Admit: 2019-01-30 | Discharge: 2019-01-31 | Disposition: A | Discharge status: Home / Self Care | Payer: Medicare Other | Source: Home / Self Care | Attending: Emergency Medicine | Admitting: Emergency Medicine

## 2019-01-30 DIAGNOSIS — S199XXA Unspecified injury of neck, initial encounter: Secondary | ICD-10-CM | POA: Diagnosis not present

## 2019-01-30 DIAGNOSIS — S92415A Nondisplaced fracture of proximal phalanx of left great toe, initial encounter for closed fracture: Secondary | ICD-10-CM | POA: Insufficient documentation

## 2019-01-30 DIAGNOSIS — Z79899 Other long term (current) drug therapy: Secondary | ICD-10-CM | POA: Insufficient documentation

## 2019-01-30 DIAGNOSIS — K5900 Constipation, unspecified: Secondary | ICD-10-CM | POA: Diagnosis not present

## 2019-01-30 DIAGNOSIS — K5909 Other constipation: Secondary | ICD-10-CM | POA: Diagnosis not present

## 2019-01-30 DIAGNOSIS — Z03818 Encounter for observation for suspected exposure to other biological agents ruled out: Secondary | ICD-10-CM | POA: Diagnosis not present

## 2019-01-30 DIAGNOSIS — Z7982 Long term (current) use of aspirin: Secondary | ICD-10-CM | POA: Insufficient documentation

## 2019-01-30 DIAGNOSIS — M25512 Pain in left shoulder: Secondary | ICD-10-CM | POA: Diagnosis not present

## 2019-01-30 DIAGNOSIS — S79921A Unspecified injury of right thigh, initial encounter: Secondary | ICD-10-CM | POA: Diagnosis not present

## 2019-01-30 DIAGNOSIS — R51 Headache: Secondary | ICD-10-CM | POA: Diagnosis not present

## 2019-01-30 DIAGNOSIS — W010XXA Fall on same level from slipping, tripping and stumbling without subsequent striking against object, initial encounter: Secondary | ICD-10-CM | POA: Insufficient documentation

## 2019-01-30 DIAGNOSIS — Y999 Unspecified external cause status: Secondary | ICD-10-CM | POA: Insufficient documentation

## 2019-01-30 DIAGNOSIS — M7989 Other specified soft tissue disorders: Secondary | ICD-10-CM | POA: Diagnosis not present

## 2019-01-30 DIAGNOSIS — M25552 Pain in left hip: Secondary | ICD-10-CM | POA: Insufficient documentation

## 2019-01-30 DIAGNOSIS — S0990XA Unspecified injury of head, initial encounter: Secondary | ICD-10-CM | POA: Insufficient documentation

## 2019-01-30 DIAGNOSIS — S4992XA Unspecified injury of left shoulder and upper arm, initial encounter: Secondary | ICD-10-CM | POA: Diagnosis not present

## 2019-01-30 DIAGNOSIS — Y939 Activity, unspecified: Secondary | ICD-10-CM | POA: Insufficient documentation

## 2019-01-30 DIAGNOSIS — S8991XA Unspecified injury of right lower leg, initial encounter: Secondary | ICD-10-CM | POA: Diagnosis not present

## 2019-01-30 DIAGNOSIS — G20 Parkinson's disease: Secondary | ICD-10-CM | POA: Diagnosis not present

## 2019-01-30 DIAGNOSIS — Y929 Unspecified place or not applicable: Secondary | ICD-10-CM | POA: Insufficient documentation

## 2019-01-30 DIAGNOSIS — Z87891 Personal history of nicotine dependence: Secondary | ICD-10-CM | POA: Insufficient documentation

## 2019-01-30 DIAGNOSIS — R627 Adult failure to thrive: Secondary | ICD-10-CM | POA: Diagnosis not present

## 2019-01-30 DIAGNOSIS — S92114A Nondisplaced fracture of neck of right talus, initial encounter for closed fracture: Secondary | ICD-10-CM | POA: Insufficient documentation

## 2019-01-30 DIAGNOSIS — M79651 Pain in right thigh: Secondary | ICD-10-CM | POA: Diagnosis not present

## 2019-01-30 DIAGNOSIS — S92134A Nondisplaced fracture of posterior process of right talus, initial encounter for closed fracture: Secondary | ICD-10-CM

## 2019-01-30 DIAGNOSIS — S79912A Unspecified injury of left hip, initial encounter: Secondary | ICD-10-CM | POA: Diagnosis not present

## 2019-01-30 DIAGNOSIS — M79661 Pain in right lower leg: Secondary | ICD-10-CM | POA: Diagnosis not present

## 2019-01-30 DIAGNOSIS — S92131A Displaced fracture of posterior process of right talus, initial encounter for closed fracture: Secondary | ICD-10-CM | POA: Diagnosis not present

## 2019-01-30 DIAGNOSIS — R296 Repeated falls: Secondary | ICD-10-CM | POA: Diagnosis not present

## 2019-01-30 DIAGNOSIS — Z66 Do not resuscitate: Secondary | ICD-10-CM | POA: Diagnosis not present

## 2019-01-30 DIAGNOSIS — M25561 Pain in right knee: Secondary | ICD-10-CM | POA: Diagnosis not present

## 2019-01-30 DIAGNOSIS — F0280 Dementia in other diseases classified elsewhere without behavioral disturbance: Secondary | ICD-10-CM | POA: Diagnosis not present

## 2019-01-30 DIAGNOSIS — R102 Pelvic and perineal pain: Secondary | ICD-10-CM | POA: Diagnosis not present

## 2019-01-30 DIAGNOSIS — R300 Dysuria: Secondary | ICD-10-CM | POA: Diagnosis not present

## 2019-01-30 DIAGNOSIS — F0390 Unspecified dementia without behavioral disturbance: Secondary | ICD-10-CM | POA: Insufficient documentation

## 2019-01-30 DIAGNOSIS — Z20828 Contact with and (suspected) exposure to other viral communicable diseases: Secondary | ICD-10-CM | POA: Diagnosis not present

## 2019-01-30 DIAGNOSIS — M25771 Osteophyte, right ankle: Secondary | ICD-10-CM | POA: Diagnosis not present

## 2019-01-30 NOTE — ED Triage Notes (Signed)
Pt presents with family with c/o fall. Pt's family reports bruising and swelling to her left hip, a knot on her head, left shoulder pain, and a bad bruise to her right ankle. Pt does have dementia and family is answering questions for pt. Family reports that pt did say today that her neck is bothering her.

## 2019-01-31 ENCOUNTER — Emergency Department (HOSPITAL_COMMUNITY): Payer: Medicare Other

## 2019-01-31 DIAGNOSIS — S0990XA Unspecified injury of head, initial encounter: Secondary | ICD-10-CM | POA: Diagnosis not present

## 2019-01-31 DIAGNOSIS — K5900 Constipation, unspecified: Secondary | ICD-10-CM | POA: Diagnosis not present

## 2019-01-31 DIAGNOSIS — R51 Headache: Secondary | ICD-10-CM | POA: Diagnosis not present

## 2019-01-31 DIAGNOSIS — S92131A Displaced fracture of posterior process of right talus, initial encounter for closed fracture: Secondary | ICD-10-CM | POA: Diagnosis not present

## 2019-01-31 DIAGNOSIS — S199XXA Unspecified injury of neck, initial encounter: Secondary | ICD-10-CM | POA: Diagnosis not present

## 2019-01-31 DIAGNOSIS — M25771 Osteophyte, right ankle: Secondary | ICD-10-CM | POA: Diagnosis not present

## 2019-01-31 DIAGNOSIS — M7989 Other specified soft tissue disorders: Secondary | ICD-10-CM | POA: Diagnosis not present

## 2019-01-31 DIAGNOSIS — S92415A Nondisplaced fracture of proximal phalanx of left great toe, initial encounter for closed fracture: Secondary | ICD-10-CM | POA: Diagnosis not present

## 2019-01-31 MED ORDER — ACETAMINOPHEN 500 MG PO TABS: 1000.0000 mg | ORAL_TABLET | Freq: Once | ORAL | Status: DC

## 2019-01-31 NOTE — ED Notes (Signed)
Provider at bedside

## 2019-01-31 NOTE — ED Provider Notes (Addendum)
TIME SEEN: 12:38 AM  CHIEF COMPLAINT: fall  HPI: Patient is a 78 year old female with history of dementia, hypertension, depression who presents to the emergency department with her daughter after she slipped and fell September 1 at 7 PM.  She states the patient did strike her head and has been complaining of neck pain.  Daughter noticed bruising to the left shoulder, left hip, right ankle.  She also has bruising to the left foot that she states occurred this week when the dog stepped on her foot.  Patient has been able to ambulate.  Daughter is not sure if she is on blood thinners.  Daughter also reports the patient has been constipated for several days.  She has not been vomiting.  No fever.  No abdominal pain on exam.  Daughter has not tried any over-the-counter medications for constipation.  ROS: Level 5 caveat secondary to dementia  PAST MEDICAL HISTORY/PAST SURGICAL HISTORY:  Past Medical History:  Diagnosis Date  . Anxiety   . Depression     MEDICATIONS:  Prior to Admission medications   Medication Sig Start Date End Date Taking? Authorizing Provider  Artificial Tear Ointment (DRY EYES OP) Apply 1 drop to eye daily as needed (dry eyes).    [provider]  ASPIRIN 81 PO Take 1 tablet by mouth daily.    [provider]  CALCIUM PO Take 1 tablet by mouth daily.    [provider]  carvedilol (COREG) 12.5 MG tablet Take 1 tablet by mouth every evening.  03/23/11   [provider]  divalproex (DEPAKOTE) 250 MG DR tablet Take 1 tablet (250 mg total) by mouth at bedtime. Patient not taking: Reported on 08/08/2018 06/16/18   York SpanielWillis, Charles K, MD  donepezil (ARICEPT) 10 MG tablet Take 1 tablet by mouth every evening.  03/23/11   [provider]  Ensure (ENSURE) Take 237 mLs by mouth daily.    [provider]  escitalopram (LEXAPRO) 20 MG tablet Take 20 mg by mouth daily. 08/02/18   [provider]  HYDROcodone-acetaminophen  (NORCO) 7.5-325 MG tablet Take 0.5-1 tablets by mouth 4 (four) times daily as needed for moderate pain.     [provider]  memantine (NAMENDA) 10 MG tablet Take 1 tablet by mouth 2 (two) times daily. 02/24/15   [provider]  Multiple Vitamins-Minerals (MULTIVITAMIN PO) Take 1 tablet by mouth daily.    [provider]  simvastatin (ZOCOR) 40 MG tablet Take 40 mg by mouth every evening. 06/30/18   [provider]  tretinoin (RETIN-A) 0.025 % cream Apply 1 application topically every evening. 07/31/18   [provider]  Tretinoin, Facial Wrinkles, (TRETINOIN, EMOLLIENT,) 0.05 % CREA Take 1 application by mouth as needed. 06/20/11   [provider]    ALLERGIES:  No Known Allergies  SOCIAL HISTORY:  Social History   Tobacco Use  . Smoking status: Former Smoker    Quit date: 07/29/2006    Years since quitting: 12.5  . Smokeless tobacco: Never Used  Substance Use Topics  . Alcohol use: Yes    Frequency: Never    Comment: wine 1/2 twice a week     FAMILY HISTORY: History reviewed. No pertinent family history.  EXAM: BP 128/73 (BP Location: Left Arm)   Pulse 66   Temp 98.5 F (36.9 C) (Oral)   Resp 16   SpO2 96%  CONSTITUTIONAL: Alert but patient is not oriented or able to answer questions, in no distress, elderly HEAD:  Normocephalic; atraumatic EYES: Conjunctivae clear, PERRL, EOMI ENT: normal nose; no rhinorrhea; moist mucous membranes; pharynx without lesions noted; no dental injury; no septal hematoma NECK: Supple, no meningismus, no LAD; no midline spinal tenderness, step-off or deformity; trachea midline CARD: RRR; S1 and S2 appreciated; no murmurs, no clicks, no rubs, no gallops RESP: Normal chest excursion without splinting or tachypnea; breath sounds clear and equal bilaterally; no wheezes, no rhonchi, no rales; no hypoxia or respiratory distress CHEST:  chest wall stable, no crepitus or ecchymosis or deformity, nontender  to palpation; no flail chest ABD/GI: Normal bowel sounds; non-distended; soft, non-tender, no rebound, no guarding; no ecchymosis or other lesions noted PELVIS:  stable, nontender to palpation BACK:  The back appears normal and is non-tender to palpation, there is no CVA tenderness; no midline spinal tenderness, step-off or deformity EXT: Patient has ecchymosis and tenderness noted to the left posterior shoulder, left lateral hip, right distal lower extremity just above the ankle and left dorsal foot.  2+ radial DP pulses bilaterally.  Compartments soft.  No obvious bony deformity.  No joint effusion.  She has normal range of motion in all joints. SKIN: Normal color for age and race; warm NEURO: Moves all extremities equally PSYCH: The patient's mood and manner are appropriate. Grooming and personal hygiene are appropriate.  MEDICAL DECISION MAKING: Patient here with mechanical fall.  X-rays of the left shoulder and hip were obtained in triage and are unremarkable.  Have recommended CT of the head, cervical spine and will also obtain x-rays of the right ankle, left foot.  Will give Tylenol for pain.  ED PROGRESS: Patient's daughter is now very upset that they are waiting for further imaging would like to take the patient home.  I have discussed with patient's daughter that I am concerned given patient is complaining of neck pain and has had previous neck surgery and she had a head injury and is elderly, demented and unable to answer questions and may possibly be on blood thinners that we cannot rule out life-threatening injury without further imaging.  Discussed with daughter that there are risk without imaging.  Daughter states she understands these risks and would like to go home.  We discussed signing out [place].  Daughter is convinced that her insurance will not pay for this visit if we have them sign out [place].  Discussed with daughter that I did not feel comfortable  discharging patient without these imaging.  Daughter very upset but agrees to stay for imaging.  3:00 AM  Pt's CT head and cervical spine show no acute injury.  There is a minimally displaced fracture of the posterior talar process and a possible nondisplaced fracture over the talar neck.  She is tender in this area.  We will place her in a posterior splint with stirrups and keep her nonweightbearing.  She will follow-up with orthopedics as an outpatient.  Daughter reports that she has a wheelchair at home and also takes hydrocodone every 6 hours.  Discussed with daughter that this is likely with [place] made her constipation and recommended a bowel regimen.  Abdominal x-ray shows no bowel obstruction.  Did incidentally show external rotation of the right hip but she is having no tenderness here, no leg length discrepancy and is able to range his hip normally.  Daughter states that she walks with this foot externally rotated which has been chronic for her.  She also has small nondisplaced fracture of the medial base of the  first proximal phalanx of the left foot.  Will place in postoperative shoe.  Daughter comfortable with this plan.   At this time, I do not feel there is any life-threatening condition present. I have reviewed and discussed all results (EKG, imaging, lab, urine as appropriate) and exam findings with patient/family. I have reviewed nursing notes and appropriate previous records.  I feel the patient is safe to be discharged home without further emergent workup and can continue workup as an outpatient as needed. Discussed usual and customary return precautions. Patient/family verbalize understanding and are comfortable with this plan.  Outpatient follow-up has been provided as needed. All questions have been answered.     SPLINT APPLICATION Date/Time: 3:04 AM Authorized by: Baxter Hire Larrell Rapozo Consent: Verbal consent obtained. Risks and benefits: risks, benefits and alternatives were  discussed Consent given by: patient Splint applied by: orthopedic technician Location details: Left foot and right lower extremity Splint type: Postoperative shoe on left foot, posterior splint with stirrups on right ankle Supplies used: Postoperative shoe, fiberglass Post-procedure: The splinted body part was neurovascularly unchanged following the procedure. Patient tolerance: Patient tolerated the procedure well with no immediate complications.    [place] Mcphee, Layla Maw, DO 01/31/19 0303    Lashe Oliveira, Layla Maw, DO 01/31/19 [id number]

## 2019-01-31 NOTE — ED Notes (Signed)
Daughter approached staff asking if the additional scans were necessary because they are ready to go if not. MD told staff that they are necessary due to pt hitting head. Daughter upset and stated that "if they were that concerned, they should have ordered them at 3pm when I told them" Pt also stated that they cannot leave AMA due to insurance. Provider speaking with daughter and agreed to stay for scans. Daughter refused tylenol and stated "I already gave her pain medication for her back. She doesn't need it"

## 2019-01-31 NOTE — ED Notes (Signed)
Secretary notified to page ortho tech

## 2019-01-31 NOTE — ED Notes (Signed)
Called Ortho Tech at [facility]  @[phone] 

## 2019-01-31 NOTE — ED Notes (Signed)
Pt transported to radiology.

## 2019-01-31 NOTE — ED Notes (Signed)
Ortho tech applied short leg splint to R leg. Post-op boot applied to L leg, daughter educated on follow-up care.

## 2019-01-31 NOTE — Progress Notes (Signed)
Orthopedic Tech Progress Note Patient Details:  Jasmine Carrillo 07/15/1940 [id number]  Ortho Devices Type of Ortho Device: Short leg splint Ortho Device/Splint Interventions: Application, Ordered, Adjustment   Post Interventions Patient Tolerated: Well Instructions Provided: Poper ambulation with device, Care of device, Adjustment of device   Melony Overly T 01/31/2019, 4:01 AM

## 2019-01-31 NOTE — Discharge Instructions (Signed)
I recommend that the patient stay nonweightbearing until she has been cleared by orthopedics.  Please keep the splint on her right leg on at all times and keep this clean and dry.  She may take off the postoperative shoe off her left foot as needed.  Please continue hydrocodone as needed for pain control.   I recommend that you increase your water and fiber intake. If you are not able to eat foods high in fiber, you may use Benefiber or Metamucil over-the-counter. I also recommend you use MiraLAX 1-2 times a day and Colace 100 mg twice a day to help with bowel movements. These medications are over the counter.  You may use other over-the-counter medications such as Dulcolax, Fleet enemas, magnesium citrate as needed for constipation. Please note that some of these medications may cause you to have abdominal cramping which is normal. If you develop severe abdominal pain, fever (temperature of 100.4 or higher), persistent vomiting, distention of your abdomen, unable to have a bowel movement for 5 days or are not passing gas, please return to the hospital.

## 2019-02-01 ENCOUNTER — Emergency Department (HOSPITAL_COMMUNITY): Payer: Medicare Other

## 2019-02-01 ENCOUNTER — Other Ambulatory Visit: Payer: Self-pay

## 2019-02-01 ENCOUNTER — Inpatient Hospital Stay (HOSPITAL_COMMUNITY)
Admission: EM | Admit: 2019-02-01 | Discharge: 2019-02-08 | DRG: 093 | Disposition: A | Discharge status: Skilled Nursing Facility | Payer: Medicare Other | Attending: Family Medicine | Admitting: Family Medicine

## 2019-02-01 DIAGNOSIS — I10 Essential (primary) hypertension: Secondary | ICD-10-CM | POA: Diagnosis present

## 2019-02-01 DIAGNOSIS — G8929 Other chronic pain: Secondary | ICD-10-CM | POA: Diagnosis present

## 2019-02-01 DIAGNOSIS — Z03818 Encounter for observation for suspected exposure to other biological agents ruled out: Secondary | ICD-10-CM | POA: Diagnosis not present

## 2019-02-01 DIAGNOSIS — S82891A Other fracture of right lower leg, initial encounter for closed fracture: Secondary | ICD-10-CM

## 2019-02-01 DIAGNOSIS — R4189 Other symptoms and signs involving cognitive functions and awareness: Secondary | ICD-10-CM | POA: Diagnosis present

## 2019-02-01 DIAGNOSIS — Z20828 Contact with and (suspected) exposure to other viral communicable diseases: Secondary | ICD-10-CM | POA: Diagnosis present

## 2019-02-01 DIAGNOSIS — M25561 Pain in right knee: Secondary | ICD-10-CM | POA: Diagnosis not present

## 2019-02-01 DIAGNOSIS — Z79899 Other long term (current) drug therapy: Secondary | ICD-10-CM

## 2019-02-01 DIAGNOSIS — M25512 Pain in left shoulder: Secondary | ICD-10-CM | POA: Diagnosis present

## 2019-02-01 DIAGNOSIS — S0990XA Unspecified injury of head, initial encounter: Secondary | ICD-10-CM | POA: Diagnosis present

## 2019-02-01 DIAGNOSIS — R300 Dysuria: Secondary | ICD-10-CM

## 2019-02-01 DIAGNOSIS — S92492A Other fracture of left great toe, initial encounter for closed fracture: Secondary | ICD-10-CM

## 2019-02-01 DIAGNOSIS — G253 Myoclonus: Secondary | ICD-10-CM | POA: Diagnosis present

## 2019-02-01 DIAGNOSIS — F329 Major depressive disorder, single episode, unspecified: Secondary | ICD-10-CM | POA: Diagnosis present

## 2019-02-01 DIAGNOSIS — R296 Repeated falls: Principal | ICD-10-CM

## 2019-02-01 DIAGNOSIS — Z951 Presence of aortocoronary bypass graft: Secondary | ICD-10-CM

## 2019-02-01 DIAGNOSIS — W19XXXA Unspecified fall, initial encounter: Secondary | ICD-10-CM

## 2019-02-01 DIAGNOSIS — M25552 Pain in left hip: Secondary | ICD-10-CM | POA: Diagnosis present

## 2019-02-01 DIAGNOSIS — S0093XA Contusion of unspecified part of head, initial encounter: Secondary | ICD-10-CM | POA: Diagnosis present

## 2019-02-01 DIAGNOSIS — S8991XA Unspecified injury of right lower leg, initial encounter: Secondary | ICD-10-CM | POA: Diagnosis not present

## 2019-02-01 DIAGNOSIS — F0280 Dementia in other diseases classified elsewhere without behavioral disturbance: Secondary | ICD-10-CM | POA: Diagnosis present

## 2019-02-01 DIAGNOSIS — S199XXA Unspecified injury of neck, initial encounter: Secondary | ICD-10-CM | POA: Diagnosis not present

## 2019-02-01 DIAGNOSIS — E785 Hyperlipidemia, unspecified: Secondary | ICD-10-CM | POA: Diagnosis present

## 2019-02-01 DIAGNOSIS — S79921A Unspecified injury of right thigh, initial encounter: Secondary | ICD-10-CM | POA: Diagnosis not present

## 2019-02-01 DIAGNOSIS — I2510 Atherosclerotic heart disease of native coronary artery without angina pectoris: Secondary | ICD-10-CM | POA: Diagnosis present

## 2019-02-01 DIAGNOSIS — Z87891 Personal history of nicotine dependence: Secondary | ICD-10-CM

## 2019-02-01 DIAGNOSIS — Y92019 Unspecified place in single-family (private) house as the place of occurrence of the external cause: Secondary | ICD-10-CM

## 2019-02-01 DIAGNOSIS — M79661 Pain in right lower leg: Secondary | ICD-10-CM | POA: Diagnosis not present

## 2019-02-01 DIAGNOSIS — S92402A Displaced unspecified fracture of left great toe, initial encounter for closed fracture: Secondary | ICD-10-CM | POA: Diagnosis present

## 2019-02-01 DIAGNOSIS — W010XXA Fall on same level from slipping, tripping and stumbling without subsequent striking against object, initial encounter: Secondary | ICD-10-CM | POA: Diagnosis present

## 2019-02-01 DIAGNOSIS — R52 Pain, unspecified: Secondary | ICD-10-CM | POA: Diagnosis not present

## 2019-02-01 DIAGNOSIS — R627 Adult failure to thrive: Secondary | ICD-10-CM

## 2019-02-01 DIAGNOSIS — Z7982 Long term (current) use of aspirin: Secondary | ICD-10-CM

## 2019-02-01 DIAGNOSIS — G20 Parkinson's disease: Secondary | ICD-10-CM | POA: Diagnosis present

## 2019-02-01 DIAGNOSIS — M79651 Pain in right thigh: Secondary | ICD-10-CM | POA: Diagnosis not present

## 2019-02-01 DIAGNOSIS — F419 Anxiety disorder, unspecified: Secondary | ICD-10-CM | POA: Diagnosis present

## 2019-02-01 DIAGNOSIS — F0390 Unspecified dementia without behavioral disturbance: Secondary | ICD-10-CM | POA: Diagnosis present

## 2019-02-01 DIAGNOSIS — Z66 Do not resuscitate: Secondary | ICD-10-CM | POA: Diagnosis present

## 2019-02-01 DIAGNOSIS — R102 Pelvic and perineal pain: Secondary | ICD-10-CM | POA: Diagnosis not present

## 2019-02-01 DIAGNOSIS — K5909 Other constipation: Secondary | ICD-10-CM | POA: Diagnosis present

## 2019-02-01 DIAGNOSIS — R269 Unspecified abnormalities of gait and mobility: Secondary | ICD-10-CM | POA: Diagnosis present

## 2019-02-01 DIAGNOSIS — M545 Low back pain: Secondary | ICD-10-CM | POA: Diagnosis present

## 2019-02-01 DIAGNOSIS — Z9181 History of falling: Secondary | ICD-10-CM

## 2019-02-01 HISTORY — DX: Repeated falls: R29.6

## 2019-02-01 LAB — URINALYSIS, ROUTINE W REFLEX MICROSCOPIC
Bilirubin Urine: NEGATIVE
Glucose, UA: NEGATIVE mg/dL
Hgb urine dipstick: NEGATIVE
Ketones, ur: NEGATIVE mg/dL
Leukocytes,Ua: NEGATIVE
Nitrite: NEGATIVE
Protein, ur: NEGATIVE mg/dL
Specific Gravity, Urine: 1.016 (ref 1.005–1.030)
pH: 5 (ref 5.0–8.0)

## 2019-02-01 LAB — CREATININE, SERUM
Creatinine, Ser: 0.68 mg/dL (ref 0.44–1.00)
GFR calc Af Amer: >60 mL/min (ref >60)
GFR calc non Af Amer: >60 mL/min (ref >60)

## 2019-02-01 LAB — CBC WITH DIFFERENTIAL/PLATELET
Abs Immature Granulocytes: 0.01 10*3/uL (ref 0.00–0.07)
Basophils Absolute: 0 10*3/uL (ref 0.0–0.1)
Basophils Relative: 1 %
Eosinophils Absolute: 0.1 10*3/uL (ref 0.0–0.5)
Eosinophils Relative: 2 %
HCT: 41.6 % (ref 36.0–46.0)
Hemoglobin: 13.3 g/dL (ref 12.0–15.0)
Immature Granulocytes: 0 %
Lymphocytes Relative: 21 %
Lymphs Abs: 1.1 10*3/uL (ref 0.7–4.0)
MCH: 30.6 pg (ref 26.0–34.0)
MCHC: 32 g/dL (ref 30.0–36.0)
MCV: 95.9 fL (ref 80.0–100.0)
Monocytes Absolute: 0.4 10*3/uL (ref 0.1–1.0)
Monocytes Relative: 8 %
Neutro Abs: 3.6 10*3/uL (ref 1.7–7.7)
Neutrophils Relative %: 68 %
Platelets: 218 10*3/uL (ref 150–400)
RBC: 4.34 MIL/uL (ref 3.87–5.11)
RDW: 13.6 % (ref 11.5–15.5)
WBC: 5.3 10*3/uL (ref 4.0–10.5)
nRBC: 0 % (ref 0.0–0.2)

## 2019-02-01 LAB — LIPID PANEL
Cholesterol: 162 mg/dL (ref 0–200)
HDL: 72 mg/dL (ref >40)
LDL Cholesterol: 80 mg/dL (ref 0–99)
Total CHOL/HDL Ratio: 2.3 RATIO
Triglycerides: 51 mg/dL (ref <150)
VLDL: 10 mg/dL (ref 0–40)

## 2019-02-01 LAB — BASIC METABOLIC PANEL
Anion gap: 8 (ref 5–15)
BUN: 15 mg/dL (ref 8–23)
CO2: 25 mmol/L (ref 22–32)
Calcium: 8.8 mg/dL — ABNORMAL LOW (ref 8.9–10.3)
Chloride: 108 mmol/L (ref 98–111)
Creatinine, Ser: 0.79 mg/dL (ref 0.44–1.00)
GFR calc Af Amer: >60 mL/min (ref >60)
GFR calc non Af Amer: >60 mL/min (ref >60)
Glucose, Bld: 91 mg/dL (ref 70–99)
Potassium: 4 mmol/L (ref 3.5–5.1)
Sodium: 141 mmol/L (ref 135–145)

## 2019-02-01 LAB — CBC
HCT: 40.8 % (ref 36.0–46.0)
Hemoglobin: 12.7 g/dL (ref 12.0–15.0)
MCH: 30.4 pg (ref 26.0–34.0)
MCHC: 31.1 g/dL (ref 30.0–36.0)
MCV: 97.6 fL (ref 80.0–100.0)
Platelets: 222 10*3/uL (ref 150–400)
RBC: 4.18 MIL/uL (ref 3.87–5.11)
RDW: 13.7 % (ref 11.5–15.5)
WBC: 5.9 10*3/uL (ref 4.0–10.5)
nRBC: 0 % (ref 0.0–0.2)

## 2019-02-01 LAB — TSH: TSH: 0.858 u[IU]/mL (ref 0.350–4.500)

## 2019-02-01 LAB — MAGNESIUM: Magnesium: 2.2 mg/dL (ref 1.7–2.4)

## 2019-02-01 LAB — SARS CORONAVIRUS 2 BY RT PCR (HOSPITAL ORDER, PERFORMED IN ~~LOC~~ HOSPITAL LAB): SARS Coronavirus 2: NEGATIVE

## 2019-02-01 LAB — VITAMIN B12: Vitamin B-12: 415 pg/mL (ref 180–914)

## 2019-02-01 LAB — PHOSPHORUS: Phosphorus: 3.3 mg/dL (ref 2.5–4.6)

## 2019-02-01 MED ORDER — MEMANTINE HCL 10 MG PO TABS
10.0000 mg | ORAL_TABLET | Freq: Two times a day (BID) | ORAL | Status: DC
  Administered 2019-02-01 – 2019-02-08 (×14): 10 mg via ORAL
  Filled 2019-02-01 (×14): qty 1

## 2019-02-01 MED ORDER — ASPIRIN 81 MG PO CHEW
81.0000 mg | CHEWABLE_TABLET | Freq: Every day | ORAL | Status: DC
  Administered 2019-02-01 – 2019-02-08 (×8): 81 mg via ORAL
  Filled 2019-02-01 (×8): qty 1

## 2019-02-01 MED ORDER — ENOXAPARIN SODIUM 40 MG/0.4ML ~~LOC~~ SOLN
40.0000 mg | SUBCUTANEOUS | Status: DC
  Administered 2019-02-01 – 2019-02-07 (×7): 40 mg via SUBCUTANEOUS
  Filled 2019-02-01 (×7): qty 0.4

## 2019-02-01 MED ORDER — HYDROCODONE-ACETAMINOPHEN 7.5-325 MG PO TABS
1.0000 | ORAL_TABLET | Freq: Four times a day (QID) | ORAL | Status: DC | PRN
  Administered 2019-02-01 – 2019-02-08 (×14): 1 via ORAL
  Filled 2019-02-01 (×14): qty 1

## 2019-02-01 MED ORDER — SIMVASTATIN 20 MG PO TABS
40.0000 mg | ORAL_TABLET | Freq: Every evening | ORAL | Status: DC
  Administered 2019-02-01 – 2019-02-07 (×7): 40 mg via ORAL
  Filled 2019-02-01 (×7): qty 2

## 2019-02-01 MED ORDER — ACETAMINOPHEN 325 MG PO TABS
650.0000 mg | ORAL_TABLET | Freq: Four times a day (QID) | ORAL | Status: DC | PRN
  Administered 2019-02-03 – 2019-02-06 (×6): 650 mg via ORAL
  Filled 2019-02-01 (×5): qty 2

## 2019-02-01 MED ORDER — CARVEDILOL 6.25 MG PO TABS
6.2500 mg | ORAL_TABLET | Freq: Two times a day (BID) | ORAL | Status: DC
  Administered 2019-02-01 – 2019-02-08 (×14): 6.25 mg via ORAL
  Filled 2019-02-01 (×2): qty 1
  Filled 2019-02-01: qty 2
  Filled 2019-02-01 (×11): qty 1

## 2019-02-01 MED ORDER — ESCITALOPRAM OXALATE 10 MG PO TABS
20.0000 mg | ORAL_TABLET | Freq: Every day | ORAL | Status: DC
  Administered 2019-02-01 – 2019-02-03 (×3): 20 mg via ORAL
  Filled 2019-02-01 (×3): qty 2

## 2019-02-01 MED ORDER — DONEPEZIL HCL 10 MG PO TABS
10.0000 mg | ORAL_TABLET | Freq: Every evening | ORAL | Status: DC
  Administered 2019-02-01 – 2019-02-07 (×7): 10 mg via ORAL
  Filled 2019-02-01 (×7): qty 1

## 2019-02-01 MED ORDER — SODIUM CHLORIDE 0.9 % IV BOLUS: 500.0000 mL | Freq: Once | INTRAVENOUS | Status: DC

## 2019-02-01 MED ORDER — ACETAMINOPHEN 650 MG RE SUPP: 650.0000 mg | Freq: Four times a day (QID) | RECTAL | Status: DC | PRN

## 2019-02-01 MED ORDER — POLYETHYLENE GLYCOL 3350 17 G PO PACK
17.0000 g | PACK | Freq: Every day | ORAL | Status: DC
  Administered 2019-02-02 – 2019-02-08 (×7): 17 g via ORAL
  Filled 2019-02-01 (×7): qty 1

## 2019-02-01 NOTE — ED Provider Notes (Signed)
[facility] Provider Note   CSN: [id number] Arrival date & time: 02/01/19  1334     History   Chief Complaint Chief Complaint  Patient presents with   Fall    HPI Jasmine Carrillo is a 78 y.o. female with history of dementia presents by EMS after fall.  Initial history taken from nursing notes as well as directly from patient as no family member at bedside.  Apparently patient had a witnessed fall by daughter with right knee pain and right leg pain.  She was seen it was a long yesterday after a fall with a right ankle fracture, splint in place.  No history of head injury.  On initial evaluation patient is alert to self and place however not to time or event.  She is endorsing right knee pain as well as neck pain.  She has no other complaints. === ED visit 01/30/2019:  DG Left Shoulder: IMPRESSION: Negative.  DG Pelvis: IMPRESSION: Negative.  CT Head/Cspine: IMPRESSION: 1. No acute intracranial abnormality. 2. Small amount of left parieto-occipital scalp infiltration without subjacent calvarial fracture. 3. No acute cervical spine fracture. 4. 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 5. Aortic Atherosclerosis (ICD10-I70.0). Carotid atherosclerosis. 6. Emphysema (ICD10-J43.9). 7. Subcentimeter nodule in the posterior left lobe thyroid. No further imaging required. This follows ACR consensus guidelines: Managing Incidental Thyroid Nodules Detected on Imaging: White Paper of the ACR Incidental Thyroid Findings Committee. J Am Coll Radiol 2015; 12:143-150.  DG Abd: IMPRESSION: Nonobstructive bowel gas pattern.  Aortic Atherosclerosis (ICD10-I70.0).  No visible traumatic abnormality although limited evaluation of the pelvis given partial collimation and external rotation of the right hip  DG Right Ankle: IMPRESSION: 1. Minimally displaced fracture of the posterior  talar process. 2. Question nondisplaced fracture lucency versus prominent vascular channel through the talar neck. Correlate with point tenderness. Consider dedicated foot radiographs.  DG Left Foot: IMPRESSION: Small nondisplaced fracture involving the medial base of the first proximal phalanx.     HPI  Past Medical History:  Diagnosis Date   Anxiety    Depression     There are no active problems to display for this patient.   Past Surgical History:  Procedure Laterality Date   CORONARY ARTERY BYPASS GRAFT     NECK SURGERY     Neck bone replacement      OB History   No obstetric history on file.      Home Medications    Prior to Admission medications   Medication Sig Start Date End Date Taking? Authorizing Provider  Artificial Tear Ointment (DRY EYES OP) Apply 1 drop to eye daily as needed (dry eyes).    [provider]  ASPIRIN 81 PO Take 1 tablet by mouth daily.    [provider]  CALCIUM PO Take 1 tablet by mouth daily.    [provider]  carvedilol (COREG) 12.5 MG tablet Take 1 tablet by mouth every evening.  03/23/11   [provider]  divalproex (DEPAKOTE) 250 MG DR tablet Take 1 tablet (250 mg total) by mouth at bedtime. Patient not taking: Reported on 08/08/2018 06/16/18   Jasmine Spaniel, MD  donepezil (ARICEPT) 10 MG tablet Take 1 tablet by mouth every evening.  03/23/11   [provider]  Ensure (ENSURE) Take 237 mLs by mouth daily.    [provider]  escitalopram (LEXAPRO) 20 MG tablet Take 20 mg by mouth daily. 08/02/18  [provider]  HYDROcodone-acetaminophen (NORCO) 7.5-325 MG tablet Take 0.5-1 tablets by mouth 4 (four) times daily as needed for moderate pain.     [provider]  memantine (NAMENDA) 10 MG tablet Take 1 tablet by mouth 2 (two) times daily. 02/24/15   [provider]  Multiple Vitamins-Minerals (MULTIVITAMIN PO) Take 1 tablet by mouth daily.     [provider]  simvastatin (ZOCOR) 40 MG tablet Take 40 mg by mouth every evening. 06/30/18   [provider]  tretinoin (RETIN-A) 0.025 % cream Apply 1 application topically every evening. 07/31/18   [provider]  Tretinoin, Facial Wrinkles, (TRETINOIN, EMOLLIENT,) 0.05 % CREA Take 1 application by mouth as needed. 06/20/11   [provider]    Family History No family history on file.  Social History Social History   Tobacco Use   Smoking status: Former Smoker    Quit date: 07/29/2006    Years since quitting: 12.5   Smokeless tobacco: Never Used  Substance Use Topics   Alcohol use: Yes    Frequency: Never    Comment: wine 1/2 twice a week    Drug use: No     Allergies   Patient has no known allergies.   Review of Systems Review of Systems  Unable to perform ROS: Dementia   Physical Exam Updated Vital Signs BP (!) 167/84    Pulse 72    Temp 98.8 F (37.1 C) (Oral)    SpO2 96%   Physical Exam Constitutional:      General: She is not in acute distress.    Appearance: Normal appearance. She is well-developed. She is not ill-appearing or diaphoretic.  HENT:     Head: Normocephalic and atraumatic.     Right Ear: External ear normal.     Left Ear: External ear normal.     Nose: Nose normal.  Eyes:     General: Vision grossly intact. Gaze aligned appropriately.     Pupils: Pupils are equal, round, and reactive to light.  Neck:     Musculoskeletal: Normal range of motion.     Trachea: Trachea and phonation normal. No tracheal deviation.  Cardiovascular:     Rate and Rhythm: Normal rate and regular rhythm.     Pulses: Normal pulses.          Radial pulses are 2+ on the right side and 2+ on the left side.       Dorsalis pedis pulses are 2+ on the right side and 2+ on the left side.     Heart sounds: Normal heart sounds.  Pulmonary:     Effort: Pulmonary effort is normal. No respiratory distress.  Abdominal:     General: There  is no distension.     Palpations: Abdomen is soft.     Tenderness: There is no abdominal tenderness. There is no guarding or rebound.  Musculoskeletal: Normal range of motion.     Comments: No midline C/T/L spinal tenderness to palpation, no paraspinal muscle tenderness, no deformity, crepitus, or step-off noted. No sign of injury to the neck or back. - Hips stable to compression bilaterally, patient denies pain of the hips.  Patient able to actively bring bilateral knees-to-chest without obvious pain. - Palpation of all 4 extremities with range of motion of all major joints without obvious injury, crepitus or deformity with exception of the right ankle which is splinted.  Feet:     Right foot:     Protective Sensation: 3  sites tested. 3 sites sensed.     Left foot:     Protective Sensation: 3 sites tested. 3 sites sensed.     Comments: Capillary refill intact to right lower extremity with splint in place. Skin:    General: Skin is warm and dry.  Neurological:     Mental Status: She is alert.     GCS: GCS eye subscore is 4. GCS verbal subscore is 4. GCS motor subscore is 6.  Psychiatric:        Behavior: Behavior normal.    ED Treatments / Results  Labs (all labs ordered are listed, but only abnormal results are displayed) Labs Reviewed  URINE CULTURE  CBC WITH DIFFERENTIAL/PLATELET  URINALYSIS, ROUTINE W REFLEX MICROSCOPIC  BASIC METABOLIC PANEL    EKG None  Radiology Dg Pelvis 1-2 Views  Result Date: 02/01/2019 CLINICAL DATA:  Pain status post fall EXAM: PELVIS - 1-2 VIEW COMPARISON:  None. FINDINGS: There is no evidence of pelvic fracture or diastasis. No pelvic bone lesions are seen. Advanced vascular calcifications are noted. IMPRESSION: Negative. Electronically Signed   By: Constance Holster M.D.   On: 02/01/2019 15:05   Dg Tibia/fibula Right  Result Date: 02/01/2019 CLINICAL DATA:  Patient had a witnessed fall by daughter and Right knee pain, traveling down the right  leg. Seen yesterday at [facility] for Right ankle fracture and left big toe fracture. History of dementia EXAM: RIGHT TIBIA AND FIBULA - 2 VIEW COMPARISON:  RIGHT ankle 01/31/2019 FINDINGS: LOWER leg is imaged through splinting material. There is no acute fracture or subluxation of the tibia or fibula. No posterior talar process fracture is not well seen. Degenerative changes are seen at the knee. IMPRESSION: No evidence for acute abnormality. Electronically Signed   By: Nolon Nations M.D.   On: 02/01/2019 15:04   Dg Ankle Complete Right  Result Date: 01/31/2019 CLINICAL DATA:  Fall, foot and ankle bruising EXAM: RIGHT ANKLE - COMPLETE 3+ VIEW COMPARISON:  None. FINDINGS: The osseous structures appear diffusely demineralized which may limit detection of small or nondisplaced fractures. There is a minimally displaced fracture of the posterior talar process. Question a nondisplaced fracture lucency versus prominent vascular channel through the talar neck. Minimal ankle swelling. No sizable left ankle effusion. Ankle mortise is congruent. Enthesopathic changes are noted at the Achilles insertion upon the calcaneus. Midfoot and hindfoot alignment is grossly preserved though incompletely assessed on these nondedicated, nonweightbearing films. IMPRESSION: 1. Minimally displaced fracture of the posterior talar process. 2. Question nondisplaced fracture lucency versus prominent vascular channel through the talar neck. Correlate with point tenderness. Consider dedicated foot radiographs. Electronically Signed   By: Lovena Le M.D.   On: 01/31/2019 02:30   Dg Abd 1 View  Result Date: 01/31/2019 CLINICAL DATA:  Constipation, foot bruising EXAM: ABDOMEN - 1 VIEW COMPARISON:  None. FINDINGS: Bowel gas pattern is nonobstructive. Sternotomy suture is noted in the lower chest. Surgical clips are present at the diaphragmatic hiatus. Atherosclerotic plaque within the normal caliber aorta. There is severe levocurvature of  the lumbar spine. Multilevel degenerative changes are present throughout the spine. No visible fracture or traumatic abnormality of the pelvis is the incompletely evaluated particularly given external rotation of the right hip. IMPRESSION: Nonobstructive bowel gas pattern. Aortic Atherosclerosis (ICD10-I70.0). No visible traumatic abnormality although limited evaluation of the pelvis given partial collimation and external rotation of the right hip Electronically Signed   By: Lovena Le M.D.   On: 01/31/2019 02:27   Ct  Head Wo Contrast  Result Date: 02/01/2019 CLINICAL DATA:  Head trauma, witnessed fall, dementia EXAM: CT HEAD WITHOUT CONTRAST CT CERVICAL SPINE WITHOUT CONTRAST TECHNIQUE: Multidetector CT imaging of the head and cervical spine was performed following the standard protocol without intravenous contrast. Multiplanar CT image reconstructions of the cervical spine were also generated. COMPARISON:  01/31/2019 FINDINGS: CT HEAD FINDINGS Brain: No evidence of acute infarction, hemorrhage, hydrocephalus, extra-axial collection or mass lesion/mass effect. There is extensive periventricular and deep white matter hypodensity, global volume loss, and prominence of the lateral ventricles. Vascular: No hyperdense vessel or unexpected calcification. Skull: Normal. Negative for fracture or focal lesion. Sinuses/Orbits: No acute finding. Other: None. CT CERVICAL SPINE FINDINGS Alignment: Normal. Skull base and vertebrae: No acute fracture. No primary bone lesion or focal pathologic process. Soft tissues and spinal canal: No prevertebral fluid or swelling. No visible canal hematoma. Disc levels: Fusion of C5-C6. Otherwise mild multilevel disc space height loss. Upper chest: Emphysema. Other: Carotid and aortic atherosclerosis. IMPRESSION: 1. No acute intracranial pathology. Advanced small-vessel white matter disease and global volume loss with enlargement of the ventricles ex vacuo. 2.  No fracture or static  subluxation of the cervical spine. Electronically Signed   By: Lauralyn PrimesAlex  Bibbey M.D.   On: 02/01/2019 15:32   Ct Head Wo Contrast  Result Date: 01/31/2019 CLINICAL DATA:  Headache, posttraumatic, positive head strike EXAM: CT HEAD WITHOUT CONTRAST CT CERVICAL SPINE WITHOUT CONTRAST TECHNIQUE: Multidetector CT imaging of the head and cervical spine was performed following the standard protocol without intravenous contrast. Multiplanar CT image reconstructions of the cervical spine were also generated. COMPARISON:  CT 08/08/2018 FINDINGS: CT HEAD FINDINGS Brain: No evidence of acute infarction, hemorrhage, hydrocephalus, extra-axial collection or mass lesion/mass effect. Symmetric prominence of the ventricles, cisterns and sulci compatible with moderate parenchymal volume loss. Patchy and confluent areas of white matter hypoattenuation are most compatible with chronic microvascular angiopathy. Senescent mineralization of the basal ganglia. Vascular: Atherosclerotic calcification of the carotid siphons. No hyperdense vessel or dural sinus. Skull: Small amount of left parieto-occipital scalp infiltration. No subjacent calvarial fracture. Sinuses/Orbits: Hypo pneumatization of the right maxillary sinus is only partially evaluated on this exam. Similar appearance to prior head CT. Remaining paranasal sinuses and mastoids are predominantly clear. Small amount of debris in the external auditory canals. Other: None. CT CERVICAL SPINE FINDINGS Alignment: Straightening of the normal cervical lordosis with slight focal reversal across the fused C5-6 levels. The non instrumented C5-6 fusion appears complete. Degenerative anterolisthesis of C4 on C5 is unchanged from comparison. No traumatic listhesis is identified. Atlantoaxial and craniocervical articulations are maintained. Skull base and vertebrae: No acute fracture. No primary bone lesion or focal pathologic process. Soft tissues and spinal canal: No pre or paravertebral  fluid or swelling. No visible canal hematoma. Disc levels: Fusion of the C5-6 levels. Adjacent segment disease at C6-7 with cervical spondylitic endplate changes. Disc osteophyte complex at C4-5. No significant spinal canal stenosis. Moderate bilateral foraminal narrowing at C6-7, mild left and moderate right foraminal narrowing at C4-5. Upper chest: No acute abnormality in the upper chest or imaged lung apices. Biapical pleuroparenchymal scarring and emphysematous changes. Atherosclerotic calcification of the aortic arch and great vessels. Calcification of the common carotids and carotid bifurcations. Surgical clips present at the sternal notch. Stable subcentimeter thyroid nodule in the posterior left lobe. Other: None. IMPRESSION: 1. No acute intracranial abnormality. 2. Small amount of left parieto-occipital scalp infiltration without subjacent calvarial fracture. 3. No acute cervical spine fracture. 4. 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 5. Aortic Atherosclerosis (ICD10-I70.0).  Carotid atherosclerosis. 6. Emphysema (ICD10-J43.9). 7. Subcentimeter nodule in the posterior left lobe thyroid. No further imaging required. This follows ACR consensus guidelines: Managing Incidental Thyroid Nodules Detected on Imaging: White Paper of the ACR Incidental Thyroid Findings Committee. J Am Coll Radiol 2015; 12:143-150. Electronically Signed   By: Kreg Shropshire M.D.   On: 01/31/2019 02:25   Ct Cervical Spine Wo Contrast  Result Date: 02/01/2019 CLINICAL DATA:  Head trauma, witnessed fall, dementia EXAM: CT HEAD WITHOUT CONTRAST CT CERVICAL SPINE WITHOUT CONTRAST TECHNIQUE: Multidetector CT imaging of the head and cervical spine was performed following the standard protocol without intravenous contrast. Multiplanar CT image reconstructions of the cervical spine were also generated. COMPARISON:  01/31/2019 FINDINGS: CT HEAD FINDINGS Brain: No  evidence of acute infarction, hemorrhage, hydrocephalus, extra-axial collection or mass lesion/mass effect. There is extensive periventricular and deep white matter hypodensity, global volume loss, and prominence of the lateral ventricles. Vascular: No hyperdense vessel or unexpected calcification. Skull: Normal. Negative for fracture or focal lesion. Sinuses/Orbits: No acute finding. Other: None. CT CERVICAL SPINE FINDINGS Alignment: Normal. Skull base and vertebrae: No acute fracture. No primary bone lesion or focal pathologic process. Soft tissues and spinal canal: No prevertebral fluid or swelling. No visible canal hematoma. Disc levels: Fusion of C5-C6. Otherwise mild multilevel disc space height loss. Upper chest: Emphysema. Other: Carotid and aortic atherosclerosis. IMPRESSION: 1. No acute intracranial pathology. Advanced small-vessel white matter disease and global volume loss with enlargement of the ventricles ex vacuo. 2.  No fracture or static subluxation of the cervical spine. Electronically Signed   By: Lauralyn Primes M.D.   On: 02/01/2019 15:32   Ct Cervical Spine Wo Contrast  Result Date: 01/31/2019 CLINICAL DATA:  Headache, posttraumatic, positive head strike EXAM: CT HEAD WITHOUT CONTRAST CT CERVICAL SPINE WITHOUT CONTRAST TECHNIQUE: Multidetector CT imaging of the head and cervical spine was performed following the standard protocol without intravenous contrast. Multiplanar CT image reconstructions of the cervical spine were also generated. COMPARISON:  CT 08/08/2018 FINDINGS: CT HEAD FINDINGS Brain: No evidence of acute infarction, hemorrhage, hydrocephalus, extra-axial collection or mass lesion/mass effect. Symmetric prominence of the ventricles, cisterns and sulci compatible with moderate parenchymal volume loss. Patchy and confluent areas of white matter hypoattenuation are most compatible with chronic microvascular angiopathy. Senescent mineralization of the basal ganglia. Vascular:  Atherosclerotic calcification of the carotid siphons. No hyperdense vessel or dural sinus. Skull: Small amount of left parieto-occipital scalp infiltration. No subjacent calvarial fracture. Sinuses/Orbits: Hypo pneumatization of the right maxillary sinus is only partially evaluated on this exam. Similar appearance to prior head CT. Remaining paranasal sinuses and mastoids are predominantly clear. Small amount of debris in the external auditory canals. Other: None. CT CERVICAL SPINE FINDINGS Alignment: Straightening of the normal cervical lordosis with slight focal reversal across the fused C5-6 levels. The non instrumented C5-6 fusion appears complete. Degenerative anterolisthesis of C4 on C5 is unchanged from comparison. No traumatic listhesis is identified. Atlantoaxial and craniocervical articulations are maintained. Skull base and vertebrae: No acute fracture. No primary bone lesion or focal pathologic process. Soft tissues and spinal canal: No pre or paravertebral fluid or swelling. No visible canal hematoma. Disc levels: Fusion of the C5-6 levels. Adjacent segment disease at C6-7 with cervical spondylitic endplate changes. Disc osteophyte complex at C4-5. No significant spinal canal stenosis. Moderate bilateral foraminal narrowing at C6-7, mild left and moderate right foraminal narrowing  at C4-5. Upper chest: No acute abnormality in the upper chest or imaged lung apices. Biapical pleuroparenchymal scarring and emphysematous changes. Atherosclerotic calcification of the aortic arch and great vessels. Calcification of the common carotids and carotid bifurcations. Surgical clips present at the sternal notch. Stable subcentimeter thyroid nodule in the posterior left lobe. Other: None. IMPRESSION: 1. No acute intracranial abnormality. 2. Small amount of left parieto-occipital scalp infiltration without subjacent calvarial fracture. 3. No acute cervical spine fracture. 4. Prior C5-6 non-instrumented fusion with  adjacent segment disease and cervical spondylitic changes detailed above. 5. Aortic Atherosclerosis (ICD10-I70.0).  Carotid atherosclerosis. 6. Emphysema (ICD10-J43.9). 7. Subcentimeter nodule in the posterior left lobe thyroid. No further imaging required. This follows ACR consensus guidelines: Managing Incidental Thyroid Nodules Detected on Imaging: White Paper of the ACR Incidental Thyroid Findings Committee. J Am Coll Radiol 2015; 12:143-150. Electronically Signed   By: Kreg Shropshire M.D.   On: 01/31/2019 02:25   Dg Shoulder Left  Result Date: 01/30/2019 CLINICAL DATA:  Left shoulder pain after fall today. EXAM: LEFT SHOULDER - 2+ VIEW COMPARISON:  None. FINDINGS: There is no evidence of fracture or dislocation. There is no evidence of arthropathy or other focal bone abnormality. Soft tissues are unremarkable. IMPRESSION: Negative. Electronically Signed   By: Lupita Raider M.D.   On: 01/30/2019 16:10   Dg Knee Complete 4 Views Right  Result Date: 02/01/2019 CLINICAL DATA:  Pain status post fall EXAM: RIGHT KNEE - COMPLETE 4+ VIEW COMPARISON:  None. 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 There is mild joint space narrowing. There is no displaced fracture. No dislocation. However, evaluation is limited by osteopenia. Advanced vascular calcifications are noted. IMPRESSION: Degenerative changes without acute osseous finding. Electronically Signed   By: Katherine Mantle M.D.   On: 02/01/2019 15:03   Dg Foot Complete Left  Result Date: 01/31/2019 CLINICAL DATA:  Fall, foot bruising at the base of the great toe. EXAM: LEFT FOOT - COMPLETE 3+ VIEW COMPARISON:  None. FINDINGS: The osseous structures appear diffusely demineralized which may limit detection of small or nondisplaced fractures. Small nondisplaced fracture involving the medial base of the first proximal phalanx. Mild adjacent soft tissue swelling. No other definite acute fracture or traumatic malalignment. Midfoot and hindfoot alignment is grossly  preserved though incompletely assessed on nonweightbearing films. Small corticated os peroneum is noted. No subcutaneous gas or foreign body. Vascular calcium is noted in the soft tissues. Soft tissues are otherwise unremarkable. IMPRESSION: Small nondisplaced fracture involving the medial base of the first proximal phalanx. Electronically Signed   By: Kreg Shropshire M.D.   On: 01/31/2019 02:33   Dg Hip Unilat With Pelvis 2-3 Views Left  Result Date: 01/30/2019 CLINICAL DATA:  Left hip pain after fall today. EXAM: DG HIP (WITH OR WITHOUT PELVIS) 2-3V LEFT COMPARISON:  None. FINDINGS: There is no evidence of hip fracture or dislocation. There is no evidence of arthropathy or other focal bone abnormality. IMPRESSION: Negative. Electronically Signed   By: Lupita Raider M.D.   On: 01/30/2019 16:11   Dg Femur Min 2 Views Right  Result Date: 02/01/2019 CLINICAL DATA:  Pain status post fall EXAM: RIGHT FEMUR 2 VIEWS COMPARISON:  None. FINDINGS: There is no evidence of fracture or other focal bone lesions. Soft tissues are unremarkable. Advanced vascular calcifications are noted. IMPRESSION: Negative. Electronically Signed   By: Katherine Mantle M.D.   On: 02/01/2019 15:04    Procedures Procedures (including critical care time)  Medications Ordered in ED Medications - No  data to display   Initial Impression / Assessment and Plan / ED Course  I have reviewed the triage vital signs and the nursing notes.  Pertinent labs & imaging results that were available during my care of the patient were reviewed by me and considered in my medical decision making (see chart for details).     78 year old demented patient presents today after fall with apparent right leg pain without obvious deformity, neurovascular intact to all 4 extremities.  There is no history of head injury per triage note however patient does report some neck pain, she is demented difficult to obtain further history.  Will obtain imaging of  the head, neck and right lower extremity with pelvis. - Patient transported to radiology department.  Patient's daughter is now at bedside.  Patient's daughter reports that after they returned home from was in the hospital early this morning daughter was assisting patient into the home when she fell towards her left side landing on the ground.  Daughter denies any complaint of injury from the patient following this fall, the continued into the house and went to bed.  When the patient woke up this morning daughter was assisting her to using the commode when patient fell off the commode sliding forward onto her right knee.  Patient then complained of right knee pain and EMS was activated.  Daughter denies any head injury with the 2 recurrent falls since discharge last night.  She is concerned as she feels her mother's dementia is worsening over the past several weeks, additionally daughter notes that the patient has been complaining of dysuria for the past several days for which she has been treating with cranberry juice. - Will obtain basic blood work as well as urinalysis at this time. - DG Pelvis:  IMPRESSION:  Negative.   DG Right Femur:  IMPRESSION:  Negative.   DG Right Knee:    IMPRESSION:  Degenerative changes without acute osseous finding.   DG Right Tib/Fib:  IMPRESSION:  No evidence for acute abnormality.   CT Head/Cspine:  IMPRESSION:  1. No acute intracranial pathology. Advanced small-vessel white  matter disease and global volume loss with enlargement of the  ventricles ex vacuo.    2. No fracture or static subluxation of the cervical spine.  - Care handoff given to Britni Henderly PA-C at shift change.  Blood work and urinalysis currently pending.  Plan of care at this time is to await further results, disposition per oncoming team.  Case discussed with Dr. Rosalia Hammers during this visit.  Note: Portions of this report may have been transcribed using voice recognition  software. Every effort was made to ensure accuracy; however, inadvertent computerized transcription errors may still be present. Final Clinical Impressions(s) / ED Diagnoses   Final diagnoses:  None    ED Discharge Orders    None       Elizabeth Palau 02/01/19 1554    Margarita Grizzle, MD 02/01/19 [phone]

## 2019-02-01 NOTE — ED Notes (Addendum)
ED TO INPATIENT HANDOFF REPORT  ED Nurse Name and Phone #: Annie Main [phone]  S Name/Age/Gender Jasmine Carrillo 78 y.o. female Room/Bed: 029C/029C  Code Status   Code Status: Full Code  Home/SNF/Other Home Patient oriented to: self, situation Is this baseline? Yes   Triage Complete: Triage complete  Chief Complaint fall  Triage Note Patient had a witnessed fall by daughter and Right knee pain, traveling down the right leg. Seen yesterday at [facility] for Right ankle fracture and left big toe fracture. History of dementia. Denies hitting head.   Allergies No Known Allergies  Level of Care/Admitting Diagnosis ED Disposition    ED Disposition Condition Comment   Admit  Hospital Area: [place] [[id number]]  Level of Care: Med-Surg [16]  I expect the patient will be discharged within 24 hours: No (not a candidate for 5C-Observation unit)  Covid Evaluation: Asymptomatic Screening Protocol (No Symptoms)  Diagnosis: Recurrent falls [725973]  Admitting Physician: Mckinley Jewel [[id number]]  Attending Physician: Mckinley Jewel [phone]  PT Class (Do Not Modify): Observation [104]  PT Acc Code (Do Not Modify): Observation [10022]       B Medical/Surgery History Past Medical History:  Diagnosis Date  . Anxiety   . Depression    Past Surgical History:  Procedure Laterality Date  . CORONARY ARTERY BYPASS GRAFT    . NECK SURGERY     Neck bone replacement      A IV Location/Drains/Wounds Patient Lines/Drains/Airways Status   Active Line/Drains/Airways    Name:   Placement date:   Placement time:   Site:   Days:   Peripheral IV 02/01/19 Left Forearm   02/01/19    1643    Forearm   less than 1          Intake/Output Last 24 hours No intake or output data in the 24 hours ending 02/01/19 1743  Labs/Imaging Results for orders placed or performed during the hospital encounter of 02/01/19 (from the past 48 hour(s))  CBC with Differential     Status:  None   Collection Time: 02/01/19  3:33 PM  Result Value Ref Range   WBC 5.3 4.0 - 10.5 K/uL   RBC 4.34 3.87 - 5.11 MIL/uL   Hemoglobin 13.3 12.0 - 15.0 g/dL   HCT 41.6 36.0 - 46.0 %   MCV 95.9 80.0 - 100.0 fL   MCH 30.6 26.0 - 34.0 pg   MCHC 32.0 30.0 - 36.0 g/dL   RDW 13.6 11.5 - 15.5 %   Platelets 218 150 - 400 K/uL   nRBC 0.0 0.0 - 0.2 %   Neutrophils Relative % 68 %   Neutro Abs 3.6 1.7 - 7.7 K/uL   Lymphocytes Relative 21 %   Lymphs Abs 1.1 0.7 - 4.0 K/uL   Monocytes Relative 8 %   Monocytes Absolute 0.4 0.1 - 1.0 K/uL   Eosinophils Relative 2 %   Eosinophils Absolute 0.1 0.0 - 0.5 K/uL   Basophils Relative 1 %   Basophils Absolute 0.0 0.0 - 0.1 K/uL   Immature Granulocytes 0 %   Abs Immature Granulocytes 0.01 0.00 - 0.07 K/uL    Comment: Performed at [facility], [address] [place], [place] [address]  Basic metabolic panel     Status: Abnormal   Collection Time: 02/01/19  3:33 PM  Result Value Ref Range   Sodium 141 135 - 145 mmol/L   Potassium 4.0 3.5 - 5.1 mmol/L   Chloride 108  98 - 111 mmol/L   CO2 25 22 - 32 mmol/L   Glucose, Bld 91 70 - 99 mg/dL   BUN 15 8 - 23 mg/dL   Creatinine, Ser 1.610.79 0.44 - 1.00 mg/dL   Calcium 8.8 (L) 8.9 - 10.3 mg/dL   GFR calc non Af Amer >60 >60 mL/min   GFR calc Af Amer >60 >60 mL/min   Anion gap 8 5 - 15    Comment: Performed at [facility], [address] [place], [place] [address]  Urinalysis, Routine w reflex microscopic     Status: None   Collection Time: 02/01/19  4:15 PM  Result Value Ref Range   Color, Urine YELLOW YELLOW   APPearance CLEAR CLEAR   Specific Gravity, Urine 1.016 1.005 - 1.030   pH 5.0 5.0 - 8.0   Glucose, UA NEGATIVE NEGATIVE mg/dL   Hgb urine dipstick NEGATIVE NEGATIVE   Bilirubin Urine NEGATIVE NEGATIVE   Ketones, ur NEGATIVE NEGATIVE mg/dL   Protein, ur NEGATIVE NEGATIVE mg/dL   Nitrite NEGATIVE NEGATIVE   Leukocytes,Ua NEGATIVE NEGATIVE    Comment: Performed at [facility], [address] [place], [place] [address]   Dg Pelvis 1-2 Views  Result Date: 02/01/2019 CLINICAL DATA:  Pain status post fall EXAM: PELVIS - 1-2 VIEW COMPARISON:  None. FINDINGS: There is no evidence of pelvic fracture or diastasis. No pelvic bone lesions are seen. Advanced vascular calcifications are noted. IMPRESSION: Negative. Electronically Signed   By: Katherine Mantlehristopher  Green M.D.   On: 02/01/2019 15:05   Dg Tibia/fibula Right  Result Date: 02/01/2019 CLINICAL DATA:  Patient had a witnessed fall by daughter and Right knee pain, traveling down the right leg. [facility] for Right ankle fracture and left big toe fracture. History of dementia EXAM: RIGHT TIBIA AND FIBULA - 2 VIEW COMPARISON:  RIGHT ankle 01/31/2019 FINDINGS: LOWER leg is imaged through splinting material. There is no acute fracture or subluxation of the tibia or fibula. No posterior talar process fracture is not well seen. Degenerative changes are seen at the knee. IMPRESSION: No evidence for acute abnormality. Electronically Signed   By: Norva PavlovElizabeth  Brown M.D.   On: 02/01/2019 15:04   Dg Ankle Complete Right  Result Date: 01/31/2019 CLINICAL DATA:  Fall, foot and ankle bruising EXAM: RIGHT ANKLE - COMPLETE 3+ VIEW COMPARISON:  None. FINDINGS: The osseous structures appear diffusely demineralized which may limit detection of small or nondisplaced fractures. There is a minimally displaced fracture of the posterior talar process. Question a nondisplaced fracture lucency versus prominent vascular channel through the talar neck. Minimal ankle swelling. No sizable left ankle effusion. Ankle mortise is congruent. Enthesopathic changes are noted at the Achilles insertion upon the calcaneus. Midfoot and hindfoot alignment is grossly preserved though incompletely assessed on these nondedicated, nonweightbearing films. IMPRESSION: 1. Minimally displaced fracture of the posterior talar process. 2. Question nondisplaced  fracture lucency versus prominent vascular channel through the talar neck. Correlate with point tenderness. Consider dedicated foot radiographs. Electronically Signed   By: Kreg ShropshirePrice  DeHay M.D.   On: 01/31/2019 02:30   Dg Abd 1 View  Result Date: 01/31/2019 CLINICAL DATA:  Constipation, foot bruising EXAM: ABDOMEN - 1 VIEW COMPARISON:  None. FINDINGS: Bowel gas pattern is nonobstructive. Sternotomy suture is noted in the lower chest. Surgical clips are present at the diaphragmatic hiatus. Atherosclerotic plaque within the normal caliber aorta. There is severe levocurvature of the lumbar spine. Multilevel degenerative changes are present throughout  the spine. No visible fracture or traumatic abnormality of the pelvis is the incompletely evaluated particularly given external rotation of the right hip. IMPRESSION: Nonobstructive bowel gas pattern. Aortic Atherosclerosis (ICD10-I70.0). No visible traumatic abnormality although limited evaluation of the pelvis given partial collimation and external rotation of the right hip Electronically Signed   By: Kreg Shropshire M.D.   On: 01/31/2019 02:27   Ct Head Wo Contrast  Result Date: 02/01/2019 CLINICAL DATA:  Head trauma, witnessed fall, dementia EXAM: CT HEAD WITHOUT CONTRAST CT CERVICAL SPINE WITHOUT CONTRAST TECHNIQUE: Multidetector CT imaging of the head and cervical spine was performed following the standard protocol without intravenous contrast. Multiplanar CT image reconstructions of the cervical spine were also generated. COMPARISON:  01/31/2019 FINDINGS: CT HEAD FINDINGS Brain: No evidence of acute infarction, hemorrhage, hydrocephalus, extra-axial collection or mass lesion/mass effect. There is extensive periventricular and deep white matter hypodensity, global volume loss, and prominence of the lateral ventricles. Vascular: No hyperdense vessel or unexpected calcification. Skull: Normal. Negative for fracture or focal lesion. Sinuses/Orbits: No acute finding.  Other: None. CT CERVICAL SPINE FINDINGS Alignment: Normal. Skull base and vertebrae: No acute fracture. No primary bone lesion or focal pathologic process. Soft tissues and spinal canal: No prevertebral fluid or swelling. No visible canal hematoma. Disc levels: Fusion of C5-C6. Otherwise mild multilevel disc space height loss. Upper chest: Emphysema. Other: Carotid and aortic atherosclerosis. IMPRESSION: 1. No acute intracranial pathology. Advanced small-vessel white matter disease and global volume loss with enlargement of the ventricles ex vacuo. 2.  No fracture or static subluxation of the cervical spine. Electronically Signed   By: Lauralyn Primes M.D.   On: 02/01/2019 15:32   Ct Head Wo Contrast  Result Date: 01/31/2019 CLINICAL DATA:  Headache, posttraumatic, positive head strike EXAM: CT HEAD WITHOUT CONTRAST CT CERVICAL SPINE WITHOUT CONTRAST TECHNIQUE: Multidetector CT imaging of the head and cervical spine was performed following the standard protocol without intravenous contrast. Multiplanar CT image reconstructions of the cervical spine were also generated. COMPARISON:  CT 08/08/2018 FINDINGS: CT HEAD FINDINGS Brain: No evidence of acute infarction, hemorrhage, hydrocephalus, extra-axial collection or mass lesion/mass effect. Symmetric prominence of the ventricles, cisterns and sulci compatible with moderate parenchymal volume loss. Patchy and confluent areas of white matter hypoattenuation are most compatible with chronic microvascular angiopathy. Senescent mineralization of the basal ganglia. Vascular: Atherosclerotic calcification of the carotid siphons. No hyperdense vessel or dural sinus. Skull: Small amount of left parieto-occipital scalp infiltration. No subjacent calvarial fracture. Sinuses/Orbits: Hypo pneumatization of the right maxillary sinus is only partially evaluated on this exam. Similar appearance to prior head CT. Remaining paranasal sinuses and mastoids are predominantly clear. Small  amount of debris in the external auditory canals. Other: None. CT CERVICAL SPINE FINDINGS Alignment: Straightening of the normal cervical lordosis with slight focal reversal across the fused C5-6 levels. The non instrumented C5-6 fusion appears complete. Degenerative anterolisthesis of C4 on C5 is unchanged from comparison. No traumatic listhesis is identified. Atlantoaxial and craniocervical articulations are maintained. Skull base and vertebrae: No acute fracture. No primary bone lesion or focal pathologic process. Soft tissues and spinal canal: No pre or paravertebral fluid or swelling. No visible canal hematoma. Disc levels: Fusion of the C5-6 levels. Adjacent segment disease at C6-7 with cervical spondylitic endplate changes. Disc osteophyte complex at C4-5. No significant spinal canal stenosis. Moderate bilateral foraminal narrowing at C6-7, mild left and moderate right foraminal narrowing at C4-5. Upper chest: No acute abnormality in the upper chest or imaged lung  apices. Biapical pleuroparenchymal scarring and emphysematous changes. Atherosclerotic calcification of the aortic arch and great vessels. Calcification of the common carotids and carotid bifurcations. Surgical clips present at the sternal notch. Stable subcentimeter thyroid nodule in the posterior left lobe. Other: None. IMPRESSION: 1. No acute intracranial abnormality. 2. Small amount of left parieto-occipital scalp infiltration without subjacent calvarial fracture. 3. No acute cervical spine fracture. 4. 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 5. Aortic Atherosclerosis (ICD10-I70.0).  Carotid atherosclerosis. 6. Emphysema (ICD10-J43.9). 7. Subcentimeter nodule in the posterior left lobe thyroid. No further imaging required. This follows ACR consensus guidelines: Managing Incidental Thyroid Nodules Detected on Imaging: White Paper of the ACR Incidental Thyroid Findings  Committee. J Am Coll Radiol 2015; 12:143-150. Electronically Signed   By: Kreg ShropshirePrice  DeHay M.D.   On: 01/31/2019 02:25   Ct Cervical Spine Wo Contrast  Result Date: 02/01/2019 CLINICAL DATA:  Head trauma, witnessed fall, dementia EXAM: CT HEAD WITHOUT CONTRAST CT CERVICAL SPINE WITHOUT CONTRAST TECHNIQUE: Multidetector CT imaging of the head and cervical spine was performed following the standard protocol without intravenous contrast. Multiplanar CT image reconstructions of the cervical spine were also generated. COMPARISON:  01/31/2019 FINDINGS: CT HEAD FINDINGS Brain: No evidence of acute infarction, hemorrhage, hydrocephalus, extra-axial collection or mass lesion/mass effect. There is extensive periventricular and deep white matter hypodensity, global volume loss, and prominence of the lateral ventricles. Vascular: No hyperdense vessel or unexpected calcification. Skull: Normal. Negative for fracture or focal lesion. Sinuses/Orbits: No acute finding. Other: None. CT CERVICAL SPINE FINDINGS Alignment: Normal. Skull base and vertebrae: No acute fracture. No primary bone lesion or focal pathologic process. Soft tissues and spinal canal: No prevertebral fluid or swelling. No visible canal hematoma. Disc levels: Fusion of C5-C6. Otherwise mild multilevel disc space height loss. Upper chest: Emphysema. Other: Carotid and aortic atherosclerosis. IMPRESSION: 1. No acute intracranial pathology. Advanced small-vessel white matter disease and global volume loss with enlargement of the ventricles ex vacuo. 2.  No fracture or static subluxation of the cervical spine. Electronically Signed   By: Lauralyn PrimesAlex  Bibbey M.D.   On: 02/01/2019 15:32   Ct Cervical Spine Wo Contrast  Result Date: 01/31/2019 CLINICAL DATA:  Headache, posttraumatic, positive head strike EXAM: CT HEAD WITHOUT CONTRAST CT CERVICAL SPINE WITHOUT CONTRAST TECHNIQUE: Multidetector CT imaging of the head and cervical spine was performed following the standard  protocol without intravenous contrast. Multiplanar CT image reconstructions of the cervical spine were also generated. COMPARISON:  CT 08/08/2018 FINDINGS: CT HEAD FINDINGS Brain: No evidence of acute infarction, hemorrhage, hydrocephalus, extra-axial collection or mass lesion/mass effect. Symmetric prominence of the ventricles, cisterns and sulci compatible with moderate parenchymal volume loss. Patchy and confluent areas of white matter hypoattenuation are most compatible with chronic microvascular angiopathy. Senescent mineralization of the basal ganglia. Vascular: Atherosclerotic calcification of the carotid siphons. No hyperdense vessel or dural sinus. Skull: Small amount of left parieto-occipital scalp infiltration. No subjacent calvarial fracture. Sinuses/Orbits: Hypo pneumatization of the right maxillary sinus is only partially evaluated on this exam. Similar appearance to prior head CT. Remaining paranasal sinuses and mastoids are predominantly clear. Small amount of debris in the external auditory canals. Other: None. CT CERVICAL SPINE FINDINGS Alignment: Straightening of the normal cervical lordosis with slight focal reversal across the fused C5-6 levels. The non instrumented C5-6 fusion appears complete. Degenerative anterolisthesis of C4 on C5 is unchanged from comparison. No traumatic listhesis is identified. Atlantoaxial and craniocervical articulations are maintained. Skull base and  vertebrae: No acute fracture. No primary bone lesion or focal pathologic process. Soft tissues and spinal canal: No pre or paravertebral fluid or swelling. No visible canal hematoma. Disc levels: Fusion of the C5-6 levels. Adjacent segment disease at C6-7 with cervical spondylitic endplate changes. Disc osteophyte complex at C4-5. No significant spinal canal stenosis. Moderate bilateral foraminal narrowing at C6-7, mild left and moderate right foraminal narrowing at C4-5. Upper chest: No acute abnormality in the upper  chest or imaged lung apices. Biapical pleuroparenchymal scarring and emphysematous changes. Atherosclerotic calcification of the aortic arch and great vessels. Calcification of the common carotids and carotid bifurcations. Surgical clips present at the sternal notch. Stable subcentimeter thyroid nodule in the posterior left lobe. Other: None. IMPRESSION: 1. No acute intracranial abnormality. 2. Small amount of left parieto-occipital scalp infiltration without subjacent calvarial fracture. 3. No acute cervical spine fracture. 4. 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 5. Aortic Atherosclerosis (ICD10-I70.0).  Carotid atherosclerosis. 6. Emphysema (ICD10-J43.9). 7. Subcentimeter nodule in the posterior left lobe thyroid. No further imaging required. This follows ACR consensus guidelines: Managing Incidental Thyroid Nodules Detected on Imaging: White Paper of the ACR Incidental Thyroid Findings Committee. J Am Coll Radiol 2015; 12:143-150. Electronically Signed   By: Kreg Shropshire M.D.   On: 01/31/2019 02:25   Dg Knee Complete 4 Views Right  Result Date: 02/01/2019 CLINICAL DATA:  Pain status post fall EXAM: RIGHT KNEE - COMPLETE 4+ VIEW COMPARISON:  None. 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 There is mild joint space narrowing. There is no displaced fracture. No dislocation. However, evaluation is limited by osteopenia. Advanced vascular calcifications are noted. IMPRESSION: Degenerative changes without acute osseous finding. Electronically Signed   By: Katherine Mantle M.D.   On: 02/01/2019 15:03   Dg Foot Complete Left  Result Date: 01/31/2019 CLINICAL DATA:  Fall, foot bruising at the base of the great toe. EXAM: LEFT FOOT - COMPLETE 3+ VIEW COMPARISON:  None. FINDINGS: The osseous structures appear diffusely demineralized which may limit detection of small or nondisplaced fractures. Small nondisplaced fracture involving the medial  base of the first proximal phalanx. Mild adjacent soft tissue swelling. No other definite acute fracture or traumatic malalignment. Midfoot and hindfoot alignment is grossly preserved though incompletely assessed on nonweightbearing films. Small corticated os peroneum is noted. No subcutaneous gas or foreign body. Vascular calcium is noted in the soft tissues. Soft tissues are otherwise unremarkable. IMPRESSION: Small nondisplaced fracture involving the medial base of the first proximal phalanx. Electronically Signed   By: Kreg Shropshire M.D.   On: 01/31/2019 02:33   Dg Femur Min 2 Views Right  Result Date: 02/01/2019 CLINICAL DATA:  Pain status post fall EXAM: RIGHT FEMUR 2 VIEWS COMPARISON:  None. FINDINGS: There is no evidence of fracture or other focal bone lesions. Soft tissues are unremarkable. Advanced vascular calcifications are noted. IMPRESSION: Negative. Electronically Signed   By: Katherine Mantle M.D.   On: 02/01/2019 15:04    Pending Labs Unresulted Labs (From admission, onward)    Start     Ordered   02/08/19 0500  Creatinine, serum  (enoxaparin (LOVENOX)    CrCl >/= 30 ml/min)  Weekly,   R    Comments: while on enoxaparin therapy    02/01/19 1736   02/02/19 0500  Comprehensive metabolic panel  Tomorrow morning,   R     02/01/19 1736   02/02/19 0500  CBC  Tomorrow morning,   R  02/01/19 1736   02/01/19 1738  Vitamin B12  Once,   STAT     02/01/19 1737   02/01/19 1738  RPR  Once,   STAT     02/01/19 1737   02/01/19 1738  VITAMIN D 25 Hydroxy (Vit-D Deficiency, Fractures)  Once,   STAT     02/01/19 1738   02/01/19 1736  Magnesium  Once,   STAT     02/01/19 1736   02/01/19 1736  Phosphorus  Once,   STAT     02/01/19 1736   02/01/19 1736  TSH  Once,   STAT     02/01/19 1736   02/01/19 1733  CBC  (enoxaparin (LOVENOX)    CrCl >/= 30 ml/min)  Once,   STAT    Comments: Baseline for enoxaparin therapy IF NOT ALREADY DRAWN.  Notify MD if PLT < 100 K.    02/01/19 1736    02/01/19 1733  Creatinine, serum  (enoxaparin (LOVENOX)    CrCl >/= 30 ml/min)  Once,   STAT    Comments: Baseline for enoxaparin therapy IF NOT ALREADY DRAWN.    02/01/19 1736   02/01/19 1632  SARS Coronavirus 2 [facility] order, Performed in [facility] hospital lab) Nasopharyngeal Nasopharyngeal Swab  (Symptomatic/High Risk of Exposure/Tier 1 Patients Labs with Precautions)  Once,   STAT    Question Answer Comment  Is this test for diagnosis or screening Screening   Symptomatic for COVID-19 as defined by CDC No   Hospitalized for COVID-19 No   Admitted to ICU for COVID-19 No   Previously tested for COVID-19 No   Resident in a congregate (group) care setting No   Employed in healthcare setting No   Pregnant No      02/01/19 1631   02/01/19 1544  Urine Culture  Add-on,   AD     02/01/19 1543          Vitals/Pain Today's Vitals   02/01/19 1339 02/01/19 1510  BP:  (!) 167/84  Pulse:  72  Temp: 98.8 F (37.1 C)   TempSrc: Oral   SpO2:  96%    Isolation Precautions Airborne and Contact precautions  Medications Medications  sodium chloride 0.9 % bolus 500 mL (has no administration in time range)  enoxaparin (LOVENOX) injection 40 mg (has no administration in time range)  acetaminophen (TYLENOL) tablet 650 mg (has no administration in time range)    Or  acetaminophen (TYLENOL) suppository 650 mg (has no administration in time range)  polyethylene glycol (MIRALAX / GLYCOLAX) packet 17 g (has no administration in time range)    Mobility non-ambulatory High fall risk   Focused Assessments    R Recommendations: See Admitting Provider Note  Report given to:   Additional Notes:

## 2019-02-01 NOTE — ED Provider Notes (Signed)
Care assumed from [place], [place] at shift change.  See full note for HPI.  In summation 78 year old female, presents for evaluation of multiple falls.  Patient was seen in the ED approximately 2 days ago.  Has had 3 subsequent falls.  Patient diagnosed with Joselyn Glassman fracture on right foot and phalanx fracture on left foot.  Fall today was witnessed.  Daughter unable to take care of patient.  She has dementia at baseline.  Had some dysuria for the last few days which daughter has been treating with cranberry juice.  Imaging per previous provider negative.  Plan on follow-up with labs, UA.  Patient likely will require inpatient admission given multiple recent falls.   Physical Exam  BP (!) 167/84   Pulse 72   Temp 98.8 F (37.1 C) (Oral)   SpO2 96%   Physical Exam Vitals signs and nursing note reviewed.  Constitutional:      General: She is not in acute distress.    Appearance: She is well-developed. She is not toxic-appearing or diaphoretic.  HENT:     Head: Normocephalic and atraumatic.     Nose: Nose normal.     Mouth/Throat:     Mouth: Mucous membranes are dry.  Eyes:     Pupils: Pupils are equal, round, and reactive to light.  Neck:     Musculoskeletal: Normal range of motion and neck supple.  Cardiovascular:     Rate and Rhythm: Normal rate and regular rhythm.     Pulses: Normal pulses.     Heart sounds: Normal heart sounds.  Pulmonary:     Effort: Pulmonary effort is normal. No respiratory distress.     Breath sounds: Normal breath sounds.  Abdominal:     General: Bowel sounds are normal. There is no distension.     Palpations: Abdomen is soft.     Tenderness: There is no abdominal tenderness. There is no guarding or rebound.  Musculoskeletal: Normal range of motion.     Comments: Right lower extremity in splint.  Brisk capillary refill 2+ DP, PT pulses bilaterally.  Wiggles toes.  Moves all 4 extremities without difficulty.  Skin:    General: Skin is warm and dry.      Comments: No rashes, lesions or contusions.  Brisk capillary refill.  Neurological:     Mental Status: She is alert.     Comments: Oriented to person however not place and time.  She is unable to follow most commands.    ED Course/Procedures     Procedures Labs Reviewed  BASIC METABOLIC PANEL - Abnormal; Notable for the following components:      Result Value   Calcium 8.8 (*)    All other components within normal limits  URINE CULTURE  SARS CORONAVIRUS 2 (HOSPITAL ORDER, PERFORMED IN [place] [facility])  URINALYSIS, ROUTINE W REFLEX MICROSCOPIC  CBC WITH DIFFERENTIAL/PLATELET  Dg Pelvis 1-2 Views  Result Date: 02/01/2019 CLINICAL DATA:  Pain status post fall EXAM: PELVIS - 1-2 VIEW COMPARISON:  None. FINDINGS: There is no evidence of pelvic fracture or diastasis. No pelvic bone lesions are seen. Advanced vascular calcifications are noted. IMPRESSION: Negative. Electronically Signed   By: Katherine Mantle M.D.   On: 02/01/2019 15:05   Dg Tibia/fibula Right  Result Date: 02/01/2019 CLINICAL DATA:  Patient had a witnessed fall by daughter and Right knee pain, traveling down the right leg. Seen [facility] for Right ankle fracture and left big toe fracture. History of dementia  EXAM: RIGHT TIBIA AND FIBULA - 2 VIEW COMPARISON:  RIGHT ankle 01/31/2019 FINDINGS: LOWER leg is imaged through splinting material. There is no acute fracture or subluxation of the tibia or fibula. No posterior talar process fracture is not well seen. Degenerative changes are seen at the knee. IMPRESSION: No evidence for acute abnormality. Electronically Signed   By: Nolon Nations M.D.   On: 02/01/2019 15:04   Dg Ankle Complete Right  Result Date: 01/31/2019 CLINICAL DATA:  Fall, foot and ankle bruising EXAM: RIGHT ANKLE - COMPLETE 3+ VIEW COMPARISON:  None. FINDINGS: The osseous structures appear diffusely demineralized which may limit detection of small or nondisplaced fractures. There is a  minimally displaced fracture of the posterior talar process. Question a nondisplaced fracture lucency versus prominent vascular channel through the talar neck. Minimal ankle swelling. No sizable left ankle effusion. Ankle mortise is congruent. Enthesopathic changes are noted at the Achilles insertion upon the calcaneus. Midfoot and hindfoot alignment is grossly preserved though incompletely assessed on these nondedicated, nonweightbearing films. IMPRESSION: 1. Minimally displaced fracture of the posterior talar process. 2. Question nondisplaced fracture lucency versus prominent vascular channel through the talar neck. Correlate with point tenderness. Consider dedicated foot radiographs. Electronically Signed   By: Lovena Le M.D.   On: 01/31/2019 02:30   Dg Abd 1 View  Result Date: 01/31/2019 CLINICAL DATA:  Constipation, foot bruising EXAM: ABDOMEN - 1 VIEW COMPARISON:  None. FINDINGS: Bowel gas pattern is nonobstructive. Sternotomy suture is noted in the lower chest. Surgical clips are present at the diaphragmatic hiatus. Atherosclerotic plaque within the normal caliber aorta. There is severe levocurvature of the lumbar spine. Multilevel degenerative changes are present throughout the spine. No visible fracture or traumatic abnormality of the pelvis is the incompletely evaluated particularly given external rotation of the right hip. IMPRESSION: Nonobstructive bowel gas pattern. Aortic Atherosclerosis (ICD10-I70.0). No visible traumatic abnormality although limited evaluation of the pelvis given partial collimation and external rotation of the right hip Electronically Signed   By: Lovena Le M.D.   On: 01/31/2019 02:27   Ct Head Wo Contrast  Result Date: 02/01/2019 CLINICAL DATA:  Head trauma, witnessed fall, dementia EXAM: CT HEAD WITHOUT CONTRAST CT CERVICAL SPINE WITHOUT CONTRAST TECHNIQUE: Multidetector CT imaging of the head and cervical spine was performed following the standard protocol without  intravenous contrast. Multiplanar CT image reconstructions of the cervical spine were also generated. COMPARISON:  01/31/2019 FINDINGS: CT HEAD FINDINGS Brain: No evidence of acute infarction, hemorrhage, hydrocephalus, extra-axial collection or mass lesion/mass effect. There is extensive periventricular and deep white matter hypodensity, global volume loss, and prominence of the lateral ventricles. Vascular: No hyperdense vessel or unexpected calcification. Skull: Normal. Negative for fracture or focal lesion. Sinuses/Orbits: No acute finding. Other: None. CT CERVICAL SPINE FINDINGS Alignment: Normal. Skull base and vertebrae: No acute fracture. No primary bone lesion or focal pathologic process. Soft tissues and spinal canal: No prevertebral fluid or swelling. No visible canal hematoma. Disc levels: Fusion of C5-C6. Otherwise mild multilevel disc space height loss. Upper chest: Emphysema. Other: Carotid and aortic atherosclerosis. IMPRESSION: 1. No acute intracranial pathology. Advanced small-vessel white matter disease and global volume loss with enlargement of the ventricles ex vacuo. 2.  No fracture or static subluxation of the cervical spine. Electronically Signed   By: Eddie Candle M.D.   On: 02/01/2019 15:32   Ct Head Wo Contrast  Result Date: 01/31/2019 CLINICAL DATA:  Headache, posttraumatic, positive head strike EXAM: CT HEAD WITHOUT CONTRAST CT CERVICAL SPINE  WITHOUT CONTRAST TECHNIQUE: Multidetector CT imaging of the head and cervical spine was performed following the standard protocol without intravenous contrast. Multiplanar CT image reconstructions of the cervical spine were also generated. COMPARISON:  CT 08/08/2018 FINDINGS: CT HEAD FINDINGS Brain: No evidence of acute infarction, hemorrhage, hydrocephalus, extra-axial collection or mass lesion/mass effect. Symmetric prominence of the ventricles, cisterns and sulci compatible with moderate parenchymal volume loss. Patchy and confluent areas of  white matter hypoattenuation are most compatible with chronic microvascular angiopathy. Senescent mineralization of the basal ganglia. Vascular: Atherosclerotic calcification of the carotid siphons. No hyperdense vessel or dural sinus. Skull: Small amount of left parieto-occipital scalp infiltration. No subjacent calvarial fracture. Sinuses/Orbits: Hypo pneumatization of the right maxillary sinus is only partially evaluated on this exam. Similar appearance to prior head CT. Remaining paranasal sinuses and mastoids are predominantly clear. Small amount of debris in the external auditory canals. Other: None. CT CERVICAL SPINE FINDINGS Alignment: Straightening of the normal cervical lordosis with slight focal reversal across the fused C5-6 levels. The non instrumented C5-6 fusion appears complete. Degenerative anterolisthesis of C4 on C5 is unchanged from comparison. No traumatic listhesis is identified. Atlantoaxial and craniocervical articulations are maintained. Skull base and vertebrae: No acute fracture. No primary bone lesion or focal pathologic process. Soft tissues and spinal canal: No pre or paravertebral fluid or swelling. No visible canal hematoma. Disc levels: Fusion of the C5-6 levels. Adjacent segment disease at C6-7 with cervical spondylitic endplate changes. Disc osteophyte complex at C4-5. No significant spinal canal stenosis. Moderate bilateral foraminal narrowing at C6-7, mild left and moderate right foraminal narrowing at C4-5. Upper chest: No acute abnormality in the upper chest or imaged lung apices. Biapical pleuroparenchymal scarring and emphysematous changes. Atherosclerotic calcification of the aortic arch and great vessels. Calcification of the common carotids and carotid bifurcations. Surgical clips present at the sternal notch. Stable subcentimeter thyroid nodule in the posterior left lobe. Other: None. IMPRESSION: 1. No acute intracranial abnormality. 2. Small amount of left  parieto-occipital scalp infiltration without subjacent calvarial fracture. 3. No acute cervical spine fracture. 4. 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 5. Aortic Atherosclerosis (ICD10-I70.0).  Carotid atherosclerosis. 6. Emphysema (ICD10-J43.9). 7. Subcentimeter nodule in the posterior left lobe thyroid. No further imaging required. This follows ACR consensus guidelines: Managing Incidental Thyroid Nodules Detected on Imaging: White Paper of the ACR Incidental Thyroid Findings Committee. J Am Coll Radiol 2015; 12:143-150. Electronically Signed   By: Kreg ShropshirePrice  DeHay M.D.   On: 01/31/2019 02:25   Ct Cervical Spine Wo Contrast  Result Date: 02/01/2019 CLINICAL DATA:  Head trauma, witnessed fall, dementia EXAM: CT HEAD WITHOUT CONTRAST CT CERVICAL SPINE WITHOUT CONTRAST TECHNIQUE: Multidetector CT imaging of the head and cervical spine was performed following the standard protocol without intravenous contrast. Multiplanar CT image reconstructions of the cervical spine were also generated. COMPARISON:  01/31/2019 FINDINGS: CT HEAD FINDINGS Brain: No evidence of acute infarction, hemorrhage, hydrocephalus, extra-axial collection or mass lesion/mass effect. There is extensive periventricular and deep white matter hypodensity, global volume loss, and prominence of the lateral ventricles. Vascular: No hyperdense vessel or unexpected calcification. Skull: Normal. Negative for fracture or focal lesion. Sinuses/Orbits: No acute finding. Other: None. CT CERVICAL SPINE FINDINGS Alignment: Normal. Skull base and vertebrae: No acute fracture. No primary bone lesion or focal pathologic process. Soft tissues and spinal canal: No prevertebral fluid or swelling. No visible canal hematoma. Disc levels: Fusion of C5-C6. Otherwise mild multilevel disc space height loss.  Upper chest: Emphysema. Other: Carotid and aortic atherosclerosis. IMPRESSION: 1. No acute  intracranial pathology. Advanced small-vessel white matter disease and global volume loss with enlargement of the ventricles ex vacuo. 2.  No fracture or static subluxation of the cervical spine. Electronically Signed   By: Lauralyn Primes M.D.   On: 02/01/2019 15:32   Ct Cervical Spine Wo Contrast  Result Date: 01/31/2019 CLINICAL DATA:  Headache, posttraumatic, positive head strike EXAM: CT HEAD WITHOUT CONTRAST CT CERVICAL SPINE WITHOUT CONTRAST TECHNIQUE: Multidetector CT imaging of the head and cervical spine was performed following the standard protocol without intravenous contrast. Multiplanar CT image reconstructions of the cervical spine were also generated. COMPARISON:  CT 08/08/2018 FINDINGS: CT HEAD FINDINGS Brain: No evidence of acute infarction, hemorrhage, hydrocephalus, extra-axial collection or mass lesion/mass effect. Symmetric prominence of the ventricles, cisterns and sulci compatible with moderate parenchymal volume loss. Patchy and confluent areas of white matter hypoattenuation are most compatible with chronic microvascular angiopathy. Senescent mineralization of the basal ganglia. Vascular: Atherosclerotic calcification of the carotid siphons. No hyperdense vessel or dural sinus. Skull: Small amount of left parieto-occipital scalp infiltration. No subjacent calvarial fracture. Sinuses/Orbits: Hypo pneumatization of the right maxillary sinus is only partially evaluated on this exam. Similar appearance to prior head CT. Remaining paranasal sinuses and mastoids are predominantly clear. Small amount of debris in the external auditory canals. Other: None. CT CERVICAL SPINE FINDINGS Alignment: Straightening of the normal cervical lordosis with slight focal reversal across the fused C5-6 levels. The non instrumented C5-6 fusion appears complete. Degenerative anterolisthesis of C4 on C5 is unchanged from comparison. No traumatic listhesis is identified. Atlantoaxial and craniocervical articulations  are maintained. Skull base and vertebrae: No acute fracture. No primary bone lesion or focal pathologic process. Soft tissues and spinal canal: No pre or paravertebral fluid or swelling. No visible canal hematoma. Disc levels: Fusion of the C5-6 levels. Adjacent segment disease at C6-7 with cervical spondylitic endplate changes. Disc osteophyte complex at C4-5. No significant spinal canal stenosis. Moderate bilateral foraminal narrowing at C6-7, mild left and moderate right foraminal narrowing at C4-5. Upper chest: No acute abnormality in the upper chest or imaged lung apices. Biapical pleuroparenchymal scarring and emphysematous changes. Atherosclerotic calcification of the aortic arch and great vessels. Calcification of the common carotids and carotid bifurcations. Surgical clips present at the sternal notch. Stable subcentimeter thyroid nodule in the posterior left lobe. Other: None. IMPRESSION: 1. No acute intracranial abnormality. 2. Small amount of left parieto-occipital scalp infiltration without subjacent calvarial fracture. 3. No acute cervical spine fracture. 4. 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 5. Aortic Atherosclerosis (ICD10-I70.0).  Carotid atherosclerosis. 6. Emphysema (ICD10-J43.9). 7. Subcentimeter nodule in the posterior left lobe thyroid. No further imaging required. This follows ACR consensus guidelines: Managing Incidental Thyroid Nodules Detected on Imaging: White Paper of the ACR Incidental Thyroid Findings Committee. J Am Coll Radiol 2015; 12:143-150. Electronically Signed   By: Kreg Shropshire M.D.   On: 01/31/2019 02:25   Dg Shoulder Left  Result Date: 01/30/2019 CLINICAL DATA:  Left shoulder pain after fall today. EXAM: LEFT SHOULDER - 2+ VIEW COMPARISON:  None. FINDINGS: There is no evidence of fracture or dislocation. There is no evidence of arthropathy or other focal bone abnormality. Soft tissues are  unremarkable. IMPRESSION: Negative. Electronically Signed   By: Lupita Raider M.D.   On: 01/30/2019 16:10   Dg Knee Complete 4 Views Right  Result Date: 02/01/2019 CLINICAL DATA:  Pain status post fall EXAM: RIGHT KNEE - COMPLETE 4+ VIEW COMPARISON:  None. 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 There is mild joint space narrowing. There is no displaced fracture. No dislocation. However, evaluation is limited by osteopenia. Advanced vascular calcifications are noted. IMPRESSION: Degenerative changes without acute osseous finding. Electronically Signed   By: Katherine Mantle M.D.   On: 02/01/2019 15:03   Dg Foot Complete Left  Result Date: 01/31/2019 CLINICAL DATA:  Fall, foot bruising at the base of the great toe. EXAM: LEFT FOOT - COMPLETE 3+ VIEW COMPARISON:  None. FINDINGS: The osseous structures appear diffusely demineralized which may limit detection of small or nondisplaced fractures. Small nondisplaced fracture involving the medial base of the first proximal phalanx. Mild adjacent soft tissue swelling. No other definite acute fracture or traumatic malalignment. Midfoot and hindfoot alignment is grossly preserved though incompletely assessed on nonweightbearing films. Small corticated os peroneum is noted. No subcutaneous gas or foreign body. Vascular calcium is noted in the soft tissues. Soft tissues are otherwise unremarkable. IMPRESSION: Small nondisplaced fracture involving the medial base of the first proximal phalanx. Electronically Signed   By: Kreg Shropshire M.D.   On: 01/31/2019 02:33   Dg Hip Unilat With Pelvis 2-3 Views Left  Result Date: 01/30/2019 CLINICAL DATA:  Left hip pain after fall today. EXAM: DG HIP (WITH OR WITHOUT PELVIS) 2-3V LEFT COMPARISON:  None. FINDINGS: There is no evidence of hip fracture or dislocation. There is no evidence of arthropathy or other focal bone abnormality. IMPRESSION: Negative. Electronically Signed   By: Lupita Raider M.D.   On: 01/30/2019 16:11   Dg  Femur Min 2 Views Right  Result Date: 02/01/2019 CLINICAL DATA:  Pain status post fall EXAM: RIGHT FEMUR 2 VIEWS COMPARISON:  None. FINDINGS: There is no evidence of fracture or other focal bone lesions. Soft tissues are unremarkable. Advanced vascular calcifications are noted. IMPRESSION: Negative. Electronically Signed   By: Katherine Mantle M.D.   On: 02/01/2019 15:04   MDM  Imaging negative. Plan to check labs, EKG, urinalysis. Will likely need inpatient managment for recurrent falls.  [facility]- [facility]  1600: Per daughter, Mother with overall decline over the last weeks. Not eating and drinking. Was previously walker with difficulty. Daughter had bought wheel chair due to decreased immobility.  Since recent falls patient is not able to pivot, stand to get out of wheelchair.  Has had 3 falls since original fall.  She is nonweightbearing on her right lower extremity and has 2 fractured left lower extremity with already poor mobility.  Daughter is not able to take care of patient at home due to her decreased mobility and frequent falls.  Mucous membranes are dry.  I will give small fluid bolus.  Heart and lungs clear.  Abdomen soft, nontender without rebound or guarding.  She does move all 4 extremities spontaneously.  Right lower extremity in splint, 2+ DP, PT pulses equal bilateral.  Brisk capillary refill.  Labs and imagine personally reviewed: Labs at baseline Imaging without acute findings Urine: negative, culture sent  Patient likely needs inpatient management for PT, OT, failure to thrive and possible skilled nursing facility placement at dc.  I discussed this with family.  They are in agreement that patient is not safe to take home at this time.  Plan for consult hospitalist for admission.  1700: Consulted with Dr. Jacqulyn Bath who agrees to evaluate patient for admission.  COVID pending.  The patient appears reasonably stabilized for admission considering  the current resources,  flow, and capabilities available in the ED at this time, and I doubt any other [facility] further screening and/or treatment in the ED prior to admission.  Patient has been discussed with attending physician who agrees with above treatment, plan and disposition.        Ohm Dentler A, PA-C 02/01/19 1709    Wynetta FinesMessick, Peter C, MD 02/01/19 1747

## 2019-02-01 NOTE — ED Triage Notes (Signed)
Patient had a witnessed fall by daughter and Right knee pain, traveling down the right leg. Seen yesterday at [facility] for Right ankle fracture and left big toe fracture. History of dementia. Denies hitting head.

## 2019-02-01 NOTE — ED Notes (Signed)
Patient transported to X-ray 

## 2019-02-01 NOTE — H&P (Signed)
History and Physical    Jasmine Landsberglena Berthold WUJ:[id number] DOB: 25-Dec-1940 DOA: 02/01/2019  PCP: Laurann MontanaWhite, Cynthia, MD  Patient coming from: Home  I have personally briefly reviewed patient's [facility]  Chief Complaint: Recurrent falls  HPI: Jasmine Carrillo is a 78 y.o. female with medical history significant of coronary artery disease status post CABG in 2008, hypertension, dementia, hyperlipidemia, chronic constipation, depression brought by her daughter to emergency department due to concern of recurrent falls.  As per daughter: Patient had 3 subsequent falls yesterday and [facility] and was Pt diagnosed with Joselyn Glassmanyler fracture in right ankle and phalanx fracture on left great toe.  Short leg splint to right leg and postop boot applied to left leg at [facility], advised nonweightbearing for 4 to 6 weeks, she discharged home in a stable condition.  Daughter reports that patient fell again this morning when she came back from the emergency department on the steps in front of the house-daughter was able to managed to get her inside the home however daughter noticed that patient is more confused, has decrease appetite and complaining of headache and leg burning.daughter called EMS and brought patient to emergency department for further evaluation and management.    Daughter reports worsening of dementia, decreased appetite since 2 weeks.  She was evaluated by neurologist outpatient and was told that patient has Parkinson disease?.  Due to concern of worsening dementia and  non-weightbearing status-she will likely requires skilled nursing facility placement for the further management at the time of discharge.  ED Course: Patient lying comfortably on the bed.  Alert, oriented x1 only to person.  Daughter at bedside.  Vitals stable.  CT head and cervical spine came back negative for acute findings.   Review of Systems: As per HPI otherwise  negative.    Past Medical History:  Diagnosis Date   Anxiety    Depression     Past Surgical History:  Procedure Laterality Date   CORONARY ARTERY BYPASS GRAFT     NECK SURGERY     Neck bone replacement      reports that she quit smoking about 12 years ago. She has never used smokeless tobacco. She reports current alcohol use. She reports that she does not use drugs.  No Known Allergies  No family history on file.  Prior to Admission medications   Medication Sig Start Date End Date Taking? Authorizing Provider  Artificial Tear Ointment (DRY EYES OP) Apply 1 drop to eye daily as needed (dry eyes).    [provider]  ASPIRIN 81 PO Take 1 tablet by mouth daily.    [provider]  CALCIUM PO Take 1 tablet by mouth daily.    [provider]  carvedilol (COREG) 12.5 MG tablet Take 1 tablet by mouth every evening.  03/23/11   [provider]  carvedilol (COREG) 6.25 MG tablet Take 6.25 mg by mouth 2 (two) times daily. 12/26/18   [provider]  divalproex (DEPAKOTE) 250 MG DR tablet Take 1 tablet (250 mg total) by mouth at bedtime. Patient not taking: Reported on 08/08/2018 06/16/18   York SpanielWillis, Charles K, MD  donepezil (ARICEPT) 10 MG tablet Take 1 tablet by mouth every evening.  03/23/11   [provider]  Ensure (ENSURE) Take 237 mLs by mouth daily.    [provider]  escitalopram (LEXAPRO) 20 MG tablet Take 20 mg by mouth daily. 08/02/18   [provider]  HYDROcodone-acetaminophen (NORCO) 7.5-325 MG tablet Take 0.5-1 tablets by mouth 4 (four) times daily as needed for moderate pain.     [provider]  memantine (NAMENDA) 10 MG tablet Take 1 tablet by mouth 2 (two) times daily. 02/24/15   [provider]  Multiple Vitamins-Minerals (MULTIVITAMIN PO) Take 1 tablet by mouth daily.    [provider]  simvastatin (ZOCOR) 40 MG tablet Take 40 mg by mouth every evening. 06/30/18   [provider]  tretinoin (RETIN-A) 0.025 % cream Apply 1 application topically every evening. 07/31/18   [provider]  Tretinoin, Facial Wrinkles, (TRETINOIN, EMOLLIENT,) 0.05 % CREA Take 1 application by mouth as needed. 06/20/11   [provider]    Physical Exam: Vitals:   02/01/19 1339 02/01/19 1510  BP:  (!) 167/84  Pulse:  72  Temp: 98.8 F (37.1 C)   TempSrc: Oral   SpO2:  96%    Constitutional: NAD, calm, comfortable Vitals:   02/01/19 1339 02/01/19 1510  BP:  (!) 167/84  Pulse:  72  Temp: 98.8 F (37.1 C)   TempSrc: Oral   SpO2:  96%   General: Patient is alert, oriented to only person, following commands, not in acute distress. Eyes: PERRL, lids and conjunctivae normal ENMT: Mucous membranes are moist. Posterior pharynx clear of any exudate or lesions.Normal dentition.  Neck: normal, supple, no masses, no thyromegaly Respiratory: clear to auscultation bilaterally, no wheezing, no crackles. Normal respiratory effort. No accessory muscle use.  Cardiovascular: Regular rate and rhythm, no murmurs / rubs / gallops. No extremity edema. 2+ pedal pulses. No carotid bruits.  Abdomen: no tenderness, no masses palpated. No hepatosplenomegaly. Bowel sounds positive.  Musculoskeletal: no clubbing / cyanosis.  Right leg: Short leg splint noted on right leg.  Left foot: Bruises noted below left great toe.  No decreased range of motion noted. Skin: no rashes, lesions, ulcers. No induration Neurologic: CN 2-12 grossly intact. Sensation intact, DTR normal. Strength 5/5 in all 4.  Psychiatric: Alert but oriented x1.  Has resting tremors in the right hand.  Labs on Admission: I have personally reviewed following labs and imaging studies  CBC: Recent Labs  Lab 02/01/19 1533  WBC 5.3  NEUTROABS 3.6  HGB 13.3  HCT 41.6  MCV 95.9  PLT 218   Basic Metabolic Panel: Recent Labs  Lab 02/01/19 1533  NA 141  K 4.0  CL 108  CO2 25  GLUCOSE 91  BUN 15    CREATININE 0.79  CALCIUM 8.8*   GFR: CrCl cannot be calculated (Unknown ideal weight.). Liver Function Tests: No results for input(s): AST, ALT, ALKPHOS, BILITOT, PROT, ALBUMIN in the last 168 hours. No results for input(s): LIPASE, AMYLASE in the last 168 hours. No results for input(s): AMMONIA in the last 168 hours. Coagulation Profile: No results for input(s): INR, PROTIME in the last 168 hours. Cardiac Enzymes: No results for input(s): CKTOTAL, CKMB, CKMBINDEX, TROPONINI in the last 168 hours. BNP (last 3 results) No results for input(s): PROBNP in the last 8760 hours. HbA1C: No results for input(s): HGBA1C in the last 72 hours. CBG: No results for input(s): GLUCAP in the last 168 hours. Lipid Profile: No results for input(s): CHOL, HDL, LDLCALC, TRIG, CHOLHDL, LDLDIRECT in the last 72 hours. Thyroid Function Tests: No results for input(s): TSH, T4TOTAL, FREET4, T3FREE, THYROIDAB in the last 72 hours. Anemia Panel: No results for input(s): VITAMINB12, FOLATE, FERRITIN, TIBC, IRON, RETICCTPCT in the last 72 hours. Urine analysis:  Component Value Date/Time   COLORURINE YELLOW 02/01/2019 1615   APPEARANCEUR CLEAR 02/01/2019 1615   LABSPEC 1.016 02/01/2019 1615   PHURINE 5.0 02/01/2019 1615   GLUCOSEU NEGATIVE 02/01/2019 1615   HGBUR NEGATIVE 02/01/2019 1615   BILIRUBINUR NEGATIVE 02/01/2019 1615   KETONESUR NEGATIVE 02/01/2019 1615   PROTEINUR NEGATIVE 02/01/2019 1615   NITRITE NEGATIVE 02/01/2019 1615   LEUKOCYTESUR NEGATIVE 02/01/2019 1615    Radiological Exams on Admission: Dg Pelvis 1-2 Views  Result Date: 02/01/2019 CLINICAL DATA:  Pain status post fall EXAM: PELVIS - 1-2 VIEW COMPARISON:  None. FINDINGS: There is no evidence of pelvic fracture or diastasis. No pelvic bone lesions are seen. Advanced vascular calcifications are noted. IMPRESSION: Negative. Electronically Signed   By: Katherine Mantle M.D.   On: 02/01/2019 15:05   Dg Tibia/fibula  Right  Result Date: 02/01/2019 CLINICAL DATA:  Patient had a witnessed fall by daughter and Right knee pain, traveling down the right leg. Seen yesterday at [facility] for Right ankle fracture and left big toe fracture. History of dementia EXAM: RIGHT TIBIA AND FIBULA - 2 VIEW COMPARISON:  RIGHT ankle 01/31/2019 FINDINGS: LOWER leg is imaged through splinting material. There is no acute fracture or subluxation of the tibia or fibula. No posterior talar process fracture is not well seen. Degenerative changes are seen at the knee. IMPRESSION: No evidence for acute abnormality. Electronically Signed   By: Norva Pavlov M.D.   On: 02/01/2019 15:04   Dg Ankle Complete Right  Result Date: 01/31/2019 CLINICAL DATA:  Fall, foot and ankle bruising EXAM: RIGHT ANKLE - COMPLETE 3+ VIEW COMPARISON:  None. FINDINGS: The osseous structures appear diffusely demineralized which may limit detection of small or nondisplaced fractures. There is a minimally displaced fracture of the posterior talar process. Question a nondisplaced fracture lucency versus prominent vascular channel through the talar neck. Minimal ankle swelling. No sizable left ankle effusion. Ankle mortise is congruent. Enthesopathic changes are noted at the Achilles insertion upon the calcaneus. Midfoot and hindfoot alignment is grossly preserved though incompletely assessed on these nondedicated, nonweightbearing films. IMPRESSION: 1. Minimally displaced fracture of the posterior talar process. 2. Question nondisplaced fracture lucency versus prominent vascular channel through the talar neck. Correlate with point tenderness. Consider dedicated foot radiographs. Electronically Signed   By: Kreg Shropshire M.D.   On: 01/31/2019 02:30   Dg Abd 1 View  Result Date: 01/31/2019 CLINICAL DATA:  Constipation, foot bruising EXAM: ABDOMEN - 1 VIEW COMPARISON:  None. FINDINGS: Bowel gas pattern is nonobstructive. Sternotomy suture is noted in the lower chest. Surgical  clips are present at the diaphragmatic hiatus. Atherosclerotic plaque within the normal caliber aorta. There is severe levocurvature of the lumbar spine. Multilevel degenerative changes are present throughout the spine. No visible fracture or traumatic abnormality of the pelvis is the incompletely evaluated particularly given external rotation of the right hip. IMPRESSION: Nonobstructive bowel gas pattern. Aortic Atherosclerosis (ICD10-I70.0). No visible traumatic abnormality although limited evaluation of the pelvis given partial collimation and external rotation of the right hip Electronically Signed   By: Kreg Shropshire M.D.   On: 01/31/2019 02:27   Ct Head Wo Contrast  Result Date: 02/01/2019 CLINICAL DATA:  Head trauma, witnessed fall, dementia EXAM: CT HEAD WITHOUT CONTRAST CT CERVICAL SPINE WITHOUT CONTRAST TECHNIQUE: Multidetector CT imaging of the head and cervical spine was performed following the standard protocol without intravenous contrast. Multiplanar CT image reconstructions of the cervical spine were also generated. COMPARISON:  01/31/2019 FINDINGS: CT HEAD FINDINGS  Brain: No evidence of acute infarction, hemorrhage, hydrocephalus, extra-axial collection or mass lesion/mass effect. There is extensive periventricular and deep white matter hypodensity, global volume loss, and prominence of the lateral ventricles. Vascular: No hyperdense vessel or unexpected calcification. Skull: Normal. Negative for fracture or focal lesion. Sinuses/Orbits: No acute finding. Other: None. CT CERVICAL SPINE FINDINGS Alignment: Normal. Skull base and vertebrae: No acute fracture. No primary bone lesion or focal pathologic process. Soft tissues and spinal canal: No prevertebral fluid or swelling. No visible canal hematoma. Disc levels: Fusion of C5-C6. Otherwise mild multilevel disc space height loss. Upper chest: Emphysema. Other: Carotid and aortic atherosclerosis. IMPRESSION: 1. No acute intracranial pathology.  Advanced small-vessel white matter disease and global volume loss with enlargement of the ventricles ex vacuo. 2.  No fracture or static subluxation of the cervical spine. Electronically Signed   By: Lauralyn Primes M.D.   On: 02/01/2019 15:32   Ct Head Wo Contrast  Result Date: 01/31/2019 CLINICAL DATA:  Headache, posttraumatic, positive head strike EXAM: CT HEAD WITHOUT CONTRAST CT CERVICAL SPINE WITHOUT CONTRAST TECHNIQUE: Multidetector CT imaging of the head and cervical spine was performed following the standard protocol without intravenous contrast. Multiplanar CT image reconstructions of the cervical spine were also generated. COMPARISON:  CT 08/08/2018 FINDINGS: CT HEAD FINDINGS Brain: No evidence of acute infarction, hemorrhage, hydrocephalus, extra-axial collection or mass lesion/mass effect. Symmetric prominence of the ventricles, cisterns and sulci compatible with moderate parenchymal volume loss. Patchy and confluent areas of white matter hypoattenuation are most compatible with chronic microvascular angiopathy. Senescent mineralization of the basal ganglia. Vascular: Atherosclerotic calcification of the carotid siphons. No hyperdense vessel or dural sinus. Skull: Small amount of left parieto-occipital scalp infiltration. No subjacent calvarial fracture. Sinuses/Orbits: Hypo pneumatization of the right maxillary sinus is only partially evaluated on this exam. Similar appearance to prior head CT. Remaining paranasal sinuses and mastoids are predominantly clear. Small amount of debris in the external auditory canals. Other: None. CT CERVICAL SPINE FINDINGS Alignment: Straightening of the normal cervical lordosis with slight focal reversal across the fused C5-6 levels. The non instrumented C5-6 fusion appears complete. Degenerative anterolisthesis of C4 on C5 is unchanged from comparison. No traumatic listhesis is identified. Atlantoaxial and craniocervical articulations are maintained. Skull base and  vertebrae: No acute fracture. No primary bone lesion or focal pathologic process. Soft tissues and spinal canal: No pre or paravertebral fluid or swelling. No visible canal hematoma. Disc levels: Fusion of the C5-6 levels. Adjacent segment disease at C6-7 with cervical spondylitic endplate changes. Disc osteophyte complex at C4-5. No significant spinal canal stenosis. Moderate bilateral foraminal narrowing at C6-7, mild left and moderate right foraminal narrowing at C4-5. Upper chest: No acute abnormality in the upper chest or imaged lung apices. Biapical pleuroparenchymal scarring and emphysematous changes. Atherosclerotic calcification of the aortic arch and great vessels. Calcification of the common carotids and carotid bifurcations. Surgical clips present at the sternal notch. Stable subcentimeter thyroid nodule in the posterior left lobe. Other: None. IMPRESSION: 1. No acute intracranial abnormality. 2. Small amount of left parieto-occipital scalp infiltration without subjacent calvarial fracture. 3. No acute cervical spine fracture. 4. 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 5. Aortic Atherosclerosis (ICD10-I70.0).  Carotid atherosclerosis. 6. Emphysema (ICD10-J43.9). 7. Subcentimeter nodule in the posterior left lobe thyroid. No further imaging required. This follows ACR consensus guidelines: Managing Incidental Thyroid Nodules Detected on Imaging: White Paper of the ACR Incidental Thyroid Findings Committee. J Am Coll Radiol 2015;  12:143-150. Electronically Signed   By: Lovena Le M.D.   On: 01/31/2019 02:25   Ct Cervical Spine Wo Contrast  Result Date: 02/01/2019 CLINICAL DATA:  Head trauma, witnessed fall, dementia EXAM: CT HEAD WITHOUT CONTRAST CT CERVICAL SPINE WITHOUT CONTRAST TECHNIQUE: Multidetector CT imaging of the head and cervical spine was performed following the standard protocol without intravenous contrast.  Multiplanar CT image reconstructions of the cervical spine were also generated. COMPARISON:  01/31/2019 FINDINGS: CT HEAD FINDINGS Brain: No evidence of acute infarction, hemorrhage, hydrocephalus, extra-axial collection or mass lesion/mass effect. There is extensive periventricular and deep white matter hypodensity, global volume loss, and prominence of the lateral ventricles. Vascular: No hyperdense vessel or unexpected calcification. Skull: Normal. Negative for fracture or focal lesion. Sinuses/Orbits: No acute finding. Other: None. CT CERVICAL SPINE FINDINGS Alignment: Normal. Skull base and vertebrae: No acute fracture. No primary bone lesion or focal pathologic process. Soft tissues and spinal canal: No prevertebral fluid or swelling. No visible canal hematoma. Disc levels: Fusion of C5-C6. Otherwise mild multilevel disc space height loss. Upper chest: Emphysema. Other: Carotid and aortic atherosclerosis. IMPRESSION: 1. No acute intracranial pathology. Advanced small-vessel white matter disease and global volume loss with enlargement of the ventricles ex vacuo. 2.  No fracture or static subluxation of the cervical spine. Electronically Signed   By: Eddie Candle M.D.   On: 02/01/2019 15:32   Ct Cervical Spine Wo Contrast  Result Date: 01/31/2019 CLINICAL DATA:  Headache, posttraumatic, positive head strike EXAM: CT HEAD WITHOUT CONTRAST CT CERVICAL SPINE WITHOUT CONTRAST TECHNIQUE: Multidetector CT imaging of the head and cervical spine was performed following the standard protocol without intravenous contrast. Multiplanar CT image reconstructions of the cervical spine were also generated. COMPARISON:  CT 08/08/2018 FINDINGS: CT HEAD FINDINGS Brain: No evidence of acute infarction, hemorrhage, hydrocephalus, extra-axial collection or mass lesion/mass effect. Symmetric prominence of the ventricles, cisterns and sulci compatible with moderate parenchymal volume loss. Patchy and confluent areas of white matter  hypoattenuation are most compatible with chronic microvascular angiopathy. Senescent mineralization of the basal ganglia. Vascular: Atherosclerotic calcification of the carotid siphons. No hyperdense vessel or dural sinus. Skull: Small amount of left parieto-occipital scalp infiltration. No subjacent calvarial fracture. Sinuses/Orbits: Hypo pneumatization of the right maxillary sinus is only partially evaluated on this exam. Similar appearance to prior head CT. Remaining paranasal sinuses and mastoids are predominantly clear. Small amount of debris in the external auditory canals. Other: None. CT CERVICAL SPINE FINDINGS Alignment: Straightening of the normal cervical lordosis with slight focal reversal across the fused C5-6 levels. The non instrumented C5-6 fusion appears complete. Degenerative anterolisthesis of C4 on C5 is unchanged from comparison. No traumatic listhesis is identified. Atlantoaxial and craniocervical articulations are maintained. Skull base and vertebrae: No acute fracture. No primary bone lesion or focal pathologic process. Soft tissues and spinal canal: No pre or paravertebral fluid or swelling. No visible canal hematoma. Disc levels: Fusion of the C5-6 levels. Adjacent segment disease at C6-7 with cervical spondylitic endplate changes. Disc osteophyte complex at C4-5. No significant spinal canal stenosis. Moderate bilateral foraminal narrowing at C6-7, mild left and moderate right foraminal narrowing at C4-5. Upper chest: No acute abnormality in the upper chest or imaged lung apices. Biapical pleuroparenchymal scarring and emphysematous changes. Atherosclerotic calcification of the aortic arch and great vessels. Calcification of the common carotids and carotid bifurcations. Surgical clips present at the sternal notch. Stable subcentimeter thyroid nodule in the posterior left lobe. Other: None. IMPRESSION: 1. No acute intracranial abnormality.  2. Small amount of left parieto-occipital scalp  infiltration without subjacent calvarial fracture. 3. No acute cervical spine fracture. 4. 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 5. Aortic Atherosclerosis (ICD10-I70.0).  Carotid atherosclerosis. 6. Emphysema (ICD10-J43.9). 7. Subcentimeter nodule in the posterior left lobe thyroid. No further imaging required. This follows ACR consensus guidelines: Managing Incidental Thyroid Nodules Detected on Imaging: White Paper of the ACR Incidental Thyroid Findings Committee. J Am Coll Radiol 2015; 12:143-150. Electronically Signed   By: Kreg Shropshire M.D.   On: 01/31/2019 02:25   Dg Knee Complete 4 Views Right  Result Date: 02/01/2019 CLINICAL DATA:  Pain status post fall EXAM: RIGHT KNEE - COMPLETE 4+ VIEW COMPARISON:  None. 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 There is mild joint space narrowing. There is no displaced fracture. No dislocation. However, evaluation is limited by osteopenia. Advanced vascular calcifications are noted. IMPRESSION: Degenerative changes without acute osseous finding. Electronically Signed   By: Katherine Mantle M.D.   On: 02/01/2019 15:03   Dg Foot Complete Left  Result Date: 01/31/2019 CLINICAL DATA:  Fall, foot bruising at the base of the great toe. EXAM: LEFT FOOT - COMPLETE 3+ VIEW COMPARISON:  None. FINDINGS: The osseous structures appear diffusely demineralized which may limit detection of small or nondisplaced fractures. Small nondisplaced fracture involving the medial base of the first proximal phalanx. Mild adjacent soft tissue swelling. No other definite acute fracture or traumatic malalignment. Midfoot and hindfoot alignment is grossly preserved though incompletely assessed on nonweightbearing films. Small corticated os peroneum is noted. No subcutaneous gas or foreign body. Vascular calcium is noted in the soft tissues. Soft tissues are otherwise unremarkable. IMPRESSION: Small nondisplaced fracture  involving the medial base of the first proximal phalanx. Electronically Signed   By: Kreg Shropshire M.D.   On: 01/31/2019 02:33   Dg Femur Min 2 Views Right  Result Date: 02/01/2019 CLINICAL DATA:  Pain status post fall EXAM: RIGHT FEMUR 2 VIEWS COMPARISON:  None. FINDINGS: There is no evidence of fracture or other focal bone lesions. Soft tissues are unremarkable. Advanced vascular calcifications are noted. IMPRESSION: Negative. Electronically Signed   By: Katherine Mantle M.D.   On: 02/01/2019 15:04    Assessment/Plan Active Problems:   Recurrent falls   Recurrent falls: -Could be secondary to polypharmacy, orthostatic hypotension, worsening dementia and unstable gait secondary to fracture -Reviewed patient CT head and cervical spine-no acute findings noted -UA is negative for infection.  Ordered EKG, orthostatic vitals -Place patient under observation, on cardiac monitor -Ordered TSH, B12, vitamin D, syphilis RPR-pending -Consulted physical therapy.  Ordered swallow eval-if passed will start patient on diet. -Consulted case manager for skilled nursing facility placement  Right ankle fracture and left great toe phalanx fracture: -Has short leg splint on right leg and postoperative boot-left foot. -Nonweightbearing advised for 6 weeks. -Tylenol for pain control.  Dementia: Worsening as per daughter -We will continue home medication: Memantine and donepezil  Hyperlipidemia: Stable -We will check lipid panel and continue simvastatin  Depression: Stable we will continue escitalopram  Hypertension: Blood pressure is slightly elevated -Takes Coreg at home.  Will continue same. -Monitor blood pressure closely  Chronic lower back pain: -She takes Norco 7.5 every 4 hours as needed for severe pain at home.  Will continue same.  Chronic constipation: -Ordered MiraLAX.  DVT prophylaxis: Lovenox Code Status: Full code  family Communication: Daughter present at bedside.  Plan of care  discussed with daughter in length and she  verbalized understanding and agreed with it.  Daughter's name: Rimsha Trembley.  Contact number: [phone]  disposition Plan: SNF Consults called: None Admission status: Observation  Ollen Bowl MD [facility] Pager [phone]  If 7PM-7AM, please contact night-coverage [email or web address] Password [email or web address]  02/01/2019, 5:42 PM

## 2019-02-02 DIAGNOSIS — M2550 Pain in unspecified joint: Secondary | ICD-10-CM | POA: Diagnosis not present

## 2019-02-02 DIAGNOSIS — Z7401 Bed confinement status: Secondary | ICD-10-CM | POA: Diagnosis not present

## 2019-02-02 DIAGNOSIS — R5381 Other malaise: Secondary | ICD-10-CM | POA: Diagnosis not present

## 2019-02-02 DIAGNOSIS — R269 Unspecified abnormalities of gait and mobility: Secondary | ICD-10-CM | POA: Diagnosis present

## 2019-02-02 DIAGNOSIS — Z20828 Contact with and (suspected) exposure to other viral communicable diseases: Secondary | ICD-10-CM | POA: Diagnosis not present

## 2019-02-02 DIAGNOSIS — W19XXXA Unspecified fall, initial encounter: Secondary | ICD-10-CM | POA: Diagnosis not present

## 2019-02-02 DIAGNOSIS — R627 Adult failure to thrive: Secondary | ICD-10-CM | POA: Diagnosis not present

## 2019-02-02 DIAGNOSIS — G8929 Other chronic pain: Secondary | ICD-10-CM | POA: Diagnosis present

## 2019-02-02 DIAGNOSIS — W010XXA Fall on same level from slipping, tripping and stumbling without subsequent striking against object, initial encounter: Secondary | ICD-10-CM | POA: Diagnosis present

## 2019-02-02 DIAGNOSIS — S92402A Displaced unspecified fracture of left great toe, initial encounter for closed fracture: Secondary | ICD-10-CM | POA: Diagnosis present

## 2019-02-02 DIAGNOSIS — Z79899 Other long term (current) drug therapy: Secondary | ICD-10-CM | POA: Diagnosis not present

## 2019-02-02 DIAGNOSIS — R2689 Other abnormalities of gait and mobility: Secondary | ICD-10-CM | POA: Diagnosis not present

## 2019-02-02 DIAGNOSIS — F329 Major depressive disorder, single episode, unspecified: Secondary | ICD-10-CM | POA: Diagnosis not present

## 2019-02-02 DIAGNOSIS — M6281 Muscle weakness (generalized): Secondary | ICD-10-CM | POA: Diagnosis not present

## 2019-02-02 DIAGNOSIS — S82891D Other fracture of right lower leg, subsequent encounter for closed fracture with routine healing: Secondary | ICD-10-CM | POA: Diagnosis not present

## 2019-02-02 DIAGNOSIS — R300 Dysuria: Secondary | ICD-10-CM | POA: Diagnosis not present

## 2019-02-02 DIAGNOSIS — Z7982 Long term (current) use of aspirin: Secondary | ICD-10-CM | POA: Diagnosis not present

## 2019-02-02 DIAGNOSIS — G20 Parkinson's disease: Secondary | ICD-10-CM | POA: Diagnosis not present

## 2019-02-02 DIAGNOSIS — Z66 Do not resuscitate: Secondary | ICD-10-CM | POA: Diagnosis not present

## 2019-02-02 DIAGNOSIS — I2510 Atherosclerotic heart disease of native coronary artery without angina pectoris: Secondary | ICD-10-CM | POA: Diagnosis not present

## 2019-02-02 DIAGNOSIS — E785 Hyperlipidemia, unspecified: Secondary | ICD-10-CM | POA: Diagnosis not present

## 2019-02-02 DIAGNOSIS — M545 Low back pain: Secondary | ICD-10-CM | POA: Diagnosis present

## 2019-02-02 DIAGNOSIS — J410 Simple chronic bronchitis: Secondary | ICD-10-CM | POA: Diagnosis not present

## 2019-02-02 DIAGNOSIS — S92919A Unspecified fracture of unspecified toe(s), initial encounter for closed fracture: Secondary | ICD-10-CM | POA: Diagnosis not present

## 2019-02-02 DIAGNOSIS — S82891A Other fracture of right lower leg, initial encounter for closed fracture: Secondary | ICD-10-CM | POA: Diagnosis not present

## 2019-02-02 DIAGNOSIS — R296 Repeated falls: Secondary | ICD-10-CM | POA: Diagnosis not present

## 2019-02-02 DIAGNOSIS — F419 Anxiety disorder, unspecified: Secondary | ICD-10-CM | POA: Diagnosis present

## 2019-02-02 DIAGNOSIS — R1312 Dysphagia, oropharyngeal phase: Secondary | ICD-10-CM | POA: Diagnosis not present

## 2019-02-02 DIAGNOSIS — Z951 Presence of aortocoronary bypass graft: Secondary | ICD-10-CM | POA: Diagnosis not present

## 2019-02-02 DIAGNOSIS — S92492S Other fracture of left great toe, sequela: Secondary | ICD-10-CM | POA: Diagnosis not present

## 2019-02-02 DIAGNOSIS — I10 Essential (primary) hypertension: Secondary | ICD-10-CM | POA: Diagnosis not present

## 2019-02-02 DIAGNOSIS — S92101A Unspecified fracture of right talus, initial encounter for closed fracture: Secondary | ICD-10-CM | POA: Diagnosis not present

## 2019-02-02 DIAGNOSIS — Y92019 Unspecified place in single-family (private) house as the place of occurrence of the external cause: Secondary | ICD-10-CM | POA: Diagnosis not present

## 2019-02-02 DIAGNOSIS — R2681 Unsteadiness on feet: Secondary | ICD-10-CM | POA: Diagnosis not present

## 2019-02-02 DIAGNOSIS — R278 Other lack of coordination: Secondary | ICD-10-CM | POA: Diagnosis not present

## 2019-02-02 DIAGNOSIS — R41841 Cognitive communication deficit: Secondary | ICD-10-CM | POA: Diagnosis not present

## 2019-02-02 DIAGNOSIS — M25512 Pain in left shoulder: Secondary | ICD-10-CM | POA: Diagnosis present

## 2019-02-02 DIAGNOSIS — G253 Myoclonus: Secondary | ICD-10-CM | POA: Diagnosis not present

## 2019-02-02 DIAGNOSIS — Z87891 Personal history of nicotine dependence: Secondary | ICD-10-CM | POA: Diagnosis not present

## 2019-02-02 DIAGNOSIS — S0093XA Contusion of unspecified part of head, initial encounter: Secondary | ICD-10-CM | POA: Diagnosis present

## 2019-02-02 DIAGNOSIS — M25552 Pain in left hip: Secondary | ICD-10-CM | POA: Diagnosis present

## 2019-02-02 DIAGNOSIS — F0280 Dementia in other diseases classified elsewhere without behavioral disturbance: Secondary | ICD-10-CM | POA: Diagnosis not present

## 2019-02-02 DIAGNOSIS — S92492D Other fracture of left great toe, subsequent encounter for fracture with routine healing: Secondary | ICD-10-CM | POA: Diagnosis not present

## 2019-02-02 DIAGNOSIS — S92411A Displaced fracture of proximal phalanx of right great toe, initial encounter for closed fracture: Secondary | ICD-10-CM | POA: Diagnosis not present

## 2019-02-02 DIAGNOSIS — S82891S Other fracture of right lower leg, sequela: Secondary | ICD-10-CM | POA: Diagnosis not present

## 2019-02-02 DIAGNOSIS — S0990XA Unspecified injury of head, initial encounter: Secondary | ICD-10-CM | POA: Diagnosis present

## 2019-02-02 DIAGNOSIS — K5909 Other constipation: Secondary | ICD-10-CM | POA: Diagnosis not present

## 2019-02-02 LAB — CBC
HCT: 39.1 % (ref 36.0–46.0)
Hemoglobin: 12.7 g/dL (ref 12.0–15.0)
MCH: 30.8 pg (ref 26.0–34.0)
MCHC: 32.5 g/dL (ref 30.0–36.0)
MCV: 94.9 fL (ref 80.0–100.0)
Platelets: 208 10*3/uL (ref 150–400)
RBC: 4.12 MIL/uL (ref 3.87–5.11)
RDW: 13.6 % (ref 11.5–15.5)
WBC: 5.8 10*3/uL (ref 4.0–10.5)
nRBC: 0 % (ref 0.0–0.2)

## 2019-02-02 LAB — COMPREHENSIVE METABOLIC PANEL
ALT: 14 U/L (ref 0–44)
AST: 19 U/L (ref 15–41)
Albumin: 3.3 g/dL — ABNORMAL LOW (ref 3.5–5.0)
Alkaline Phosphatase: 57 U/L (ref 38–126)
Anion gap: 10 (ref 5–15)
BUN: 13 mg/dL (ref 8–23)
CO2: 23 mmol/L (ref 22–32)
Calcium: 8.6 mg/dL — ABNORMAL LOW (ref 8.9–10.3)
Chloride: 107 mmol/L (ref 98–111)
Creatinine, Ser: 0.6 mg/dL (ref 0.44–1.00)
GFR calc Af Amer: >60 mL/min (ref >60)
GFR calc non Af Amer: >60 mL/min (ref >60)
Glucose, Bld: 98 mg/dL (ref 70–99)
Potassium: 3.4 mmol/L — ABNORMAL LOW (ref 3.5–5.1)
Sodium: 140 mmol/L (ref 135–145)
Total Bilirubin: 0.8 mg/dL (ref 0.3–1.2)
Total Protein: 5.9 g/dL — ABNORMAL LOW (ref 6.5–8.1)

## 2019-02-02 LAB — RPR: RPR Ser Ql: NONREACTIVE

## 2019-02-02 MED ORDER — POTASSIUM CHLORIDE CRYS ER 20 MEQ PO TBCR
20.0000 meq | EXTENDED_RELEASE_TABLET | Freq: Once | ORAL | Status: AC
  Administered 2019-02-02: 20 meq via ORAL
  Filled 2019-02-02: qty 1

## 2019-02-02 MED ORDER — ORAL CARE MOUTH RINSE
15.0000 mL | Freq: Two times a day (BID) | OROMUCOSAL | Status: DC
  Administered 2019-02-02 – 2019-02-07 (×12): 15 mL via OROMUCOSAL

## 2019-02-02 MED ORDER — SENNOSIDES-DOCUSATE SODIUM 8.6-50 MG PO TABS
1.0000 | ORAL_TABLET | Freq: Two times a day (BID) | ORAL | Status: DC
  Administered 2019-02-02 – 2019-02-08 (×12): 1 via ORAL
  Filled 2019-02-02 (×12): qty 1

## 2019-02-02 NOTE — Plan of Care (Signed)
  Problem: Education: Goal: Knowledge of General Education information will improve Description: Including pain rating scale, medication(s)/side effects and non-pharmacologic comfort measures Outcome: Not Progressing   Problem: Activity: Goal: Risk for activity intolerance will decrease Outcome: Not Progressing   Problem: Nutrition: Goal: Adequate nutrition will be maintained Outcome: Not Progressing   Problem: Safety: Goal: Ability to remain free from injury will improve Outcome: Not Progressing   

## 2019-02-02 NOTE — Progress Notes (Signed)
Patient had trouble urinating, bladder scan showed 999. In & out completed, but was only able to get 572ml of urine.

## 2019-02-02 NOTE — Progress Notes (Addendum)
PROGRESS NOTE    Jasmine Carrillo  HUT:[id number] DOB: 12/15/1940 DOA: 02/01/2019 PCP: Laurann Montana, MD  Brief Narrative: 78 year old female with history of CAD, CABG in 2008, dementia-possible Parkinson's, dyslipidemia, chronic constipation, depression was brought to the ER by her daughter due to recurrent falls, -She was seen in the ER originally on 9/3, after 3 falls, diagnosed with right ankle, talus fracture short-leg splint placed, postop boot and then went home, recommended nonweightbearing and orthopedics follow-up after she went back home she fell again and subsequently brought back to the ER and was admitted last night   Assessment & Plan:   Recurrent falls, Gait disorder -Suspect this may be secondary to advanced age, dementia and movement disorder -Gait disorder has been an ongoing issue, seen by Dr. Anne Hahn with [facility] neurology for this -Doubt polypharmacy is contributing, no new medications , unfortunately unable to check orthostatics given nonweightbearing status -PT OT evaluation -Social work and case management consult  Right ankle fracture and left great toe phalanx fracture: -Has short leg splint on right leg and postoperative boot-left foot from ER trip on 9/2 -Nonweightbearing advised for 6 weeks. -Tylenol for pain control. -PT eval -FU with Orthopedics  Chronic back pain -continue hydrocodone, per daughter daughter patient has been taking this for over 10 years, has severe spinal issues in her lower back and cries without pain medications -No changes recently  Dementia:  -continue Memantine and donepezil  Hyperlipidemia - continue simvastatin  Depression: -continue escitalopram  Hypertension -: Blood pressure is slightly elevated -Continue carvedilol  Chronic constipation: -Ordered MiraLAX.  DVT prophylaxis: Lovenox Code Status:  DNR, discussed with daughter family Communication:  No family at bedside, called and discussed with daughter Ellwood Sayers disposition Plan:  To be determined, PT OT, social work consulted   Procedures:   Antimicrobials:    Subjective: -Pleasantly confused, ate breakfast  Objective: Vitals:   02/01/19 1945 02/01/19 2202 02/01/19 2358 02/02/19 0534  BP: (!) 162/84 (!) 151/92 (!) 152/91 (!) 155/75  Pulse:  79 79 66  Resp:  15  12  Temp:  98.7 F (37.1 C)  98.4 F (36.9 C)  TempSrc:  Oral  Oral  SpO2:  97%  94%    Intake/Output Summary (Last 24 hours) at 02/02/2019 1113 Last data filed at 02/02/2019 0950 Gross per 24 hour  Intake -  Output 898 ml  Net -898 ml   There were no vitals filed for this visit.  Examination:  General exam: Awake alert, oriented to self only, pleasantly confused Respiratory system: Clear to auscultation Cardiovascular system: S1 & S2 heard, RRR.  Gastrointestinal system: Abdomen is nondistended, soft and nontender.Normal bowel sounds heard. Central nervous system: Moves all extremities, confused Extremities: No edema, right ankle in a splint Skin: As above Psychiatry: Unable to assess    Data Reviewed:   CBC: Recent Labs  Lab 02/01/19 1533 02/01/19 1733 02/02/19 0432  WBC 5.3 5.9 5.8  NEUTROABS 3.6  --   --   HGB 13.3 12.7 12.7  HCT 41.6 40.8 39.1  MCV 95.9 97.6 94.9  PLT 218 222 208   Basic Metabolic Panel: Recent Labs  Lab 02/01/19 1533 02/01/19 1733 02/02/19 0432  NA 141  --  140  K 4.0  --  3.4*  CL 108  --  107  CO2 25  --  23  GLUCOSE 91  --  98  BUN 15  --  13  CREATININE 0.79 0.68 0.60  CALCIUM 8.8*  --  8.6*  MG  --  2.2  --   PHOS  --  3.3  --    GFR: CrCl cannot be calculated (Unknown ideal weight.). Liver Function Tests: Recent Labs  Lab 02/02/19 0432  AST 19  ALT 14  ALKPHOS 57  BILITOT 0.8  PROT 5.9*  ALBUMIN 3.3*   No results for input(s): LIPASE, AMYLASE in the last 168 hours. No results for input(s): AMMONIA in the last 168 hours. Coagulation Profile: No results for input(s): INR, PROTIME in the  last 168 hours. Cardiac Enzymes: No results for input(s): CKTOTAL, CKMB, CKMBINDEX, TROPONINI in the last 168 hours. BNP (last 3 results) No results for input(s): PROBNP in the last 8760 hours. HbA1C: No results for input(s): HGBA1C in the last 72 hours. CBG: No results for input(s): GLUCAP in the last 168 hours. Lipid Profile: Recent Labs    02/01/19 2233  CHOL 162  HDL 72  LDLCALC 80  TRIG 51  CHOLHDL 2.3   Thyroid Function Tests: Recent Labs    02/01/19 1733  TSH 0.858   Anemia Panel: Recent Labs    02/01/19 1733  VITAMINB12 415   Urine analysis:    Component Value Date/Time   COLORURINE YELLOW 02/01/2019 [place] 02/01/2019 1615   LABSPEC 1.016 02/01/2019 1615   PHURINE 5.0 02/01/2019 1615   GLUCOSEU NEGATIVE 02/01/2019 1615   HGBUR NEGATIVE 02/01/2019 1615   BILIRUBINUR NEGATIVE 02/01/2019 1615   KETONESUR NEGATIVE 02/01/2019 1615   PROTEINUR NEGATIVE 02/01/2019 1615   NITRITE NEGATIVE 02/01/2019 1615   LEUKOCYTESUR NEGATIVE 02/01/2019 1615   Sepsis Labs: @LABRCNTIP (procalcitonin:4,lacticidven:4)  ) Recent Results (from the past 240 hour(s))  SARS Coronavirus 2 [facility] order, Performed in [facility] hospital lab) Nasopharyngeal Nasopharyngeal Swab     Status: None   Collection Time: 02/01/19  4:36 PM   Specimen: Nasopharyngeal Swab  Result Value Ref Range Status   SARS Coronavirus 2 NEGATIVE NEGATIVE Final    Comment: (NOTE) If result is NEGATIVE SARS-CoV-2 target nucleic acids are NOT DETECTED. The SARS-CoV-2 RNA is generally detectable in upper and lower  respiratory specimens during the acute phase of infection. The lowest  concentration of SARS-CoV-2 viral copies this assay can detect is 250  copies / mL. A negative result does not preclude SARS-CoV-2 infection  and should not be used as the sole basis for treatment or other  patient management decisions.  A negative result may occur with  improper specimen collection /  handling, submission of specimen other  than nasopharyngeal swab, presence of viral mutation(s) within the  areas targeted by this assay, and inadequate number of viral copies  (<250 copies / mL). A negative result must be combined with clinical  observations, patient history, and epidemiological information. If result is POSITIVE SARS-CoV-2 target nucleic acids are DETECTED. The SARS-CoV-2 RNA is generally detectable in upper and lower  respiratory specimens dur ing the acute phase of infection.  Positive  results are indicative of active infection with SARS-CoV-2.  Clinical  correlation with patient history and other diagnostic information is  necessary to determine patient infection status.  Positive results do  not rule out bacterial infection or co-infection with other viruses. If result is PRESUMPTIVE POSTIVE SARS-CoV-2 nucleic acids MAY BE PRESENT.   A presumptive positive result was obtained on the submitted specimen  and confirmed on repeat testing.  While 2019 novel coronavirus  (SARS-CoV-2) nucleic acids may be present in the submitted sample  additional confirmatory testing may be necessary for  epidemiological  and / or clinical management purposes  to differentiate between  SARS-CoV-2 and other Sarbecovirus currently known to infect humans.  If clinically indicated additional testing with an alternate test  methodology [phone]([id number]) is advised. The SARS-CoV-2 RNA is generally  detectable in upper and lower respiratory sp ecimens during the acute  phase of infection. The expected result is Negative. Fact Sheet for Patients:  BoilerBrush.com.cyhttps://www.fda.gov/media/136312/download Fact Sheet for Healthcare Providers: https://pope.com/https://www.fda.gov/media/136313/download This test is not yet approved or cleared by the [place] FDA and has been authorized for detection and/or diagnosis of SARS-CoV-2 by FDA under an Emergency Use Authorization (EUA).  This EUA will remain in effect (meaning this  test can be used) for the duration of the COVID-19 declaration under Section 564(b)(1) of the Act, 21 U.S.C. section 360bbb-3(b)(1), unless the authorization is terminated or revoked sooner. Performed at [facility], [address] [place], [place] [address]          Radiology Studies: Dg Pelvis 1-2 Views  Result Date: 02/01/2019 CLINICAL DATA:  Pain status post fall EXAM: PELVIS - 1-2 VIEW COMPARISON:  None. FINDINGS: There is no evidence of pelvic fracture or diastasis. No pelvic bone lesions are seen. Advanced vascular calcifications are noted. IMPRESSION: Negative. Electronically Signed   By: Katherine Mantlehristopher  Green M.D.   On: 02/01/2019 15:05   Dg Tibia/fibula Right  Result Date: 02/01/2019 CLINICAL DATA:  Patient had a witnessed fall by daughter and Right knee pain, traveling down the right leg. [facility] fracture and left big toe fracture. History of dementia EXAM: RIGHT TIBIA AND FIBULA - 2 VIEW COMPARISON:  RIGHT ankle 01/31/2019 FINDINGS: LOWER leg is imaged through splinting material. There is no acute fracture or subluxation of the tibia or fibula. No posterior talar process fracture is not well seen. Degenerative changes are seen at the knee. IMPRESSION: No evidence for acute abnormality. Electronically Signed   By: Norva PavlovElizabeth  Brown M.D.   On: 02/01/2019 15:04   Ct Head Wo Contrast  Result Date: 02/01/2019 CLINICAL DATA:  Head trauma, witnessed fall, dementia EXAM: CT HEAD WITHOUT CONTRAST CT CERVICAL SPINE WITHOUT CONTRAST TECHNIQUE: Multidetector CT imaging of the head and cervical spine was performed following the standard protocol without intravenous contrast. Multiplanar CT image reconstructions of the cervical spine were also generated. COMPARISON:  01/31/2019 FINDINGS: CT HEAD FINDINGS Brain: No evidence of acute infarction, hemorrhage, hydrocephalus, extra-axial collection or mass lesion/mass effect. There is extensive periventricular  and deep white matter hypodensity, global volume loss, and prominence of the lateral ventricles. Vascular: No hyperdense vessel or unexpected calcification. Skull: Normal. Negative for fracture or focal lesion. Sinuses/Orbits: No acute finding. Other: None. CT CERVICAL SPINE FINDINGS Alignment: Normal. Skull base and vertebrae: No acute fracture. No primary bone lesion or focal pathologic process. Soft tissues and spinal canal: No prevertebral fluid or swelling. No visible canal hematoma. Disc levels: Fusion of C5-C6. Otherwise mild multilevel disc space height loss. Upper chest: Emphysema. Other: Carotid and aortic atherosclerosis. IMPRESSION: 1. No acute intracranial pathology. Advanced small-vessel white matter disease and global volume loss with enlargement of the ventricles ex vacuo. 2.  No fracture or static subluxation of the cervical spine. Electronically Signed   By: Lauralyn PrimesAlex  Bibbey M.D.   On: 02/01/2019 15:32   Ct Cervical Spine Wo Contrast  Result Date: 02/01/2019 CLINICAL DATA:  Head trauma, witnessed fall, dementia EXAM: CT HEAD WITHOUT CONTRAST CT CERVICAL SPINE WITHOUT CONTRAST TECHNIQUE: Multidetector CT imaging of the head  and cervical spine was performed following the standard protocol without intravenous contrast. Multiplanar CT image reconstructions of the cervical spine were also generated. COMPARISON:  01/31/2019 FINDINGS: CT HEAD FINDINGS Brain: No evidence of acute infarction, hemorrhage, hydrocephalus, extra-axial collection or mass lesion/mass effect. There is extensive periventricular and deep white matter hypodensity, global volume loss, and prominence of the lateral ventricles. Vascular: No hyperdense vessel or unexpected calcification. Skull: Normal. Negative for fracture or focal lesion. Sinuses/Orbits: No acute finding. Other: None. CT CERVICAL SPINE FINDINGS Alignment: Normal. Skull base and vertebrae: No acute fracture. No primary bone lesion or focal pathologic process. Soft  tissues and spinal canal: No prevertebral fluid or swelling. No visible canal hematoma. Disc levels: Fusion of C5-C6. Otherwise mild multilevel disc space height loss. Upper chest: Emphysema. Other: Carotid and aortic atherosclerosis. IMPRESSION: 1. No acute intracranial pathology. Advanced small-vessel white matter disease and global volume loss with enlargement of the ventricles ex vacuo. 2.  No fracture or static subluxation of the cervical spine. Electronically Signed   By: Lauralyn PrimesAlex  Bibbey M.D.   On: 02/01/2019 15:32   Dg Knee Complete 4 Views Right  Result Date: 02/01/2019 CLINICAL DATA:  Pain status post fall EXAM: RIGHT KNEE - COMPLETE 4+ VIEW COMPARISON:  None. 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 There is mild joint space narrowing. There is no displaced fracture. No dislocation. However, evaluation is limited by osteopenia. Advanced vascular calcifications are noted. IMPRESSION: Degenerative changes without acute osseous finding. Electronically Signed   By: Katherine Mantlehristopher  Green M.D.   On: 02/01/2019 15:03   Dg Femur Min 2 Views Right  Result Date: 02/01/2019 CLINICAL DATA:  Pain status post fall EXAM: RIGHT FEMUR 2 VIEWS COMPARISON:  None. FINDINGS: There is no evidence of fracture or other focal bone lesions. Soft tissues are unremarkable. Advanced vascular calcifications are noted. IMPRESSION: Negative. Electronically Signed   By: Katherine Mantlehristopher  Green M.D.   On: 02/01/2019 15:04        Scheduled Meds: . aspirin  81 mg Oral Daily  . carvedilol  6.25 mg Oral BID  . donepezil  10 mg Oral QPM  . enoxaparin (LOVENOX) injection  40 mg Subcutaneous Q24H  . escitalopram  20 mg Oral Daily  . memantine  10 mg Oral BID  . polyethylene glycol  17 g Oral Daily  . simvastatin  40 mg Oral QPM   Continuous Infusions:   LOS: 0 days    Time spent: 35min    Zannie CovePreetha Hisayo Delossantos, MD [facility] Page via [email or web address], password [email or web address] After 7PM please contact night-coverage  02/02/2019, 11:13 AM

## 2019-02-02 NOTE — Evaluation (Signed)
Clinical/Bedside Swallow Evaluation Patient Details  Name: Jasmine Carrillo MRN: [id number] Date of Birth: 08/10/1940  Today's Date: 02/02/2019 Time: SLP Start Time (ACUTE ONLY): 13080925 SLP Stop Time (ACUTE ONLY): 0957 SLP Time Calculation (min) (ACUTE ONLY): 32 min  Past Medical History:  Past Medical History:  Diagnosis Date  . Anxiety   . Depression    Past Surgical History:  Past Surgical History:  Procedure Laterality Date  . CORONARY ARTERY BYPASS GRAFT    . NECK SURGERY     Neck bone replacement    HPI:  Ms Jasmine Carrillo, 78y/f, presented to ED due to concern of recurrent falls. PMH of coronary artery disease status post CABG in 2008, hypertension, dementia, hyperlipidemia, chronic constipation, depression. Pt was recently diagnosed with a Tyler fracture in right ankle and phalanx fracture on left great toe.  Short leg splint to right leg and postop [facility], advised nonweightbearing for 4 to 6 weeks, she discharged home in a stable condition. On morning of admission pt fell again. Pt was recently diagnosed with Parkinson's Disease. No prior history of Dysphagia.    Assessment / Plan / Recommendation Clinical Impression  Patient seen for clinical swallow evalauation. She has no prior history of dysphagia per chart. She does have a history of dementia (worsening per daughter) and a recent diagnosis of Parkinson's Disease. Oral motor/cranial nerve exam was WNL. Presentation of po trials (ice, water, puree, cracker) at various volumes were all tolerated with no s/sx of aspiration. Pt's vocal quality was clear throughout exam. Recommend regular diet, thin liquid diet with staff assistance to ensure standard aspiraiton precautions. Medication to be given whole with water or puree. ST services are no needed at this time.  SLP Visit Diagnosis: Dysphagia, unspecified (R13.10)    Aspiration Risk  Mild aspiration risk    Diet Recommendation Regular;Thin liquid    Liquid Administration via: Cup;Straw Medication Administration: Whole meds with liquid Supervision: Intermittent supervision to cue for compensatory strategies Compensations: Minimize environmental distractions;Slow rate;Small sips/bites Postural Changes: Seated upright at 90 degrees    Other  Recommendations Oral Care Recommendations: Oral care BID   Follow up Recommendations Skilled Nursing facility      Frequency and Duration   no follow up         Prognosis Prognosis for Safe Diet Advancement: Good      Swallow Study   General Date of Onset: 02/01/19 HPI: Ms Jasmine Carrillo, 78y/f, presented to ED due to concern of recurrent falls. PMH of coronary artery disease status post CABG in 2008, hypertension, dementia, hyperlipidemia, chronic constipation, depression. Pt was recently diagnosed with a Tyler fracture in right ankle and phalanx fracture on left great toe.  Short leg splint to right leg and [facility], advised nonweightbearing for 4 to 6 weeks, she discharged home in a stable condition. On morning of admission pt fell again. Pt was recently diagnosed with Parkinson's Disease. No prior history of Dysphagia.  Type of Study: Bedside Swallow Evaluation Previous Swallow Assessment: none Diet Prior to this Study: NPO Temperature Spikes Noted: No Respiratory Status: Room air History of Recent Intubation: No Behavior/Cognition: Alert;Cooperative;Confused Oral Cavity Assessment: Within Functional Limits Oral Care Completed by SLP: Yes Oral Cavity - Dentition: Adequate natural dentition Vision: Functional for self-feeding Self-Feeding Abilities: Able to feed self Patient Positioning: Upright in bed Baseline Vocal Quality: Normal Volitional Cough: Strong Volitional Swallow: Able to elicit    Oral/Motor/Sensory Function Overall  Oral Motor/Sensory Function: Within functional limits   Ice Chips Ice chips: Within functional  limits Presentation: Spoon   Thin Liquid Thin Liquid: Within functional limits Presentation: Cup;Straw    Nectar Thick Nectar Thick Liquid: Not tested   Honey Thick Honey Thick Liquid: Not tested   Puree Puree: Within functional limits Presentation: Spoon   Solid     Solid: Within functional limits Presentation: [place], MA, CCC-SLP 02/02/2019 10:02 AM

## 2019-02-02 NOTE — Progress Notes (Signed)
Spoke with MD regarding pt's last BP 161/94. Advised to keep monitoring pt. Pt denies chest pain, HA or blurry vision. Pt's daughter did just leave and pt seems anxious. Will continue to monitor pt.

## 2019-02-03 ENCOUNTER — Encounter (HOSPITAL_COMMUNITY): Payer: Self-pay | Admitting: *Deleted

## 2019-02-03 DIAGNOSIS — I10 Essential (primary) hypertension: Secondary | ICD-10-CM

## 2019-02-03 DIAGNOSIS — E785 Hyperlipidemia, unspecified: Secondary | ICD-10-CM

## 2019-02-03 DIAGNOSIS — G253 Myoclonus: Secondary | ICD-10-CM

## 2019-02-03 DIAGNOSIS — R300 Dysuria: Secondary | ICD-10-CM

## 2019-02-03 LAB — BASIC METABOLIC PANEL
Anion gap: 14 (ref 5–15)
BUN: 15 mg/dL (ref 8–23)
CO2: 20 mmol/L — ABNORMAL LOW (ref 22–32)
Calcium: 9 mg/dL (ref 8.9–10.3)
Chloride: 104 mmol/L (ref 98–111)
Creatinine, Ser: 0.64 mg/dL (ref 0.44–1.00)
GFR calc Af Amer: >60 mL/min (ref >60)
GFR calc non Af Amer: >60 mL/min (ref >60)
Glucose, Bld: 109 mg/dL — ABNORMAL HIGH (ref 70–99)
Potassium: 3.7 mmol/L (ref 3.5–5.1)
Sodium: 138 mmol/L (ref 135–145)

## 2019-02-03 LAB — CBC
HCT: 42.6 % (ref 36.0–46.0)
Hemoglobin: 13.8 g/dL (ref 12.0–15.0)
MCH: 30.5 pg (ref 26.0–34.0)
MCHC: 32.4 g/dL (ref 30.0–36.0)
MCV: 94.2 fL (ref 80.0–100.0)
Platelets: 254 10*3/uL (ref 150–400)
RBC: 4.52 MIL/uL (ref 3.87–5.11)
RDW: 13.5 % (ref 11.5–15.5)
WBC: 6.2 10*3/uL (ref 4.0–10.5)
nRBC: 0 % (ref 0.0–0.2)

## 2019-02-03 LAB — AMMONIA: Ammonia: 21 umol/L (ref 9–35)

## 2019-02-03 MED ORDER — SODIUM CHLORIDE 0.9 % IV SOLN
INTRAVENOUS | Status: AC
  Administered 2019-02-03 – 2019-02-04 (×2): via INTRAVENOUS

## 2019-02-03 MED ORDER — DIVALPROEX SODIUM 250 MG PO DR TAB
250.0000 mg | DELAYED_RELEASE_TABLET | Freq: Every day | ORAL | Status: DC
  Administered 2019-02-03 – 2019-02-08 (×6): 250 mg via ORAL
  Filled 2019-02-03 (×6): qty 1

## 2019-02-03 NOTE — Evaluation (Signed)
Physical Therapy Evaluation Patient Details Name: Jasmine Carrillo MRN: [id number] DOB: 27-Apr-1941 Today's Date: 02/03/2019   History of Present Illness  Ms Jasmine Carrillo, 78y/f, presented to ED due to concern of recurrent falls. PMH of coronary artery disease status post CABG in 2008, hypertension, dementia, hyperlipidemia, chronic constipation, depression. Pt was recently diagnosed with a talus fracture in right ankle (NWB) and phalanx fracture on left great toe.  Short leg splint to right leg and postop boot applied to left leg at [facility] long, advised nonweightbearing for 4 to 6 weeks, she discharged home in a stable condition. On morning of admission pt fell again. Pt was recently diagnosed with Parkinson's Disease.  Clinical Impression   Pt admitted with above diagnosis. Unclear picture of prior level of function and home setup, but it is documented frequent falls at home, so difficulty managing; Presents to PT with decr functional mobility, decr activity tolerance, decr knowledge of precautions and weight bearing status;  Pt currently with functional limitations due to the deficits listed below (see PT Problem List). Pt will benefit from skilled PT to increase their independence and safety with mobility to allow discharge to the venue listed below.    At her current functional level, Ms. Jasmine Carrillo is very appropriate for SNF level of care, especially while she has weight bearing restrictions.     Follow Up Recommendations SNF;Supervision/Assistance - 24 hour;Other (comment)(We can keep in mind that she may qualify for he [facility] as well, if daughter feels she will do better in her familiar environment at home.)    Equipment Recommendations  Other (comment)(to be determined)    Recommendations for Other Services       Precautions / Restrictions Precautions Precautions: Fall Required Braces or Orthoses: Splint/Cast;Other Brace Splint/Cast: R short leg cast Other Brace: Sent  home from ED earlier the week of admission in L boot for great toe phalanx fx(boot was not in the room on eval; called Ortho Tech) Restrictions RLE Weight Bearing: Non weight bearing      Mobility  Bed Mobility Overal bed mobility: Needs Assistance Bed Mobility: Supine to Sit;Sit to Supine     Supine to sit: Max assist Sit to supine: Total assist   General bed mobility comments: Max to toal assist with all bed mobiltiy  Transfers Overall transfer level: Needs assistance   Transfers: Lateral/Scoot Transfers          Lateral/Scoot Transfers: Max assist;+2 safety/equipment General transfer comment: Simulated lateral scoot transfer, scooting towards HOB with bed pad; REquired assist to keep L foot off the floor  Ambulation/Gait                Stairs            Wheelchair Mobility    Modified Rankin (Stroke Patients Only)       Balance Overall balance assessment: Needs assistance           Standing balance-Leahy Scale: Poor Standing balance comment: min to mod assist to keep balance in sitting; uncomfortable                             Pertinent Vitals/Pain Pain Assessment: Faces Faces Pain Scale: Hurts little more Pain Location: Grimace with movement -- could be related to effort, or also noted bladder distension Pain Descriptors / Indicators: Grimacing Pain Intervention(s): Repositioned    Home Living Family/patient expects to be discharged to:: Other (Comment)  Additional Comments: Frequent falls and pt unable to give information re: home assist and setup, though noting recent difficulty managing at home    Prior Function           Comments: Details of PLOF are unknown, but noted recent difficulty managing at home with frequent falls     Hand Dominance        Extremity/Trunk Assessment   Upper Extremity Assessment Upper Extremity Assessment: Generalized weakness    Lower Extremity  Assessment Lower Extremity Assessment: RLE deficits/detail;LLE deficits/detail RLE Deficits / Details: Short leg cast on, ankle immobilized; able to extend kne against gravity; Overall difficult to assess due to decr cognition LLE Deficits / Details: No boot in the room; hip, knee, ankle ROM grossly WFL; difficulty testing strength due to decr cognition       Communication   Communication: Receptive difficulties;Expressive difficulties  Cognition Arousal/Alertness: Awake/alert Behavior During Therapy: Restless Overall Cognitive Status: No family/caregiver present to determine baseline cognitive functioning Area of Impairment: Orientation;Attention;Memory;Following commands;Awareness;Problem solving                 Orientation Level: Disoriented to;Place;Time;Situation   Memory: Decreased short-term memory(with noted history of dementia) Following Commands: Follows one step commands inconsistently;Follows one step commands with increased time       General Comments: increased time to answer questions; softspoken; at times, difficult to understand      General Comments General comments (skin integrity, edema, etc.): Noted discomfort with pt sitting EOB; Opted to not transfer OOB at this time as her boot was not in the room, and it is for the foot that can bear weight; scooted up in bed, and repositioned; noted palpable baldder distension in supine; RN notified and Nurse Tech going to get bladder scanner    Exercises     Assessment/Plan    PT Assessment Patient needs continued PT services  PT Problem List Decreased strength;Decreased range of motion;Decreased activity tolerance;Decreased balance;Decreased mobility;Decreased coordination;Decreased cognition;Decreased knowledge of use of DME;Decreased safety awareness;Decreased knowledge of precautions;Pain       PT Treatment Interventions DME instruction;Functional mobility training;Therapeutic activities;Therapeutic  exercise;Gait training;Balance training;Neuromuscular re-education;Cognitive remediation;Patient/family education;Wheelchair mobility training    PT Goals (Current goals can be found in the Care Plan section)  Acute Rehab PT Goals Patient Stated Goal: not stated PT Goal Formulation: Patient unable to participate in goal setting Time For Goal Achievement: 02/17/19 Potential to Achieve Goals: Fair    Frequency Min 2X/week   Barriers to discharge        Co-evaluation               AM-PAC PT "6 Clicks" Mobility  Outcome Measure Help needed turning from your back to your side while in a flat bed without using bedrails?: A Little Help needed moving from lying on your back to sitting on the side of a flat bed without using bedrails?: A Lot Help needed moving to and from a bed to a chair (including a wheelchair)?: Total Help needed standing up from a chair using your arms (e.g., wheelchair or bedside chair)?: Total Help needed to walk in hospital room?: Total Help needed climbing 3-5 steps with a railing? : Total 6 Click Score: 9    End of Session   Activity Tolerance: Patient limited by pain Patient left: in bed;with call bell/phone within reach;with bed alarm set;Other (comment)(Awaiting bladder scan) Nurse Communication: Mobility status;Other (comment)([place] bladder distention) PT Visit Diagnosis: Other abnormalities of gait and mobility (R26.89);Repeated falls (R29.6);Muscle weakness (generalized) (  M62.81);History of falling (Z91.81)    Time: 1015-1030 PT Time Calculation (min) (ACUTE ONLY): 15 min   Charges:   PT Evaluation $PT Eval Moderate Complexity: 1 Mod          Van ClinesHolly Sharron Simpson, [place]PT  Acute Rehabilitation Services Pager [phone] Office [phone]   Levi AlandHolly H Mykale Gandolfo 02/03/2019, 11:10 AM

## 2019-02-03 NOTE — Evaluation (Signed)
Occupational Therapy Evaluation Patient Details Name: Jasmine Carrillo MRN: [id number] DOB: [birth year] Today's Date: 02/03/2019    History of Present Illness Ms Jasmine Carrillo, 78y/f, presented to ED due to concern of recurrent falls. PMH of coronary artery disease status post CABG in 2008, hypertension, dementia, hyperlipidemia, chronic constipation, depression. Pt was recently diagnosed with a talus fracture in right ankle (NWB) and phalanx fracture on left great toe.  Short leg splint to right leg and postop boot applied to left leg at [facility] long, advised nonweightbearing for 4 to 6 weeks, she discharged home in a stable condition. On morning of admission pt fell again. Pt was recently diagnosed with Parkinson's Disease.   Clinical Impression   PTA patient lived with her daughter who assisted with ADLs, mobility as needed (daughter reports increased assist required in the last few weeks).  She was admitted for above and limited by problem list below, including impaired cognition, generalized weakness, impaired balance, decreased activity tolerance, and R LE NWB.  She currently requires max-total assist for bed mobility, mod assist for grooming, max -total assist for UB/LB ADls.  She is able to follow 1 step commands inconsistently given increased time, is oriented to self and place (when given options), but has poor awareness of deficits, safety.  She will benefit from continued OT services while admitted and after dc at SNF level rehab in order to decrease burden of care with ADLs and transfers.  Will follow acutely.     Follow Up Recommendations  SNF;Supervision/Assistance - 24 hour    Equipment Recommendations  3 in 1 bedside commode    Recommendations for Other Services       Precautions / Restrictions Precautions Precautions: Fall Required Braces or Orthoses: Splint/Cast;Other Brace Splint/Cast: R short leg cast Other Brace: CAM walker to L LE  Restrictions Weight Bearing  Restrictions: Yes RLE Weight Bearing: Non weight bearing      Mobility Bed Mobility Overal bed mobility: Needs Assistance Bed Mobility: Supine to Sit;Sit to Supine     Supine to sit: Max assist Sit to supine: Total assist   General bed mobility comments: Max to toal assist with all bed mobiltiy  Transfers                 General transfer comment: deferred    Balance Overall balance assessment: Needs assistance Sitting-balance support: Bilateral upper extremity supported;Feet unsupported Sitting balance-Leahy Scale: Poor Sitting balance - Comments: requires at least min to mod assist for sitting balance statically                                   ADL either performed or assessed with clinical judgement   ADL Overall ADL's : Needs assistance/impaired     Grooming: Moderate assistance;Wash/dry face;Bed level Grooming Details (indicate cue type and reason): able to initate but requires support for throughoutness  Upper Body Bathing: Maximal assistance;Sitting   Lower Body Bathing: Total assistance;Bed level;+2 for physical assistance   Upper Body Dressing : Total assistance;Bed level   Lower Body Dressing: Total assistance;Bed level     Toilet Transfer Details (indicate cue type and reason): deferred         Functional mobility during ADLs: Maximal assistance;Total assistance General ADL Comments: pt limted by weakness, precautions, impaired balance, and cognition     Vision         Perception     Praxis  Pertinent Vitals/Pain Pain Assessment: Faces Faces Pain Scale: Hurts a little bit Pain Location: grimacing with movement  Pain Descriptors / Indicators: Grimacing Pain Intervention(s): Repositioned;Monitored during session     Hand Dominance     Extremity/Trunk Assessment Upper Extremity Assessment Upper Extremity Assessment: Generalized weakness;RUE deficits/detail RUE Deficits / Details: tremors noted  RUE  Coordination: decreased fine motor;decreased gross motor   Lower Extremity Assessment Lower Extremity Assessment: Defer to PT evaluation       Communication Communication Communication: Receptive difficulties;Expressive difficulties   Cognition Arousal/Alertness: Awake/alert Behavior During Therapy: Restless Overall Cognitive Status: History of cognitive impairments - at baseline Area of Impairment: Orientation;Attention;Memory;Following commands;Safety/judgement;Awareness;Problem solving                 Orientation Level: Disoriented to;Time;Situation Current Attention Level: Focused Memory: Decreased short-term memory;Decreased recall of precautions Following Commands: Follows one step commands inconsistently;Follows one step commands with increased time Safety/Judgement: Decreased awareness of safety;Decreased awareness of deficits Awareness: Intellectual Problem Solving: Slow processing;Decreased initiation;Difficulty sequencing;Requires verbal cues;Requires tactile cues General Comments: difficult to understand at times    General Comments  daughter present and supportive, reports caring for her mother since 2010 and she is no longer able to assist her in the home at this level; daughter brough post op shoe but CAM walker was already on L LE--informed RN    Exercises     Shoulder Instructions      Home Living Family/patient expects to be discharged to:: Private residence Living Arrangements: Children(daughter ) Available Help at Discharge: Family Type of Home: House Home Access: Stairs to enter Secretary/administratorntrance Stairs-Number of Steps: 2   Home Layout: One level     Bathroom Shower/Tub: Chief Strategy OfficerTub/shower unit   Bathroom Toilet: Standard         Additional Comments: daughter present to provide home setup      Prior Functioning/Environment Level of Independence: Needs assistance  Gait / Transfers Assistance Needed: some assist with mobility  ADL's / Homemaking  Assistance Needed: assist with ADLs    Comments: daughter reports increased assist over the last few weeks        OT Problem List: Decreased strength;Decreased range of motion;Decreased activity tolerance;Impaired balance (sitting and/or standing);Decreased coordination;Decreased cognition;Decreased safety awareness;Decreased knowledge of use of DME or AE;Decreased knowledge of precautions;Pain;Impaired UE functional use      OT Treatment/Interventions: Self-care/ADL training;Balance training;Patient/family education;Therapeutic activities;DME and/or AE instruction    OT Goals(Current goals can be found in the care plan section) Acute Rehab OT Goals Patient Stated Goal: to get her into a rehab  OT Goal Formulation: With family Time For Goal Achievement: 02/17/19 Potential to Achieve Goals: Fair  OT Frequency: Min 2X/week   Barriers to D/C:            Co-evaluation              AM-PAC OT "6 Clicks" Daily Activity     Outcome Measure Help from another person eating meals?: A Lot Help from another person taking care of personal grooming?: A Lot Help from another person toileting, which includes using toliet, bedpan, or urinal?: Total Help from another person bathing (including washing, rinsing, drying)?: Total Help from another person to put on and taking off regular upper body clothing?: A Lot Help from another person to put on and taking off regular lower body clothing?: Total 6 Click Score: 9   End of Session Nurse Communication: Mobility status  Activity Tolerance: Patient limited by fatigue Patient left: in bed;with call bell/phone  within reach;with bed alarm set;with family/visitor present  OT Visit Diagnosis: Other abnormalities of gait and mobility (R26.89);Pain;Other symptoms and signs involving cognitive function;Muscle weakness (generalized) (M62.81);Repeated falls (R29.6) Pain - Right/Left: (bil) Pain - part of body: Leg;Ankle and joints of foot                 Time: 3500-9381 OT Time Calculation (min): 25 min Charges:  OT General Charges $OT Visit: 1 Visit OT Evaluation $OT Eval Moderate Complexity: 1 Mod OT Treatments $Self Care/Home Management : 8-22 mins  Chancy Milroy, OT Acute Rehabilitation Services Pager [phone] Office [phone]   Chancy Milroy 02/03/2019, 2:11 PM

## 2019-02-03 NOTE — Progress Notes (Signed)
PROGRESS NOTE    Jasmine Carrillo  GDJ:[id number] DOB: 1940/11/07 DOA: 02/01/2019 PCP: Harlan Stains, MD  Brief Narrative: 78 year old female with history of CAD, CABG in 2008, dementia-possible Parkinson's, dyslipidemia, chronic constipation, depression was brought to the ER by her daughter due to recurrent falls, -She was seen in the ER originally on 9/3, after 3 falls, diagnosed with right ankle, talus fracture short-leg splint placed, postop boot and then went home, recommended nonweightbearing and orthopedics follow-up after she went back home she fell again and subsequently brought back to the ER and was admitted last night   Assessment & Plan:   Recurrent falls, Gait disorder -Suspect this may be secondary to advanced age, dementia and movement disorder -Gait disorder has been an ongoing issue, seen by Dr. Jannifer Franklin with [facility] neurology for this -Doubt polypharmacy is contributing, no new medications , unfortunately unable to check orthostatics given nonweightbearing status -PT OT evaluation -Social work and case management consult  Right ankle fracture and left great toe phalanx fracture: -Has short leg splint on right leg and postoperative boot-left foot from ER trip on 9/2 -Nonweightbearing advised for 6 weeks. -Tylenol for pain control. 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  She was started on Depakote for myoclonic jerking in her arms.  Lethargy Daughter feels that patient is more lethargic today Repeat labs today and check urinalysis. Urine appears to be concentrated.  Will start on gentle IV fluids.  Hyperlipidemia - continue simvastatin  Depression:  -Hold Lexapro in setting of myoclonic jerks  Hypertension -: Blood pressure is slightly elevated -Continue carvedilol  Chronic constipation: -Ordered MiraLAX.  She is having bowel movements  DVT prophylaxis: Lovenox Code Status:  DNR, discussed by Dr. Broadus John with daughter family Communication:  Discussed with daughter at the bedside disposition Plan:  Awaiting skilled nursing facility placement   Procedures:   Antimicrobials:    Subjective: Her daughter feels that she is more somnolent today.  Her p.o. intake has been poor.  She continues to complain of back pain.  Objective: Vitals:   02/02/19 2100 02/03/19 0344 02/03/19 1030 02/03/19 1539  BP: (!) 160/102 (!) 150/80 (!) 148/80 (!) 144/81  Pulse: 87 75 72 79  Resp: 14 16 20 16   Temp: 98.4 F (36.9 C) 98.7 F (37.1 C) 98.6 F (37 C) 98 F (36.7 C)  TempSrc: Oral Oral Oral Oral  SpO2: 96% 96% 95% 93%    Intake/Output Summary (Last 24 hours) at 02/03/2019 1912 Last data filed at 02/03/2019 1100 Gross per 24 hour  Intake 0 ml  Output 400 ml  Net -400 ml   There were no vitals filed for this visit.  Examination:  General exam: Alert, awake, no distress Respiratory system: Clear to auscultation. Respiratory effort normal. Cardiovascular system:RRR. No murmurs, rubs, gallops. Gastrointestinal system: Abdomen is nondistended, soft and nontender. No organomegaly or masses felt. Normal bowel sounds heard. Central nervous system: Confused.  Appears to have myoclonic jerking of her upper extremities.  Repetitive movement of her lips/tongue while her eyes are closed, resolved when she is awake. Extremities: Both lower extremities are in brace/dressings. Skin: No rashes, lesions or ulcers Psychiatry: Unable to assess   Data Reviewed:   CBC: Recent Labs  Lab  02/01/19 1533 02/01/19 1733 02/02/19 0432 02/03/19 1634  WBC 5.3 5.9 5.8 6.2  NEUTROABS 3.6  --   --   --   HGB 13.3 12.7 12.7 13.8  HCT 41.6 40.8 39.1  42.6  MCV 95.9 97.6 94.9 94.2  PLT 218 222 208 254   Basic Metabolic Panel: Recent Labs  Lab 02/01/19 1533 02/01/19 1733 02/02/19 0432 02/03/19 1634  NA 141  --  140 138  K 4.0  --  3.4* 3.7  CL 108  --  107 104  CO2 25  --  23 20*  GLUCOSE 91  --  98 109*  BUN 15  --  13 15  CREATININE 0.79 0.68 0.60 0.64  CALCIUM 8.8*  --  8.6* 9.0  MG  --  2.2  --   --   PHOS  --  3.3  --   --    GFR: CrCl cannot be calculated (Unknown ideal weight.). Liver Function Tests: Recent Labs  Lab 02/02/19 0432  AST 19  ALT 14  ALKPHOS 57  BILITOT 0.8  PROT 5.9*  ALBUMIN 3.3*   No results for input(s): LIPASE, AMYLASE in the last 168 hours. Recent Labs  Lab 02/03/19 1634  AMMONIA 21   Coagulation Profile: No results for input(s): INR, PROTIME in the last 168 hours. Cardiac Enzymes: No results for input(s): CKTOTAL, CKMB, CKMBINDEX, TROPONINI in the last 168 hours. BNP (last 3 results) No results for input(s): PROBNP in the last 8760 hours. HbA1C: No results for input(s): HGBA1C in the last 72 hours. CBG: No results for input(s): GLUCAP in the last 168 hours. Lipid Profile: Recent Labs    02/01/19 2233  CHOL 162  HDL 72  LDLCALC 80  TRIG 51  CHOLHDL 2.3   Thyroid Function Tests: Recent Labs    02/01/19 1733  TSH 0.858   Anemia Panel: Recent Labs    02/01/19 1733  VITAMINB12 415   Urine analysis:    Component Value Date/Time   COLORURINE YELLOW 02/01/2019 1615   APPEARANCEUR CLEAR 02/01/2019 1615   LABSPEC 1.016 02/01/2019 1615   PHURINE 5.0 02/01/2019 1615   GLUCOSEU NEGATIVE 02/01/2019 1615   HGBUR NEGATIVE 02/01/2019 1615   BILIRUBINUR NEGATIVE 02/01/2019 1615   KETONESUR NEGATIVE 02/01/2019 1615   PROTEINUR NEGATIVE 02/01/2019 1615   NITRITE NEGATIVE 02/01/2019 1615   LEUKOCYTESUR NEGATIVE 02/01/2019 1615   Sepsis Labs: @LABRCNTIP (procalcitonin:4,lacticidven:4)  ) Recent Results (from the past 240 hour(s))  SARS Coronavirus 2 [facility] order,  Performed in [facility] hospital lab) Nasopharyngeal Nasopharyngeal Swab     Status: None   Collection Time: 02/01/19  4:36 PM   Specimen: Nasopharyngeal Swab  Result Value Ref Range Status   SARS Coronavirus 2 NEGATIVE NEGATIVE Final    Comment: (NOTE) If result is NEGATIVE SARS-CoV-2 target nucleic acids are NOT DETECTED. The SARS-CoV-2 RNA is generally detectable in upper and lower  respiratory specimens during the acute phase of infection. The lowest  concentration of SARS-CoV-2 viral copies this assay can detect is 250  copies / mL. A negative result does not preclude SARS-CoV-2 infection  and should not be used as the sole basis for treatment or other  patient management decisions.  A negative result may occur with  improper specimen collection / handling, submission of specimen other  than nasopharyngeal swab, presence of viral mutation(s) within the  areas targeted by this assay, and inadequate number of viral copies  (<250 copies / mL). A negative result must be combined  with clinical  observations, patient history, and epidemiological information. If result is POSITIVE SARS-CoV-2 target nucleic acids are DETECTED. The SARS-CoV-2 RNA is generally detectable in upper and lower  respiratory specimens dur ing the acute phase of infection.  Positive  results are indicative of active infection with SARS-CoV-2.  Clinical  correlation with patient history and other diagnostic information is  necessary to determine patient infection status.  Positive results do  not rule out bacterial infection or co-infection with other viruses. If result is PRESUMPTIVE POSTIVE SARS-CoV-2 nucleic acids MAY BE PRESENT.   A presumptive positive result was obtained on the submitted specimen  and confirmed on repeat testing.  While 2019 novel coronavirus  (SARS-CoV-2) nucleic acids may be present in the submitted sample  additional confirmatory testing may be necessary for epidemiological  and / or  clinical management purposes  to differentiate between  SARS-CoV-2 and other Sarbecovirus currently known to infect humans.  If clinically indicated additional testing with an alternate test  methodology [phone] is advised. The SARS-CoV-2 RNA is generally  detectable in upper and lower respiratory sp ecimens during the acute  phase of infection. The expected result is Negative. Fact Sheet for Patients:  BoilerBrush.com.cyhttps://www.fda.gov/media/136312/download Fact Sheet for Healthcare Providers: https://pope.com/https://www.fda.gov/media/136313/download This test is not yet approved or cleared by the [place] FDA and has been authorized for detection and/or diagnosis of SARS-CoV-2 by FDA under an Emergency Use Authorization (EUA).  This EUA will remain in effect (meaning this test can be used) for the duration of the COVID-19 declaration under Section 564(b)(1) of the Act, 21 U.S.C. section 360bbb-3(b)(1), unless the authorization is terminated or revoked sooner. Performed at [facility], [address] [place], [place] [address]          Radiology Studies: No results found.      Scheduled Meds: . aspirin  81 mg Oral Daily  . carvedilol  6.25 mg Oral BID  . donepezil  10 mg Oral QPM  . enoxaparin (LOVENOX) injection  40 mg Subcutaneous Q24H  . mouth rinse  15 mL Mouth Rinse q12n4p  . memantine  10 mg Oral BID  . polyethylene glycol  17 g Oral Daily  . senna-docusate  1 tablet Oral BID  . simvastatin  40 mg Oral QPM   Continuous Infusions: . sodium chloride 75 mL/hr at 02/03/19 1904     LOS: 1 day    Time spent: 35min    Erick BlinksJehanzeb Andriea Hasegawa, MD [facility] Page via [email or web address] After 7PM please contact night-coverage  02/03/2019, 7:12 PM

## 2019-02-04 LAB — CBC
HCT: 38.9 % (ref 36.0–46.0)
Hemoglobin: 12.7 g/dL (ref 12.0–15.0)
MCH: 30.5 pg (ref 26.0–34.0)
MCHC: 32.6 g/dL (ref 30.0–36.0)
MCV: 93.5 fL (ref 80.0–100.0)
Platelets: 225 10*3/uL (ref 150–400)
RBC: 4.16 MIL/uL (ref 3.87–5.11)
RDW: 13.3 % (ref 11.5–15.5)
WBC: 6.2 10*3/uL (ref 4.0–10.5)
nRBC: 0 % (ref 0.0–0.2)

## 2019-02-04 LAB — BASIC METABOLIC PANEL
Anion gap: 7 (ref 5–15)
BUN: 17 mg/dL (ref 8–23)
CO2: 25 mmol/L (ref 22–32)
Calcium: 8.5 mg/dL — ABNORMAL LOW (ref 8.9–10.3)
Chloride: 107 mmol/L (ref 98–111)
Creatinine, Ser: 0.6 mg/dL (ref 0.44–1.00)
GFR calc Af Amer: >60 mL/min (ref >60)
GFR calc non Af Amer: >60 mL/min (ref >60)
Glucose, Bld: 101 mg/dL — ABNORMAL HIGH (ref 70–99)
Potassium: 3.4 mmol/L — ABNORMAL LOW (ref 3.5–5.1)
Sodium: 139 mmol/L (ref 135–145)

## 2019-02-04 LAB — GLUCOSE, CAPILLARY: Glucose-Capillary: 117 mg/dL — ABNORMAL HIGH (ref 70–99)

## 2019-02-04 MED ORDER — POTASSIUM CHLORIDE 20 MEQ PO PACK
40.0000 meq | PACK | Freq: Once | ORAL | Status: AC
  Administered 2019-02-04: 40 meq via ORAL
  Filled 2019-02-04: qty 2

## 2019-02-04 NOTE — [place] FL2 (Addendum)
[place] MEDICAID FL2 LEVEL OF CARE SCREENING TOOL     IDENTIFICATION  Patient Name: Jasmine Landsberglena Jurgens Birthdate: [birth year] Sex: female Admission Date (Current Location): 02/01/2019  [facility] [place]   Facility and Address:  [facility] [employer], [address], [place], [place] [address]      Provider Number: [id number]  Attending Physician Name and Address:  Shon HaleEmokpae, Courage, MD  Relative Name and Phone Number:  Marrian SalvageShawn Heiser, daughter, [phone]    Current Level of Care: Hospital Recommended Level of Care: Skilled Nursing Facility Prior Approval Number:    Date Approved/Denied:   PASRR Number: [id number] Discharge Plan: SNF    Current Diagnoses: Patient Active Problem List   Diagnosis Date Noted  . Recurrent falls 02/01/2019    Orientation RESPIRATION BLADDER Height & Weight     Self  Normal Incontinent, External catheter Weight:   Height:     BEHAVIORAL SYMPTOMS/MOOD NEUROLOGICAL BOWEL NUTRITION STATUS      Continent Diet(see discharge summary)  AMBULATORY STATUS COMMUNICATION OF NEEDS Skin   Extensive Assist Verbally Other (Comment)(generalized ecchymosis on sacrum and arm)                       Personal Care Assistance Level of Assistance  Bathing, Dressing, Feeding Bathing Assistance: Maximum assistance Feeding assistance: Maximum assistance Dressing Assistance: Maximum assistance     Functional Limitations Info  Sight, Hearing, Speech Sight Info: Adequate Hearing Info: Adequate Speech Info: Adequate    SPECIAL CARE FACTORS FREQUENCY  PT (By licensed PT), OT (By licensed OT)     PT Frequency: 5x week OT Frequency: 5x week            Contractures Contractures Info: Not present    Additional Factors Info  Code Status, Allergies, Psychotropic Code Status Info: DNR Allergies Info: No Known Allergies Psychotropic Info: memantine (NAMENDA) tablet 10 mg 2x day PO; donepezil (ARICEPT) tablet 10 mg  every evening PO         Current Medications (02/04/2019):  This is the current hospital active medication list Current Facility-Administered Medications  Medication Dose Route Frequency Provider Last Rate Last Dose  . 0.9 %  sodium chloride infusion   Intravenous Continuous Erick BlinksMemon, Jehanzeb, MD 75 mL/hr at 02/04/19 0728    . acetaminophen (TYLENOL) tablet 650 mg  650 mg Oral Q6H PRN Pahwani, Rinka R, MD   650 mg at 02/04/19 0339   Or  . acetaminophen (TYLENOL) suppository 650 mg  650 mg Rectal Q6H PRN Pahwani, Rinka R, MD      . aspirin chewable tablet 81 mg  81 mg Oral Daily Pahwani, Rinka R, MD   81 mg at 02/04/19 0827  . carvedilol (COREG) tablet 6.25 mg  6.25 mg Oral BID Pahwani, Rinka R, MD   6.25 mg at 02/04/19 0827  . divalproex (DEPAKOTE) DR tablet 250 mg  250 mg Oral Daily Erick BlinksMemon, Jehanzeb, MD   250 mg at 02/04/19 0917  . donepezil (ARICEPT) tablet 10 mg  10 mg Oral QPM Pahwani, Rinka R, MD   10 mg at 02/03/19 1854  . enoxaparin (LOVENOX) injection 40 mg  40 mg Subcutaneous Q24H Pahwani, Rinka R, MD   40 mg at 02/03/19 2109  . HYDROcodone-acetaminophen (NORCO) 7.5-325 MG per tablet 1 tablet  1 tablet Oral Q6H PRN Pahwani, Kasandra Knudseninka R, MD   1 tablet at 02/04/19 0827  . MEDLINE mouth rinse  15 mL Mouth Rinse q12n4p Zannie CoveJoseph, Preetha, MD  15 mL at 02/03/19 1854  . memantine (NAMENDA) tablet 10 mg  10 mg Oral BID Pahwani, Rinka R, MD   10 mg at 02/04/19 0827  . polyethylene glycol (MIRALAX / GLYCOLAX) packet 17 g  17 g Oral Daily Pahwani, Rinka R, MD   17 g at 02/04/19 0827  . senna-docusate (Senokot-S) tablet 1 tablet  1 tablet Oral BID Domenic Polite, MD   1 tablet at 02/04/19 [phone]  . simvastatin (ZOCOR) tablet 40 mg  40 mg Oral QPM Pahwani, Rinka R, MD   40 mg at 02/03/19 1854     Discharge Medications: Please see discharge summary for a list of discharge medications.  Relevant Imaging Results:  Relevant Lab Results:   Additional Information SS# [place],  [place]

## 2019-02-04 NOTE — Progress Notes (Signed)
PROGRESS NOTE    Jasmine Carrillo  GMW:[id number] DOB: 1941-04-07 DOA: 02/01/2019 PCP: Laurann Montana, MD  Brief Narrative: 78 year old female with history of CAD, CABG in 2008, dementia-possible Parkinson's, dyslipidemia, chronic constipation, depression was brought to the ER by her daughter due to recurrent falls, -She was seen in the ER originally on 9/3, after 3 falls, diagnosed with right ankle, talus fracture short-leg splint placed, postop boot and then went home, recommended nonweightbearing and orthopedics follow-up after she went back home she fell again and subsequently brought back to the ER and was admitted last night -Awaiting possible transfer to SNF rehab  Assessment & Plan:   Recurrent falls, Gait disorder -Suspect this may be secondary to advanced age, dementia and movement disorder -Gait disorder has been an ongoing issue, seen by Dr. Anne Hahn with [facility] neurology for this -Doubt polypharmacy is contributing, no new medications , unfortunately unable to check orthostatics given nonweightbearing status -PT OT evaluation -Check a.m. cortisol levels -Social work and case management consult  Right ankle fracture and left great toe phalanx fracture: -Has short leg splint on right leg and postoperative boot-left foot from ER trip on 9/2 -Nonweightbearing advised for 6 weeks. -Tylenol for pain control. 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  She was started on Depakote for myoclonic jerking in her arms.  Lethargy Daughter feels that patient is more lethargic today Repeat labs today and check urinalysis. Urine appears to be concentrated.  Will start on gentle IV  fluids.  Hyperlipidemia - continue simvastatin  Depression: -Hold Lexapro in setting of myoclonic jerks  Hypertension -: Blood pressure is slightly elevated -Continue carvedilol  Chronic constipation: -Ordered MiraLAX.  She is having bowel movements  DVT prophylaxis: Lovenox Code Status:  DNR, discussed by Dr. Jomarie Longs with daughter family Communication:  Discussed with daughter by phone-[phone] Disposition Plan:  Awaiting skilled nursing facility placement    Procedures:   Antimicrobials:    Subjective: -Resting comfortably obvious cognitive deficits  -Poor historian Objective: Vitals:   02/03/19 2016 02/04/19 0506 02/04/19 0808 02/04/19 1238  BP: (!) 148/84 (!) 146/79 (!) 151/82 (!) 151/95  Pulse: 79 73 79 81  Resp:   16 16  Temp: 98.2 F (36.8 C) 97.8 F (36.6 C) 98.4 F (36.9 C) 97.8 F (36.6 C)  TempSrc: Oral Oral Oral Oral  SpO2: 91% 94% 95% 97%    Intake/Output Summary (Last 24 hours) at 02/04/2019 1730 Last data filed at 02/04/2019 1610 Gross per 24 hour  Intake 1952.21 ml  Output 1000 ml  Net 952.21 ml   There were no vitals filed for this visit.  Examination:  General exam: Alert, awake, no distress Respiratory system: Clear to auscultation. Respiratory effort normal. Cardiovascular system:RRR. No murmurs, rubs, gallops. Gastrointestinal system: Abdomen is nondistended, soft and nontender. No organomegaly or masses felt. Normal bowel sounds heard. Central nervous system: Confused.  Appears to have myoclonic jerking of her upper extremities.  Repetitive movement of her lips/tongue while her eyes are closed, resolved when she is awake. Extremities: Both lower extremities are in brace/dressings. Skin: No rashes, lesions or ulcers Psychiatry: Unable to assess   Data Reviewed:   CBC: Recent Labs  Lab 02/01/19 1533 02/01/19 1733 02/02/19 0432 02/03/19 1634  02/04/19 0420  WBC 5.3 5.9 5.8 6.2 6.2  NEUTROABS 3.6  --   --   --   --   HGB  13.3 12.7 12.7 13.8 12.7  HCT 41.6 40.8 39.1 42.6 38.9  MCV 95.9 97.6 94.9 94.2 93.5  PLT 218 222 208 254 225   Basic Metabolic Panel: Recent Labs  Lab 02/01/19 1533 02/01/19 1733 02/02/19 0432 02/03/19 1634 02/04/19 0420  NA 141  --  140 138 139  K 4.0  --  3.4* 3.7 3.4*  CL 108  --  107 104 107  CO2 25  --  23 20* 25  GLUCOSE 91  --  98 109* 101*  BUN 15  --  13 15 17   CREATININE 0.79 0.68 0.60 0.64 0.60  CALCIUM 8.8*  --  8.6* 9.0 8.5*  MG  --  2.2  --   --   --   PHOS  --  3.3  --   --   --    GFR: CrCl cannot be calculated (Unknown ideal weight.). Liver Function Tests: Recent Labs  Lab 02/02/19 0432  AST 19  ALT 14  ALKPHOS 57  BILITOT 0.8  PROT 5.9*  ALBUMIN 3.3*   No results for input(s): LIPASE, AMYLASE in the last 168 hours. Recent Labs  Lab 02/03/19 1634  AMMONIA 21   Coagulation Profile: No results for input(s): INR, PROTIME in the last 168 hours. Cardiac Enzymes: No results for input(s): CKTOTAL, CKMB, CKMBINDEX, TROPONINI in the last 168 hours. BNP (last 3 results) No results for input(s): PROBNP in the last 8760 hours. HbA1C: No results for input(s): HGBA1C in the last 72 hours. CBG: No results for input(s): GLUCAP in the last 168 hours. Lipid Profile: Recent Labs    02/01/19 2233  CHOL 162  HDL 72  LDLCALC 80  TRIG 51  CHOLHDL 2.3   Thyroid Function Tests: Recent Labs    02/01/19 1733  TSH 0.858   Anemia Panel: Recent Labs    02/01/19 1733  VITAMINB12 415   Urine analysis:    Component Value Date/Time   COLORURINE YELLOW 02/01/2019 1615   APPEARANCEUR CLEAR 02/01/2019 1615   LABSPEC 1.016 02/01/2019 1615   PHURINE 5.0 02/01/2019 1615   GLUCOSEU NEGATIVE 02/01/2019 1615   HGBUR NEGATIVE 02/01/2019 1615   BILIRUBINUR NEGATIVE 02/01/2019 1615   KETONESUR NEGATIVE 02/01/2019 1615   PROTEINUR NEGATIVE 02/01/2019 1615   NITRITE NEGATIVE 02/01/2019 1615   LEUKOCYTESUR NEGATIVE 02/01/2019 1615   Sepsis Labs:  @[email or web address] (procalcitonin:4,lacticidven:4)  ) Recent Results (from the past 240 hour(s))  SARS Coronavirus 2 [facility] 2(Hospital order, Performed in [facility] hospital lab) Nasopharyngeal Nasopharyngeal Swab     Status: None   Collection Time: 02/01/19  4:36 PM   Specimen: Nasopharyngeal Swab  Result Value Ref Range Status   SARS Coronavirus 2 NEGATIVE NEGATIVE Final    Comment: (NOTE) If result is NEGATIVE SARS-CoV-2 target nucleic acids are NOT DETECTED. The SARS-CoV-2 RNA is generally detectable in upper and lower  respiratory specimens during the acute phase of infection. The lowest  concentration of SARS-CoV-2 viral copies this assay can detect is 250  copies / mL. A negative result does not preclude SARS-CoV-2 infection  and should not be used as the sole basis for treatment or other  patient management decisions.  A negative result may occur with  improper specimen collection / handling, submission of specimen other  than nasopharyngeal swab, presence of viral mutation(s) within the  areas targeted by this  assay, and inadequate number of viral copies  (<250 copies / mL). A negative result must be combined with clinical  observations, patient history, and epidemiological information. If result is POSITIVE SARS-CoV-2 target nucleic acids are DETECTED. The SARS-CoV-2 RNA is generally detectable in upper and lower  respiratory specimens dur ing the acute phase of infection.  Positive  results are indicative of active infection with SARS-CoV-2.  Clinical  correlation with patient history and other diagnostic information is  necessary to determine patient infection status.  Positive results do  not rule out bacterial infection or co-infection with other viruses. If result is PRESUMPTIVE POSTIVE SARS-CoV-2 nucleic acids MAY BE PRESENT.   A presumptive positive result was obtained on the submitted specimen  and confirmed on repeat testing.  While 2019 novel coronavirus  (SARS-CoV-2)  nucleic acids may be present in the submitted sample  additional confirmatory testing may be necessary for epidemiological  and / or clinical management purposes  to differentiate between  SARS-CoV-2 and other Sarbecovirus currently known to infect humans.  If clinically indicated additional testing with an alternate test  methodology [phone] is advised. The SARS-CoV-2 RNA is generally  detectable in upper and lower respiratory sp ecimens during the acute  phase of infection. The expected result is Negative. Fact Sheet for Patients:  StrictlyIdeas.no Fact Sheet for Healthcare Providers: BankingDealers.co.za This test is not yet approved or cleared by the [place] FDA and has been authorized for detection and/or diagnosis of SARS-CoV-2 by FDA under an Emergency Use Authorization (EUA).  This EUA will remain in effect (meaning this test can be used) for the duration of the COVID-19 declaration under Section 564(b)(1) of the Act, 21 U.S.C. section 360bbb-3(b)(1), unless the authorization is terminated or revoked sooner. Performed at [facility], [place] [address] [place], [place] [address]       Radiology Studies: No results found.  Scheduled Meds: . aspirin  81 mg Oral Daily  . carvedilol  6.25 mg Oral BID  . divalproex  250 mg Oral Daily  . donepezil  10 mg Oral QPM  . enoxaparin (LOVENOX) injection  40 mg Subcutaneous Q24H  . mouth rinse  15 mL Mouth Rinse q12n4p  . memantine  10 mg Oral BID  . polyethylene glycol  17 g Oral Daily  . senna-docusate  1 tablet Oral BID  . simvastatin  40 mg Oral QPM   Continuous Infusions:    LOS: 2 days   [facility] Page via [email or web address] After 7PM please contact night-coverage  02/04/2019, 5:30 PM

## 2019-02-04 NOTE — TOC Initial Note (Signed)
Transition of Care [facility]) - Initial/Assessment Note    Patient Details  Name: Jasmine Carrillo MRN: [id number] Date of Birth: [birth year]  Transition of Care [facility]) CM/SW Contact:    Alexander Mt, [place] Phone Number: 02/04/2019, 10:13 AM  Clinical Narrative:                 Per notes pt only oriented to self. CSW reached out to pt daughter Jasmine Carrillo at [phone] and introduced self, role, reason for call. Pt from home with daughter (confirmed facesheet address and PCP). Pt daughter is a professor and has been able to keep pt home and minimize COVID risk. However she has noted a decline in pt over the past 6 weeks. She has had less appetite, needed more assist with ADL/IADLs and spoken about being "tired" multiple times. CSW discussed PT/OT recommendations and various areas for referral. CSW with pt daughter consent has sent referral to SNFs, [facility] through [facility], and requested that the MD order a palliative consult. Pt daughter would like some guidance from medical staff about what they feel would be best for her mother moving forward and would like to discuss rehab/[facility] vs home with hospice/hospice home.   At this time [facility] would consider pt once [place] conversations complete, her SNF referral has been sent and CSW requested that her colleague follow up with pt daughter. Pt daughter will be present at bedside with the exceptions of the following times: Monday 2-4pm, Tuesday 8-11am, Wednesday 9-12pm.   PASRR pending, [place] offices closed will leave paperwork for weekday coverage to complete with MD once office is open.   Expected Discharge Plan: Skilled Nursing Facility Barriers to Discharge: Ship broker, [place] (PASRR), Continued Medical Work up   Patient Goals and CMS Choice Patient states their goals for this hospitalization and ongoing recovery are:: for her to be comfortable and supported CMS Medicare.gov Compare Post Acute Care list provided  to:: Patient Choice offered to / list presented to : Patient  Expected Discharge Plan and Services Expected Discharge Plan: [place] In-house Referral: Clinical Social Work Discharge Planning Services: CM Consult Post Acute Care Choice: [place] arrangements for the past 2 months: [place]  Prior Living Arrangements/Services Living arrangements for the past 2 months: Single Family Home Lives with:: Adult Children Patient language and need for interpreter reviewed:: Yes(no needs) Do you feel safe going back to the place where you live?: Yes      Need for Family Participation in Patient Care: Yes (Comment)(assistance with ADLs/IADLs; support with decision making) Care giver support system in place?: Yes (comment)(adult daughter) Current home services: DME Criminal Activity/Legal Involvement Pertinent to Current Situation/Hospitalization: No - Comment as needed  Activities of Daily Living      Permission Sought/Granted Permission sought to share information with : Facility Sport and exercise psychologist, Family Supports Permission granted to share information with : No(pt documented as only oriented to self)  Share Information with NAME: Jasmine Carrillo     Permission granted to share info w Relationship: daughter  Permission granted to share info w Contact Information: [phone]  Emotional Assessment Appearance:: (telephonic assessment with daughter) Attitude/Demeanor/Rapport: (telephonic assessment with daughter) Affect (typically observed): (telephonic assessment with daughter) Orientation: : Oriented to Self Alcohol / Substance Use: Not Applicable Psych Involvement: No (comment)  Admission diagnosis:  Dysuria [R30.0] Failure to thrive in adult [R62.7] Fall, initial encounter [W19.XXXA] Patient Active Problem List   Diagnosis Date Noted  . Recurrent falls 02/01/2019  PCP:  Laurann MontanaWhite, Cynthia, MD Pharmacy:   [facility]  [address] - [place], [place] - [address] [place] [address] Phone: [phone] Fax: [phone]     Social Determinants of Health (SDOH) Interventions    Readmission Risk Interventions No flowsheet data found.

## 2019-02-05 ENCOUNTER — Other Ambulatory Visit: Payer: Self-pay

## 2019-02-05 ENCOUNTER — Encounter (HOSPITAL_COMMUNITY): Payer: Self-pay | Admitting: General Practice

## 2019-02-05 LAB — CORTISOL: Cortisol, Plasma: 18.4 ug/dL

## 2019-02-05 LAB — GLUCOSE, CAPILLARY: Glucose-Capillary: 96 mg/dL (ref 70–99)

## 2019-02-05 LAB — VITAMIN D 25 HYDROXY (VIT D DEFICIENCY, FRACTURES): Vit D, 25-Hydroxy: 38.2 ng/mL (ref 30.0–100.0)

## 2019-02-05 MED ORDER — ENSURE ENLIVE PO LIQD
237.0000 mL | Freq: Two times a day (BID) | ORAL | Status: DC
  Administered 2019-02-06 – 2019-02-08 (×5): 237 mL via ORAL

## 2019-02-05 NOTE — Progress Notes (Addendum)
PROGRESS NOTE    Jasmine Carrillo  ION:[id number] DOB: Feb 03, 1941 DOA: 02/01/2019 PCP: Harlan Stains, MD  Brief Narrative: 78 year old female with history of CAD, CABG in 2008, dementia-possible Parkinson's, dyslipidemia, chronic constipation, depression was brought to the ER by her daughter due to recurrent falls, -She was seen in the ER originally on 9/3, after 3 falls, diagnosed with right ankle, talus fracture short-leg splint placed, postop boot and then went home, recommended nonweightbearing and orthopedics follow-up after she went back home she fell again and subsequently brought back to the ER and was admitted last night -Awaiting possible transfer to SNF rehab  Assessment & Plan:   Recurrent falls, Gait disorder -Suspect this may be secondary to advanced age, dementia and movement disorder  -A.m. cortisol above 18 -Gait disorder has been an ongoing issue, seen by Dr. Jannifer Franklin with [facility] neurology for this -Doubt polypharmacy is contributing, no new medications , unfortunately unable to check orthostatics given nonweightbearing status -PT OT evaluation --Social work and case management consult  Right ankle/Talus fracture and left great toe phalanx fracture: Right talus fx -- Plan NWB in splint with likely cast conversion in office in another week or so. She should see Dr. Tamera Punt at discharge. Left great toe phalanx fx -- She may be WBAT in post-op shoe. However, if the CAM walker is more comfortable then can wear that instead. 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  She was started on Depakote for myoclonic jerking in her arms.  Hyperlipidemia - continue simvastatin  Depression: -Hold Lexapro in setting of myoclonic jerks  Hypertension -: Blood pressure is slightly elevated -Continue carvedilol  Chronic constipation: -Ordered MiraLAX.  She is having bowel movements  DVT prophylaxis: Lovenox Code Status:  DNR,  Family Communication:  Discussed with daughter by [phone] Disposition Plan:  Awaiting skilled nursing facility placement    Procedures:   Antimicrobials:    Subjective: -No new concerns, oral intake is fair  Objective: Vitals:   02/04/19 1238 02/04/19 1955 02/05/19 0331 02/05/19 0904  BP: (!) 151/95 (!) 148/94 (!) 187/91 (!) 143/87  Pulse: 81 96 79 82  Resp: 16 17 20 16   Temp: 97.8 F (36.6 C) 98.2 F (36.8 C) 98.2 F (36.8 C) 97.8 F (36.6 C)  TempSrc: Oral  Oral Oral  SpO2: 97% 96% 94% 96%    Intake/Output Summary (Last 24 hours) at 02/05/2019 1152 Last data filed at 02/05/2019 1100 Gross per 24 hour  Intake 2192.21 ml  Output 1800 ml  Net 392.21 ml   There were no vitals filed for this visit.  Examination:  General exam: Alert, awake, no distress Respiratory system: Clear to auscultation. Respiratory effort normal. Cardiovascular system:RRR. No murmurs, rubs, gallops. Gastrointestinal system: Abdomen is nondistended, soft and nontender. No organomegaly or masses felt. Normal bowel sounds heard. Central nervous system: Confused.  Appears to have myoclonic jerking of her upper extremities.  Repetitive movement of her lips/tongue while her eyes are closed, resolved when she is awake. Extremities: Both lower extremities are in brace/dressings. Skin: No rashes, lesions or ulcers Psychiatry: Unable to assess   Data Reviewed:  CBC: Recent Labs  Lab 02/01/19 1533 02/01/19 1733 02/02/19 0432 02/03/19 1634 02/04/19 0420  WBC 5.3 5.9 5.8 6.2 6.2  NEUTROABS 3.6  --   --    --   --   HGB 13.3 12.7 12.7 13.8 12.7  HCT 41.6 40.8 39.1 42.6 38.9  MCV 95.9 97.6 94.9 94.2 93.5  PLT 218 222 208 254 225   Basic Metabolic Panel: Recent Labs  Lab 02/01/19 1533 02/01/19 1733 02/02/19 0432 02/03/19 1634 02/04/19 0420  NA 141  --  140 138 139  K 4.0  --  3.4* 3.7 3.4*  CL 108  --  107 104 107  CO2 25  --  23 20* 25  GLUCOSE 91  --  98 109* 101*  BUN 15  --  13 15 17   CREATININE 0.79 0.68 0.60 0.64 0.60  CALCIUM 8.8*  --  8.6* 9.0 8.5*  MG  --  2.2  --   --   --   PHOS  --  3.3  --   --   --    GFR: CrCl cannot be calculated (Unknown ideal weight.). Liver Function Tests: Recent Labs  Lab 02/02/19 0432  AST 19  ALT 14  ALKPHOS 57  BILITOT 0.8  PROT 5.9*  ALBUMIN 3.3*   No results for input(s): LIPASE, AMYLASE in the last 168 hours. Recent Labs  Lab 02/03/19 1634  AMMONIA 21   Coagulation Profile: No results for input(s): INR, PROTIME in the last 168 hours. Cardiac Enzymes: No results for input(s): CKTOTAL, CKMB, CKMBINDEX, TROPONINI in the last 168 hours. BNP (last 3 results) No results for input(s): PROBNP in the last 8760 hours. HbA1C: No results for input(s): HGBA1C in the last 72 hours. CBG: Recent Labs  Lab 02/04/19 2105 02/05/19 0656  GLUCAP 117* 96   Lipid Profile: No results for input(s): CHOL, HDL, LDLCALC, TRIG, CHOLHDL, LDLDIRECT in the last 72 hours. Thyroid Function Tests: No results for input(s): TSH, T4TOTAL, FREET4, T3FREE, THYROIDAB in the last 72 hours. Anemia Panel: No results for input(s): VITAMINB12, FOLATE, FERRITIN, TIBC, IRON, RETICCTPCT in the last 72 hours. Urine analysis:    Component Value Date/Time   COLORURINE YELLOW 02/01/2019 1615   APPEARANCEUR CLEAR 02/01/2019 1615   LABSPEC 1.016 02/01/2019 1615   PHURINE 5.0 02/01/2019 1615   GLUCOSEU NEGATIVE 02/01/2019 1615   HGBUR NEGATIVE 02/01/2019 1615   BILIRUBINUR NEGATIVE 02/01/2019 1615   KETONESUR NEGATIVE 02/01/2019 1615   PROTEINUR NEGATIVE  02/01/2019 1615   NITRITE NEGATIVE 02/01/2019 1615   LEUKOCYTESUR NEGATIVE 02/01/2019 1615   Sepsis Labs: @LABRCNTIP (procalcitonin:4,lacticidven:4)  ) Recent Results (from the past 240 hour(s))  SARS Coronavirus 2 [facility](Hospital order, Performed in [facility] hospital lab) Nasopharyngeal Nasopharyngeal Swab     Status: None   Collection Time: 02/01/19  4:36 PM   Specimen: Nasopharyngeal Swab  Result Value Ref Range Status   SARS Coronavirus 2 NEGATIVE NEGATIVE Final    Comment: (NOTE) If result is NEGATIVE SARS-CoV-2 target nucleic acids are NOT DETECTED. The SARS-CoV-2 RNA is generally detectable in upper and lower  respiratory specimens during the acute phase of infection. The lowest  concentration of SARS-CoV-2 viral copies this assay can detect is 250  copies / mL. A negative result does not preclude SARS-CoV-2 infection  and should not be used as the sole basis for treatment or other  patient management decisions.  A negative result may occur with  improper specimen collection / handling, submission of specimen other  than  nasopharyngeal swab, presence of viral mutation(s) within the  areas targeted by this assay, and inadequate number of viral copies  (<250 copies / mL). A negative result must be combined with clinical  observations, patient history, and epidemiological information. If result is POSITIVE SARS-CoV-2 target nucleic acids are DETECTED. The SARS-CoV-2 RNA is generally detectable in upper and lower  respiratory specimens dur ing the acute phase of infection.  Positive  results are indicative of active infection with SARS-CoV-2.  Clinical  correlation with patient history and other diagnostic information is  necessary to determine patient infection status.  Positive results do  not rule out bacterial infection or co-infection with other viruses. If result is PRESUMPTIVE POSTIVE SARS-CoV-2 nucleic acids MAY BE PRESENT.   A presumptive positive result was obtained on  the submitted specimen  and confirmed on repeat testing.  While 2019 novel coronavirus  (SARS-CoV-2) nucleic acids may be present in the submitted sample  additional confirmatory testing may be necessary for epidemiological  and / or clinical management purposes  to differentiate between  SARS-CoV-2 and other Sarbecovirus currently known to infect humans.  If clinically indicated additional testing with an alternate test  methodology [phone]) is advised. The SARS-CoV-2 RNA is generally  detectable in upper and lower respiratory sp ecimens during the acute  phase of infection. The expected result is Negative. Fact Sheet for Patients:  BoilerBrush.com.cy Fact Sheet for Healthcare Providers: https://pope.com/ This test is not yet approved or cleared by the [place] FDA and has been authorized for detection and/or diagnosis of SARS-CoV-2 by FDA under an Emergency Use Authorization (EUA).  This EUA will remain in effect (meaning this test can be used) for the duration of the COVID-19 declaration under Section 564(b)(1) of the Act, 21 U.S.C. section 360bbb-3(b)(1), unless the authorization is terminated or revoked sooner. Performed at [facility], [address] [place], [place] [address]       Radiology Studies: No results found.  Scheduled Meds:  aspirin  81 mg Oral Daily   carvedilol  6.25 mg Oral BID   divalproex  250 mg Oral Daily   donepezil  10 mg Oral QPM   enoxaparin (LOVENOX) injection  40 mg Subcutaneous Q24H   mouth rinse  15 mL Mouth Rinse q12n4p   memantine  10 mg Oral BID   polyethylene glycol  17 g Oral Daily   senna-docusate  1 tablet Oral BID   simvastatin  40 mg Oral QPM   Continuous Infusions:    LOS: 3 days   [facility] Page via Investment banker, [email or web address] After 7PM please contact night-coverage  02/05/2019, 11:52 AM

## 2019-02-05 NOTE — Consult Note (Signed)
Reason for Consult:Bilateral foot fxs Referring Physician: C Emokpae  Luster Landsberglena Carrillo is an 78 y.o. female.  HPI: Jasmine Carrillo has a history of multiple falls and fell again 9/3. At the time she was diagnosed with a right tarsal fx and a left phalanx fx. She was discharged home in the care of her daughter. However, she fell again and was noted to be confused and was brought back the next day and admitted. Orthopedic surgery was consulted at the time of the first fall but was asked to see her here to provide WB guidance. She is demented and cannot contribute to history.  Past Medical History:  Diagnosis Date  . Anxiety   . Depression     Past Surgical History:  Procedure Laterality Date  . CORONARY ARTERY BYPASS GRAFT    . NECK SURGERY     Neck bone replacement     No family history on file.  Social History:  reports that she quit smoking about 12 years ago. She has never used smokeless tobacco. She reports current alcohol use. She reports that she does not use drugs.  Allergies: No Known Allergies  Medications: I have reviewed the patient's current medications.  Results for orders placed or performed during the hospital encounter of 02/01/19 (from the past 48 hour(s))  Basic metabolic panel     Status: Abnormal   Collection Time: 02/03/19  4:34 PM  Result Value Ref Range   Sodium 138 135 - 145 mmol/L   Potassium 3.7 3.5 - 5.1 mmol/L   Chloride 104 98 - 111 mmol/L   CO2 20 (L) 22 - 32 mmol/L   Glucose, Bld 109 (H) 70 - 99 mg/dL   BUN 15 8 - 23 mg/dL   Creatinine, Ser 1.610.64 0.44 - 1.00 mg/dL   Calcium 9.0 8.9 - 09.610.3 mg/dL   GFR calc non Af Amer >60 >60 mL/min   GFR calc Af Amer >60 >60 mL/min   Anion gap 14 5 - 15    Comment: Performed at [facility], [address] [place], [place] [address]  CBC     Status: None   Collection Time: 02/03/19  4:34 PM  Result Value Ref Range   WBC 6.2 4.0 - 10.5 K/uL   RBC 4.52 3.87 - 5.11 MIL/uL   Hemoglobin 13.8 12.0 - 15.0 g/dL   HCT  98.142.6 19.136.0 - 47.846.0 %   MCV 94.2 80.0 - 100.0 fL   MCH 30.5 26.0 - 34.0 pg   MCHC 32.4 30.0 - 36.0 g/dL   RDW 29.513.5 62.111.5 - 30.815.5 %   Platelets 254 150 - 400 K/uL   nRBC 0.0 0.0 - 0.2 %    Comment: Performed at [facility], [address] [place], [place] [address]  Ammonia     Status: None   Collection Time: 02/03/19  4:34 PM  Result Value Ref Range   Ammonia 21 9 - 35 umol/L    Comment: Performed at [facility], [address] [place], [place] [address]  Basic metabolic panel     Status: Abnormal   Collection Time: 02/04/19  4:20 AM  Result Value Ref Range   Sodium 139 135 - 145 mmol/L   Potassium 3.4 (L) 3.5 - 5.1 mmol/L   Chloride 107 98 - 111 mmol/L   CO2 25 22 - 32 mmol/L   Glucose, Bld 101 (H) 70 - 99 mg/dL   BUN 17 8 - 23 mg/dL   Creatinine, Ser  0.60 0.44 - 1.00 mg/dL   Calcium 8.5 (L) 8.9 - 10.3 mg/dL   GFR calc non Af Amer >60 >60 mL/min   GFR calc Af Amer >60 >60 mL/min   Anion gap 7 5 - 15    Comment: Performed at [facility] [place] [address] [place], [place] [address]  CBC     Status: None   Collection Time: 02/04/19  4:20 AM  Result Value Ref Range   WBC 6.2 4.0 - 10.5 K/uL   RBC 4.16 3.87 - 5.11 MIL/uL   Hemoglobin 12.7 12.0 - 15.0 g/dL   HCT 38.9 36.0 - 46.0 %   MCV 93.5 80.0 - 100.0 fL   MCH 30.5 26.0 - 34.0 pg   MCHC 32.6 30.0 - 36.0 g/dL   RDW 13.3 11.5 - 15.5 %   Platelets 225 150 - 400 K/uL   nRBC 0.0 0.0 - 0.2 %    Comment: Performed at [facility], [place] [address], [place], [place] [address]  Glucose, capillary     Status: Abnormal   Collection Time: 02/04/19  9:05 PM  Result Value Ref Range   Glucose-Capillary 117 (H) 70 - 99 mg/dL  Cortisol     Status: None   Collection Time: 02/05/19  4:33 AM  Result Value Ref Range   Cortisol, Plasma 18.4 ug/dL    Comment: (NOTE) AM    6.7 - 22.6 ug/dL PM   <10.0       ug/dL Performed at [facility] [address], [place], [place] [address]   Glucose, capillary      Status: None   Collection Time: 02/05/19  6:56 AM  Result Value Ref Range   Glucose-Capillary 96 70 - 99 mg/dL    No results found.  Review of Systems  Unable to perform ROS: Dementia   Blood pressure (!) 143/87, pulse 82, temperature 97.8 F (36.6 C), temperature source Oral, resp. rate 16, SpO2 96 %. Physical Exam  Constitutional: She appears well-developed and well-nourished. No distress.  HENT:  Head: Normocephalic and atraumatic.  Eyes: Conjunctivae are normal. Right eye exhibits no discharge. Left eye exhibits no discharge. No scleral icterus.  Neck: Normal range of motion.  Cardiovascular: Normal rate and regular rhythm.  Respiratory: Effort normal. No respiratory distress.  Musculoskeletal:     Comments: RLE No traumatic wounds, ecchymosis, or rash  Short leg splint in place  No knee or ankle effusion  Knee stable to varus/ valgus and anterior/posterior stress  Sens DPN, SPN, TN grossly intact  Motor EHL 4/5  No significant edema  LLE No traumatic wounds, ecchymosis, or rash  CAM in place  No knee or ankle effusion  Knee stable to varus/ valgus and anterior/posterior stress  Sens DPN, SPN, TN grossly intact  Motor EHL, ext, flex, evers 3/5  No significant edema  Neurological: She is alert.  Skin: Skin is warm and dry. She is not diaphoretic.  Psychiatric: She has a normal mood and affect. Her behavior is normal.    Assessment/Plan: Right talus fx -- Plan NWB in splint with likely cast conversion in office in another week or so. She should see Dr. Tamera Punt at discharge. Left great toe phalanx fx -- She may be WBAT in post-op shoe. However, if the CAM walker is more comfortable then can wear that instead. Multiple medical problems including coronary artery disease status post CABG in 2008, hypertension, dementia, hyperlipidemia, chronic constipation, depression, and Parkinson's -- per primary  service    Freeman Caldron, PA-C Orthopedic  Surgery [phone] 02/05/2019, 12:13 PM

## 2019-02-05 NOTE — Progress Notes (Signed)
Physical Therapy Treatment Patient Details Name: Jasmine Carrillo MRN: [id number] DOB: 1941/02/12 Today's Date: 02/05/2019    History of Present Illness Ms Jasmine Landsberglena Guile, 78y/f, presented to ED due to concern of recurrent falls. PMH of coronary artery disease status post CABG in 2008, hypertension, dementia, hyperlipidemia, chronic constipation, depression. Pt was recently diagnosed with a talus fracture in right ankle (NWB) and phalanx fracture on left great toe.  Short leg splint to right leg and [facility], advised nonweightbearing for 4 to 6 weeks, she discharged home in a stable condition. On morning of admission pt fell again. Pt was recently diagnosed with Parkinson's Disease.    PT Comments    Pt performed transfer training including supine to sit, sit to supine and rolling to R and L side.  She required total assistance for all aspects of mobility.  Pt with B UE tremors that made ADLs difficult.  Pt continues to benefit from SNF placement at d/c to improve strength and function.  She is following minimal commands and would require total assistance if she returned home.    Follow Up Recommendations  SNF;Supervision/Assistance - 24 hour;Other (comment)     Equipment Recommendations  Other (comment)(TBD)    Recommendations for Other Services       Precautions / Restrictions Precautions Precautions: Fall Required Braces or Orthoses: Splint/Cast;Other Brace Splint/Cast: R short leg cast Other Brace: CAM walker to L LE  Restrictions Weight Bearing Restrictions: Yes RLE Weight Bearing: Non weight bearing Other Position/Activity Restrictions: unsure of weight bearing on LLE so treated as NWB.    Mobility  Bed Mobility Overal bed mobility: Needs Assistance Bed Mobility: Supine to Sit;Sit to Supine     Supine to sit: Total assist Sit to supine: Total assist   General bed mobility comments: total +2 for all aspects of mobility to move from  supine<>sit.  Transfers Overall transfer level: Needs assistance   Transfers: Licensed conveyancerAnterior-Posterior Transfer       Anterior-Posterior transfers: Total assist;+2 physical assistance   General transfer comment: use of bed pad to scoot posteriorly oob to move from bed to recliner.  Ambulation/Gait                 Stairs             Wheelchair Mobility    Modified Rankin (Stroke Patients Only)       Balance Overall balance assessment: Needs assistance Sitting-balance support: Bilateral upper extremity supported;Feet unsupported Sitting balance-Leahy Scale: Fair Sitting balance - Comments: poor initially but as she sat she was able to hold her balance unassisted for brief periods of time. Postural control: Posterior lean;Right lateral lean     Standing balance comment: NT                            Cognition Arousal/Alertness: Awake/alert Behavior During Therapy: WFL for tasks assessed/performed Overall Cognitive Status: History of cognitive impairments - at baseline                                 General Comments: Baseline dementia and oriented to nothing.  Pt responds well to 70s oldies music.  Unable to follow commands with verbal cues required tactile guidance to complete ADLs.      Exercises General Exercises - Lower Extremity Long Arc Quad: AROM;Right;10 reps;Supine    General Comments  Pertinent Vitals/Pain Pain Assessment: Faces Faces Pain Scale: Hurts little more Pain Location: grimacing with movement  Pain Descriptors / Indicators: Grimacing Pain Intervention(s): Monitored during session;Repositioned    Home Living                      Prior Function            PT Goals (current goals can now be found in the care plan section) Acute Rehab PT Goals Patient Stated Goal: none stated Potential to Achieve Goals: Fair Progress towards PT goals: Progressing toward goals    Frequency    Min  2X/week      PT Plan Current plan remains appropriate    Co-evaluation PT/OT/SLP Co-Evaluation/Treatment: Yes Reason for Co-Treatment: Complexity of the patient's impairments (multi-system involvement);For patient/therapist safety;Necessary to address cognition/behavior during functional activity PT goals addressed during session: Mobility/safety with mobility OT goals addressed during session: ADL's and self-care      AM-PAC PT "6 Clicks" Mobility   Outcome Measure  Help needed turning from your back to your side while in a flat bed without using bedrails?: Total Help needed moving from lying on your back to sitting on the side of a flat bed without using bedrails?: Total Help needed moving to and from a bed to a chair (including a wheelchair)?: Total Help needed standing up from a chair using your arms (e.g., wheelchair or bedside chair)?: Total Help needed to walk in hospital room?: Total Help needed climbing 3-5 steps with a railing? : Total 6 Click Score: 6    End of Session Equipment Utilized During Treatment: Gait belt Activity Tolerance: Patient limited by pain Patient left: in bed;with call bell/phone within reach;with bed alarm set;Other (comment) Nurse Communication: Mobility status;Other (comment)(need for purwick.  plan for back to bed.) PT Visit Diagnosis: Other abnormalities of gait and mobility (R26.89);Repeated falls (R29.6);Muscle weakness (generalized) (M62.81);History of falling (Z91.81)     Time: 8466-5993 PT Time Calculation (min) (ACUTE ONLY): 25 min  Charges:  $Therapeutic Activity: 8-22 mins                     Governor Rooks, PTA Acute Rehabilitation Services Pager [phone] Office [phone]     Carliss Quast Eli Hose 02/05/2019, 11:57 AM

## 2019-02-05 NOTE — Progress Notes (Signed)
Occupational Therapy Treatment Patient Details Name: Jasmine Carrillo MRN: [id number] DOB: [birth year] Today's Date: 02/05/2019    History of present illness Ms Jasmine Landsberglena Dobis, 78y/f, presented to ED due to concern of recurrent falls. PMH of coronary artery disease status post CABG in 2008, hypertension, dementia, hyperlipidemia, chronic constipation, depression. Pt was recently diagnosed with a talus fracture in right ankle (NWB) and phalanx fracture on left great toe.  Short leg splint to right leg and [facility], advised nonweightbearing for 4 to 6 weeks, she discharged home in a stable condition. On morning of admission pt fell again. Pt was recently diagnosed with Parkinson's Disease.   OT comments  Overall, pt max -total assist +2 for bed mobility, functional transfers and ADLs. Pt requires MAX tactile and verbal cues to complete seated grooming at EOB. Initially pt MOD +2 to maintain sitting balance but able to maintain balance for brief periods during ADLs. Pt with R lateral lean in sitting.   Anterior posterior transfer to recliner with total assist +2. DC plan to SNF remains appropriate. Will continue to follow for acute OT needs.   Follow Up Recommendations  SNF;Supervision/Assistance - 24 hour    Equipment Recommendations  3 in 1 bedside commode    Recommendations for Other Services      Precautions / Restrictions Precautions Precautions: Fall Required Braces or Orthoses: Splint/Cast;Other Brace Splint/Cast: R short leg cast Other Brace: CAM walker to L LE  Restrictions Weight Bearing Restrictions: Yes RLE Weight Bearing: Non weight bearing Other Position/Activity Restrictions: unsure of weight bearing on LLE so treated as NWB.       Mobility Bed Mobility Overal bed mobility: Needs Assistance Bed Mobility: Supine to Sit;Sit to Supine     Supine to sit: Total assist Sit to supine: Total assist   General bed mobility comments: total +2  for all aspects of mobility to move from supine<>sit.  Transfers Overall transfer level: Needs assistance   Transfers: Licensed conveyancerAnterior-Posterior Transfer       Anterior-Posterior transfers: Total assist;+2 physical assistance  Lateral/Scoot Transfers: Max assist;+2 safety/equipment General transfer comment: use of bed pad to scoot posteriorly oob to move from bed to recliner.    Balance Overall balance assessment: Needs assistance Sitting-balance support: Bilateral upper extremity supported;Feet unsupported Sitting balance-Leahy Scale: Fair Sitting balance - Comments: poor initially but as she sat she was able to hold her balance unassisted for brief periods of time during ADLs Postural control: Posterior lean;Right lateral lean     Standing balance comment: NT                           ADL either performed or assessed with clinical judgement   ADL Overall ADL's : Needs assistance/impaired     Grooming: Maximal assistance;Sitting;Wash/dry hands;Wash/dry face;Oral care;Set up Grooming Details (indicate cue type and reason): MAX assist tactile cues to initiate                   Toilet Transfer Details (indicate cue type and reason): deferred         Functional mobility during ADLs: Maximal assistance;Total assistance General ADL Comments: pt limted by weakness, impaired balance ( heavy R lean), and cognition; tactile cues required for familar task     Vision       Perception     Praxis      Cognition Arousal/Alertness: Awake/alert Behavior During Therapy: [facility] for tasks assessed/performed Overall Cognitive  Status: History of cognitive impairments - at baseline Area of Impairment: Orientation;Attention;Memory;Following commands;Safety/judgement;Awareness;Problem solving                 Orientation Level: Disoriented to;Time;Situation Current Attention Level: Focused Memory: Decreased short-term memory;Decreased recall of precautions Following  Commands: Follows one step commands inconsistently;Follows one step commands with increased time Safety/Judgement: Decreased awareness of safety;Decreased awareness of deficits Awareness: Intellectual Problem Solving: Slow processing;Decreased initiation;Difficulty sequencing;Requires verbal cues;Requires tactile cues General Comments: Baseline dementia and oriented to nothing.  Pt responds well to 36s oldies music.  Unable to follow commands with verbal cues required tactile guidance to complete ADLs.        Exercises General Exercises - Lower Extremity Long Arc Quad: AROM;Right;10 reps;Supine   Shoulder Instructions       General Comments      Pertinent Vitals/ Pain       Pain Assessment: Faces Faces Pain Scale: Hurts little more Pain Location: grimacing with movement  Pain Descriptors / Indicators: Grimacing Pain Intervention(s): Monitored during session;Repositioned  Home Living                                          Prior Functioning/Environment              Frequency  Min 2X/week        Progress Toward Goals  OT Goals(current goals can now be found in the care plan section)  Progress towards OT goals: Progressing toward goals  Acute Rehab OT Goals Patient Stated Goal: none stated Time For Goal Achievement: 02/17/19 Potential to Achieve Goals: Fair  Plan      Co-evaluation    PT/OT/SLP Co-Evaluation/Treatment: Yes Reason for Co-Treatment: Complexity of the patient's impairments (multi-system involvement);For patient/therapist safety;Necessary to address cognition/behavior during functional activity;To address functional/ADL transfers PT goals addressed during session: Mobility/safety with mobility OT goals addressed during session: ADL's and self-care      AM-PAC OT "6 Clicks" Daily Activity     Outcome Measure   Help from another person eating meals?: A Lot Help from another person taking care of personal grooming?: A  Lot Help from another person toileting, which includes using toliet, bedpan, or urinal?: Total Help from another person bathing (including washing, rinsing, drying)?: Total Help from another person to put on and taking off regular upper body clothing?: A Lot Help from another person to put on and taking off regular lower body clothing?: Total 6 Click Score: 9    End of Session    OT Visit Diagnosis: Other abnormalities of gait and mobility (R26.89);Pain;Other symptoms and signs involving cognitive function;Muscle weakness (generalized) (M62.81);Repeated falls (R29.6)   Activity Tolerance Other (comment);Patient limited by pain(limited by cognitive deficits)   Patient Left with call bell/phone within reach;in chair;with chair alarm set   Nurse Communication Mobility status(tech aware pt needed new purewic)        Time: 7793-9030 OT Time Calculation (min): 23 min  Charges: OT General Charges $OT Visit: 1 Visit OT Treatments $Self Care/Home Management : 8-22 mins     Aileen Pilot, [place] [phone]

## 2019-02-05 NOTE — Plan of Care (Signed)
  Problem: Education: Goal: Knowledge of General Education information will improve Description Including pain rating scale, medication(s)/side effects and non-pharmacologic comfort measures Outcome: Progressing   Problem: Health Behavior/Discharge Planning: Goal: Ability to manage health-related needs will improve Outcome: Progressing   

## 2019-02-06 MED ORDER — HYDRALAZINE HCL 20 MG/ML IJ SOLN: 10.0000 mg | Freq: Four times a day (QID) | INTRAMUSCULAR | Status: DC | PRN

## 2019-02-06 NOTE — Progress Notes (Signed)
PROGRESS NOTE    Jasmine Carrillo  ZOX:[id number] DOB: January 10, 1941 DOA: 02/01/2019 PCP: Laurann MontanaWhite, Cynthia, MD  Brief Narrative: 78 year old female with history of CAD, CABG in 2008, dementia-possible Parkinson's, dyslipidemia, chronic constipation, depression was brought to the ER by her daughter due to recurrent falls, -She was seen in the ER originally on 9/3, after 3 falls, diagnosed with right ankle, talus fracture short-leg splint placed, postop boot and then went home, recommended nonweightbearing and orthopedics follow-up after she went back home she fell again and subsequently brought back to the ER and was admitted   -Awaiting possible transfer to SNF rehab  Assessment & Plan:   Recurrent falls, Gait disorder -Suspect this may be secondary to advanced age, dementia and movement disorder  -A.m. cortisol above 18 -Gait disorder has been an ongoing issue, [facility] -Doubt polypharmacy is contributing, no new medications , unfortunately unable to check orthostatics given nonweightbearing status -PT OT evaluation --Social work and case management consult  Right ankle/Talus fracture and left great toe phalanx fracture: Right talus fx -- Plan NWB in splint with likely cast conversion in office in another week or so. She should see Dr. Ave Filterhandler at discharge. Left great toe phalanx fx -- She may be WBAT in post-op shoe. However, if the CAM walker is more comfortable then can wear that instead. 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  She was started on Depakote for myoclonic jerking in her arms.  Hyperlipidemia - continue simvastatin  Depression: -Hold Lexapro in setting of myoclonic jerks  Hypertension -: Blood pressure is slightly elevated -Continue carvedilol  Chronic constipation: -Improved with MiraLAX  DVT prophylaxis: Lovenox Code Status:  DNR,  Family Communication:  Discussed with daughter by [phone] Disposition Plan:  Awaiting skilled nursing facility placement    Procedures:   Antimicrobials:    Subjective: - Resting comfortably, mumbling ,not really following commands  Objective: Vitals:   02/06/19 0410 02/06/19 0823 02/06/19 1534 02/06/19 1549  BP: (!) 163/78 (!) 150/80 (!) 178/81 (!) 165/81  Pulse: 73 84 83 83  Resp: 17 17 17    Temp: 98.6 F (37 C) 98.4 F (36.9 C) 98.4 F (36.9 C)   TempSrc:  Oral Oral   SpO2: 98% 95% 97%     Intake/Output Summary (Last 24 hours) at 02/06/2019 1840 Last data filed at 02/06/2019 1300 Gross per 24 hour  Intake 240 ml  Output 600 ml  Net -360 ml   There were no vitals filed for this visit.  Examination:  General exam: Alert, awake, no distress Respiratory system: Clear to auscultation. Respiratory effort normal. Cardiovascular system:RRR. CABG scar Gastrointestinal system: Abdomen is nondistended, soft and nontender  Normal bowel sounds heard. Central nervous system: Confused.   Limited neuro exam as patient is not able to follow commands patient moves spontaneously Extremities: Both lower extremities are in brace/dressings. Skin: No rashes, lesions or ulcers Psychiatry: Cognitive and memory deficits noted  Data Reviewed:   CBC: Recent Labs  Lab 02/01/19 1533 02/01/19 1733 02/02/19 0432 02/03/19 1634 02/04/19 0420  WBC 5.3 5.9 5.8  6.2 6.2  NEUTROABS 3.6  --   --   --   --   HGB 13.3 12.7 12.7 13.8 12.7  HCT 41.6 40.8 39.1 42.6 38.9  MCV 95.9 97.6 94.9 94.2 93.5  PLT 218 222 208 254 225   Basic  Metabolic Panel: Recent Labs  Lab 02/01/19 1533 02/01/19 1733 02/02/19 0432 02/03/19 1634 02/04/19 0420  NA 141  --  140 138 139  K 4.0  --  3.4* 3.7 3.4*  CL 108  --  107 104 107  CO2 25  --  23 20* 25  GLUCOSE 91  --  98 109* 101*  BUN 15  --  13 15 17   CREATININE 0.79 0.68 0.60 0.64 0.60  CALCIUM 8.8*  --  8.6* 9.0 8.5*  MG  --  2.2  --   --   --   PHOS  --  3.3  --   --   --    GFR: CrCl cannot be calculated (Unknown ideal weight.). Liver Function Tests: Recent Labs  Lab 02/02/19 0432  AST 19  ALT 14  ALKPHOS 57  BILITOT 0.8  PROT 5.9*  ALBUMIN 3.3*   No results for input(s): LIPASE, AMYLASE in the last 168 hours. Recent Labs  Lab 02/03/19 1634  AMMONIA 21   Coagulation Profile: No results for input(s): INR, PROTIME in the last 168 hours. Cardiac Enzymes: No results for input(s): CKTOTAL, CKMB, CKMBINDEX, TROPONINI in the last 168 hours. BNP (last 3 results) No results for input(s): PROBNP in the last 8760 hours. HbA1C: No results for input(s): HGBA1C in the last 72 hours. CBG: Recent Labs  Lab 02/04/19 2105 02/05/19 0656  GLUCAP 117* 96   Lipid Profile: No results for input(s): CHOL, HDL, LDLCALC, TRIG, CHOLHDL, LDLDIRECT in the last 72 hours. Thyroid Function Tests: No results for input(s): TSH, T4TOTAL, FREET4, T3FREE, THYROIDAB in the last 72 hours. Anemia Panel: No results for input(s): VITAMINB12, FOLATE, FERRITIN, TIBC, IRON, RETICCTPCT in the last 72 hours. Urine analysis:    Component Value Date/Time   COLORURINE YELLOW 02/01/2019 1615   APPEARANCEUR CLEAR 02/01/2019 1615   LABSPEC 1.016 02/01/2019 1615   PHURINE 5.0 02/01/2019 1615   GLUCOSEU NEGATIVE 02/01/2019 1615   HGBUR NEGATIVE 02/01/2019 1615   BILIRUBINUR NEGATIVE 02/01/2019 1615   KETONESUR NEGATIVE 02/01/2019 1615   PROTEINUR NEGATIVE 02/01/2019 1615   NITRITE NEGATIVE 02/01/2019 1615   LEUKOCYTESUR NEGATIVE 02/01/2019 1615   Sepsis Labs:  @[email or web address] (procalcitonin:4,lacticidven:4)  ) Recent Results (from the past 240 hour(s))  SARS Coronavirus 2 [facility] order, Performed in [facility] hospital lab) Nasopharyngeal Nasopharyngeal Swab     Status: None   Collection Time: 02/01/19  4:36 PM   Specimen: Nasopharyngeal Swab  Result Value Ref Range Status   SARS Coronavirus 2 NEGATIVE NEGATIVE Final    Comment: (NOTE) If result is NEGATIVE SARS-CoV-2 target nucleic acids are NOT DETECTED. The SARS-CoV-2 RNA is generally detectable in upper and lower  respiratory specimens during the acute phase of infection. The lowest  concentration of SARS-CoV-2 viral copies this assay can detect is 250  copies / mL. A negative result does not preclude SARS-CoV-2 infection  and should not be used as the sole basis for treatment or other  patient management decisions.  A negative result may occur with  improper specimen collection / handling, submission of specimen other  than nasopharyngeal swab, presence of viral mutation(s) within the  areas targeted by this assay, and inadequate number of viral copies  (<  250 copies / mL). A negative result must be combined with clinical  observations, patient history, and epidemiological information. If result is POSITIVE SARS-CoV-2 target nucleic acids are DETECTED. The SARS-CoV-2 RNA is generally detectable in upper and lower  respiratory specimens dur ing the acute phase of infection.  Positive  results are indicative of active infection with SARS-CoV-2.  Clinical  correlation with patient history and other diagnostic information is  necessary to determine patient infection status.  Positive results do  not rule out bacterial infection or co-infection with other viruses. If result is PRESUMPTIVE POSTIVE SARS-CoV-2 nucleic acids MAY BE PRESENT.   A presumptive positive result was obtained on the submitted specimen  and confirmed on repeat testing.  While 2019 novel coronavirus  (SARS-CoV-2)  nucleic acids may be present in the submitted sample  additional confirmatory testing may be necessary for epidemiological  and / or clinical management purposes  to differentiate between  SARS-CoV-2 and other Sarbecovirus currently known to infect humans.  If clinically indicated additional testing with an alternate test  methodology [phone]) is advised. The SARS-CoV-2 RNA is generally  detectable in upper and lower respiratory sp ecimens during the acute  phase of infection. The expected result is Negative. Fact Sheet for Patients:  StrictlyIdeas.no Fact Sheet for Healthcare Providers: BankingDealers.co.za This test is not yet approved or cleared by the [place] FDA and has been authorized for detection and/or diagnosis of SARS-CoV-2 by FDA under an Emergency Use Authorization (EUA).  This EUA will remain in effect (meaning this test can be used) for the duration of the COVID-19 declaration under Section 564(b)(1) of the Act, 21 U.S.C. section 360bbb-3(b)(1), unless the authorization is terminated or revoked sooner. Performed at [facility], [place] [address], [place], [place] [address]     Radiology Studies: No results found.  Scheduled Meds: . aspirin  81 mg Oral Daily  . carvedilol  6.25 mg Oral BID  . divalproex  250 mg Oral Daily  . donepezil  10 mg Oral QPM  . enoxaparin (LOVENOX) injection  40 mg Subcutaneous Q24H  . feeding supplement (ENSURE ENLIVE)  237 mL Oral BID BM  . mouth rinse  15 mL Mouth Rinse q12n4p  . memantine  10 mg Oral BID  . polyethylene glycol  17 g Oral Daily  . senna-docusate  1 tablet Oral BID  . simvastatin  40 mg Oral QPM   Continuous Infusions:    LOS: 4 days   [facility] Page via [email or web address] After 7PM please contact night-coverage  02/06/2019, 6:40 PM

## 2019-02-06 NOTE — TOC Progression Note (Signed)
Transition of Care [facility]) - Progression Note    Patient Details  Name: Jasmine Carrillo MRN: [id number] Date of Birth: 11-04-1940  Transition [facility]) CM/SW Contact  Sharlet Salina Mila Homer, LCSW Phone Number: 02/06/2019, 6:49 PM  Clinical Narrative:   CSW talked with daughter [name] by phone regarding patient discharge disposition. Daughter provided with facility responses (bed offers). Per daughter, her mother has not been to a skilled nursing facility before. Daughter explained that her mother lives with her and she has helped take care of her for years, but her mother has had a decline in her functioning and she needs extra assistance at this time. Per daughter, her mother is non-wight bearing for 6 weeks. The non-weight bearing status discussed in context of her going to a facility and participating/getting benefit from [place] rehab.   CSW received a call from John Giovanni with PASRR regarding the Level 2 PASRR notification. CSW provided answers to Ms. Bland's questions and she indicted that this information will be sent to [facility] MUST (PASRR).     Expected Discharge Plan: Skilled Nursing Facility Barriers to Discharge: Ship broker, [place] (PASRR), Continued Medical Work up  Expected Discharge Plan and Services Expected Discharge Plan: [place] In-house Referral: Clinical Social Work Discharge Planning Services: CM Consult Post Acute Care Choice: [place] arrangements for the past 2 months: Single Family Home                                      Social Determinants of Health (SDOH) Interventions  No SDOH interventions needed at this time  Readmission Risk Interventions No flowsheet data found.

## 2019-02-06 NOTE — Plan of Care (Signed)
  Problem: Safety: Goal: Ability to remain free from injury will improve Outcome: Progressing   Problem: Nutrition: Goal: Adequate nutrition will be maintained Outcome: Progressing   

## 2019-02-06 NOTE — Care Management Important Message (Signed)
Important Message  Patient Details  Name: Jasmine Carrillo MRN: [id number] Date of Birth: Aug 03, 1940   Medicare Important Message Given:  Yes     Memory Argue 02/06/2019, 4:07 PM

## 2019-02-07 DIAGNOSIS — E785 Hyperlipidemia, unspecified: Secondary | ICD-10-CM | POA: Diagnosis present

## 2019-02-07 DIAGNOSIS — R296 Repeated falls: Principal | ICD-10-CM

## 2019-02-07 DIAGNOSIS — S82891A Other fracture of right lower leg, initial encounter for closed fracture: Secondary | ICD-10-CM

## 2019-02-07 DIAGNOSIS — S92492A Other fracture of left great toe, initial encounter for closed fracture: Secondary | ICD-10-CM

## 2019-02-07 DIAGNOSIS — I10 Essential (primary) hypertension: Secondary | ICD-10-CM | POA: Diagnosis present

## 2019-02-07 DIAGNOSIS — F0390 Unspecified dementia without behavioral disturbance: Secondary | ICD-10-CM | POA: Diagnosis present

## 2019-02-07 LAB — BASIC METABOLIC PANEL
Anion gap: 9 (ref 5–15)
BUN: 18 mg/dL (ref 8–23)
CO2: 25 mmol/L (ref 22–32)
Calcium: 8.6 mg/dL — ABNORMAL LOW (ref 8.9–10.3)
Chloride: 106 mmol/L (ref 98–111)
Creatinine, Ser: 0.49 mg/dL (ref 0.44–1.00)
GFR calc Af Amer: >60 mL/min (ref >60)
GFR calc non Af Amer: >60 mL/min (ref >60)
Glucose, Bld: 116 mg/dL — ABNORMAL HIGH (ref 70–99)
Potassium: 3.2 mmol/L — ABNORMAL LOW (ref 3.5–5.1)
Sodium: 140 mmol/L (ref 135–145)

## 2019-02-07 LAB — SARS CORONAVIRUS 2 (TAT 6-24 HRS): SARS Coronavirus 2: NEGATIVE

## 2019-02-07 MED ORDER — POTASSIUM CHLORIDE CRYS ER 20 MEQ PO TBCR
40.0000 meq | EXTENDED_RELEASE_TABLET | ORAL | Status: AC
  Administered 2019-02-07 (×2): 40 meq via ORAL
  Filled 2019-02-07 (×2): qty 2

## 2019-02-07 NOTE — Progress Notes (Signed)
Initial Nutrition Assessment   RD working remotely.  DOCUMENTATION CODES:   Not applicable  INTERVENTION:  Continue Ensure Enlive po BID, each supplement provides 350 kcal and 20 grams of protein.  Encourage adequate PO intake.   NUTRITION DIAGNOSIS:   Increased nutrient needs related to acute illness as evidenced by estimated needs.  GOAL:   Patient will meet greater than or equal to 90% of their needs  MONITOR:   PO intake, Supplement acceptance, Skin, Weight trends, Labs, I & O's  REASON FOR ASSESSMENT:   Malnutrition Screening Tool    ASSESSMENT:   78 year old female with history of CAD, CABG in 2008, dementia-possible Parkinson's, dyslipidemia, chronic constipation, depression was brought to the ER by her daughter due to recurrent falls, Pt with right ankle/Talus fracture and left great toe phalanx fracture.  RD unable to obtain pt nutrition history at this time. Pt with dementia and disoriented. Meal completion 50%. Pt currently has Ensure ordered and has been consuming them. RD to continue with current orders to aid in caloric and protein needs. Pt awaiting SNF placement.   Unable to complete Nutrition-Focused physical exam at this time.   Labs and medications reviewed.   Diet Order:   Diet Order            Diet regular Room service appropriate? Yes with Assist; Fluid consistency: Thin  Diet effective now              EDUCATION NEEDS:   Not appropriate for education at this time  Skin:  Skin Assessment: Reviewed RN Assessment  Last BM:  9/9  Height:   Ht Readings from Last 1 Encounters:  08/08/18 5\' 2"  (1.575 m)    Weight:   Wt Readings from Last 1 Encounters:  08/08/18 61.7 kg    Ideal Body Weight:  50 kg  BMI:  There is no height or weight on file to calculate BMI.  Estimated Nutritional Needs:   Kcal:  1550-1750  Protein:  75-85 grams  Fluid:  >/= 1.5 L/day    Corrin Parker, MS, RD, LDN Pager # [phone] After hours/  weekend pager # [phone]

## 2019-02-07 NOTE — Progress Notes (Addendum)
PROGRESS NOTE    Jasmine Carrillo  KTG:[id number] DOB: [birth year] DOA: 02/01/2019 PCP: Laurann Montana, MD  Brief Narrative: 78 year old female with history of CAD, CABG in 2008, dementia-possible Parkinson's, dyslipidemia, chronic constipation, depression was brought to the ER by her daughter due to recurrent falls, -She was seen in the ER originally on 9/3, after 3 falls, diagnosed with right ankle, talus fracture short-leg splint placed, postop boot and then went home, recommended nonweightbearing and orthopedics follow-up after she went back home she fell again and subsequently brought back to the ER and was admitted   -Awaiting possible transfer to SNF rehab  Assessment & Plan:   Recurrent falls, Gait disorder -Likely secondary to advanced age, dementia and movement disorder  -A.m. cortisol above 18 which is normal. -Gait disorder has been an ongoing issue, seen by Dr. Anne Hahn with [facility] neurology for this -Doubt polypharmacy is contributing, no new medications , unfortunately unable to check orthostatics given nonweightbearing status -PT OT evaluation recommends SNF placement. --Social work and case management consult  Right ankle/Talus fracture and left great toe phalanx fracture: Right talus fx -- Plan NWB in splint with likely cast conversion in office in another week or so. She should see Dr. Ave Filter at discharge. Left great toe phalanx fx -- She may be WBAT in post-op shoe. However, if the CAM walker is more comfortable then can wear that instead. 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  She was started on Depakote for myoclonic jerking in her arms.  Hyperlipidemia - continue simvastatin  Depression: -Hold Lexapro in setting of myoclonic jerks  Hypertension Blood pressure fairly controlled.  Continue carvedilol.  Chronic constipation: -Improved with MiraLAX  DVT prophylaxis: Lovenox Code Status:  DNR,  Family Communication:  None present at bedside. Disposition Plan:  Awaiting skilled nursing facility placement    Procedures:   Antimicrobials:    Subjective: Patient seen and examined.  She is comfortable but she is disoriented.  She is pleasant.  Objective: Vitals:   02/06/19 1947 02/07/19 0640 02/07/19 0756 02/07/19 1346  BP: 139/80 (!) 159/82 (!) 147/89 112/71  Pulse: 94 89 91 87  Resp: 14 16 16 16   Temp: 97.6 F (36.4 C) 98.2 F (36.8 C) 97.8 F (36.6 C) 97.6 F (36.4 C)  TempSrc: Oral Oral Oral Oral  SpO2: 96% 95% 95% 96%    Intake/Output Summary (Last 24 hours) at 02/07/2019 1416 Last data filed at 02/07/2019 1300 Gross per 24 hour  Intake 480 ml  Output 950 ml  Net -470 ml   There were no vitals filed for this visit.  Examination:  General exam: Appears calm and comfortable  Respiratory system: Clear to auscultation. Respiratory effort normal. Cardiovascular system: S1 & S2 heard, RRR. No JVD, murmurs, rubs, gallops or clicks. No pedal edema. Gastrointestinal system: Abdomen is nondistended, soft and nontender. No organomegaly or masses felt. Normal bowel sounds heard. Central nervous system: Alert and oriented x0. No focal neurological deficits. Extremities: Symmetric 5 x 5 power. Skin: No rashes, lesions or ulcers.  Psychiatry: Judgement and insight appear poor, mood & affect flat.  Seems  to have memory issues.  Data Reviewed:   CBC: Recent Labs  Lab 02/01/19 1533 02/01/19 1733 02/02/19 0432 02/03/19 1634 02/04/19 0420  WBC 5.3 5.9 5.8 6.2 6.2  NEUTROABS 3.6  --   --   --   --    HGB 13.3 12.7 12.7 13.8 12.7  HCT 41.6 40.8 39.1 42.6 38.9  MCV 95.9 97.6 94.9 94.2 93.5  PLT 218 222 208 254 287   Basic Metabolic Panel: Recent Labs  Lab 02/01/19 1533 02/01/19 1733 02/02/19 0432 02/03/19 1634 02/04/19 0420 02/07/19 0353  NA 141  --  140 138 139 140  K 4.0  --  3.4* 3.7 3.4* 3.2*  CL 108  --  107 104 107 106  CO2 25  --  23 20* 25 25  GLUCOSE 91  --  98 109* 101* 116*  BUN 15  --  13 15 17 18   CREATININE 0.79 0.68 0.60 0.64 0.60 0.49  CALCIUM 8.8*  --  8.6* 9.0 8.5* 8.6*  MG  --  2.2  --   --   --   --   PHOS  --  3.3  --   --   --   --    GFR: CrCl cannot be calculated (Unknown ideal weight.). Liver Function Tests: Recent Labs  Lab 02/02/19 0432  AST 19  ALT 14  ALKPHOS 57  BILITOT 0.8  PROT 5.9*  ALBUMIN 3.3*   No results for input(s): LIPASE, AMYLASE in the last 168 hours. Recent Labs  Lab 02/03/19 1634  AMMONIA 21   Coagulation Profile: No results for input(s): INR, PROTIME in the last 168 hours. Cardiac Enzymes: No results for input(s): CKTOTAL, CKMB, CKMBINDEX, TROPONINI in the last 168 hours. BNP (last 3 results) No results for input(s): PROBNP in the last 8760 hours. HbA1C: No results for input(s): HGBA1C in the last 72 hours. CBG: Recent Labs  Lab 02/04/19 2105 02/05/19 0656  GLUCAP 117* 96   Lipid Profile: No results for input(s): CHOL, HDL, LDLCALC, TRIG, CHOLHDL, LDLDIRECT in the last 72 hours. Thyroid Function Tests: No results for input(s): TSH, T4TOTAL, FREET4, T3FREE, THYROIDAB in the last 72 hours. Anemia Panel: No results for input(s): VITAMINB12, FOLATE, FERRITIN, TIBC, IRON, RETICCTPCT in the last 72 hours. Urine analysis:    Component Value Date/Time   COLORURINE YELLOW 02/01/2019 [place] 02/01/2019 1615   LABSPEC 1.016 02/01/2019 1615   PHURINE 5.0 02/01/2019 1615   GLUCOSEU NEGATIVE 02/01/2019 1615   HGBUR NEGATIVE 02/01/2019 1615   BILIRUBINUR NEGATIVE 02/01/2019 1615   KETONESUR  NEGATIVE 02/01/2019 1615   PROTEINUR NEGATIVE 02/01/2019 1615   NITRITE NEGATIVE 02/01/2019 1615   LEUKOCYTESUR NEGATIVE 02/01/2019 1615   Sepsis Labs: @LABRCNTIP (procalcitonin:4,lacticidven:4)  ) Recent Results (from the past 240 hour(s))  SARS Coronavirus 2 [facility] order, Performed in [facility] hospital lab) Nasopharyngeal Nasopharyngeal Swab     Status: None   Collection Time: 02/01/19  4:36 PM   Specimen: Nasopharyngeal Swab  Result Value Ref Range Status   SARS Coronavirus 2 NEGATIVE NEGATIVE Final    Comment: (NOTE) If result is NEGATIVE SARS-CoV-2 target nucleic acids are NOT DETECTED. The SARS-CoV-2 RNA is generally detectable in upper and lower  respiratory specimens during the acute phase of infection. The lowest  concentration of SARS-CoV-2 viral copies this assay can detect is 250  copies / mL. A negative result does not preclude SARS-CoV-2 infection  and should not be used as the sole basis for treatment  or other  patient management decisions.  A negative result may occur with  improper specimen collection / handling, submission of specimen other  than nasopharyngeal swab, presence of viral mutation(s) within the  areas targeted by this assay, and inadequate number of viral copies  (<250 copies / mL). A negative result must be combined with clinical  observations, patient history, and epidemiological information. If result is POSITIVE SARS-CoV-2 target nucleic acids are DETECTED. The SARS-CoV-2 RNA is generally detectable in upper and lower  respiratory specimens dur ing the acute phase of infection.  Positive  results are indicative of active infection with SARS-CoV-2.  Clinical  correlation with patient history and other diagnostic information is  necessary to determine patient infection status.  Positive results do  not rule out bacterial infection or co-infection with other viruses. If result is PRESUMPTIVE POSTIVE SARS-CoV-2 nucleic acids MAY BE PRESENT.    A presumptive positive result was obtained on the submitted specimen  and confirmed on repeat testing.  While 2019 novel coronavirus  (SARS-CoV-2) nucleic acids may be present in the submitted sample  additional confirmatory testing may be necessary for epidemiological  and / or clinical management purposes  to differentiate between  SARS-CoV-2 and other Sarbecovirus currently known to infect humans.  If clinically indicated additional testing with an alternate test  methodology [id number]) is advised. The SARS-CoV-2 RNA is generally  detectable in upper and lower respiratory sp ecimens during the acute  phase of infection. The expected result is Negative. Fact Sheet for Patients:  BoilerBrush.com.cyhttps://www.fda.gov/media/136312/download Fact Sheet for Healthcare Providers: https://pope.com/https://www.fda.gov/media/136313/download This test is not yet approved or cleared by the [place] FDA and has been authorized for detection and/or diagnosis of SARS-CoV-2 by FDA under an Emergency Use Authorization (EUA).  This EUA will remain in effect (meaning this test can be used) for the duration of the COVID-19 declaration under Section 564(b)(1) of the Act, 21 U.S.C. section 360bbb-3(b)(1), unless the authorization is terminated or revoked sooner. Performed at [facility], [address] [place], [place] [address]     Radiology Studies: No results found.  Scheduled Meds: . aspirin  81 mg Oral Daily  . carvedilol  6.25 mg Oral BID  . divalproex  250 mg Oral Daily  . donepezil  10 mg Oral QPM  . enoxaparin (LOVENOX) injection  40 mg Subcutaneous Q24H  . feeding supplement (ENSURE ENLIVE)  237 mL Oral BID BM  . mouth rinse  15 mL Mouth Rinse q12n4p  . memantine  10 mg Oral BID  . polyethylene glycol  17 g Oral Daily  . potassium chloride  40 mEq Oral Q4H  . senna-docusate  1 tablet Oral BID  . simvastatin  40 mg Oral QPM   Continuous Infusions:    LOS: 5 days   Hughie Clossavi Marysol Wellnitz, MD [facility]  Page via [email or web address] After 7PM please contact night-coverage  02/07/2019, 2:16 PM

## 2019-02-07 NOTE — Progress Notes (Signed)
Physical Therapy Treatment Patient Details Name: Jasmine Carrillo MRN: [id number] DOB: 02-01-1941 Today's Date: 02/07/2019    History of Present Illness Ms Jasmine Carrillo, 78y/f, presented to ED due to concern of recurrent falls. PMH of coronary artery disease status post CABG in 2008, hypertension, dementia, hyperlipidemia, chronic constipation, depression. Pt was recently diagnosed with a talus fracture in right ankle (NWB) and phalanx fracture on left great toe.  Short leg splint to right leg and postop boot applied to left leg at [facility] long, advised nonweightbearing for 4 to 6 weeks, she discharged home in a stable condition. On morning of admission pt fell again. Pt was recently diagnosed with Parkinson's Disease.    PT Comments    Pt performed seated balance training and transfer training in bed, to edge of bed and from bed to recliner.  She remains to require total assistance for all aspects of mobility.  Little to no progress supervising PT to follow up next session.     Follow Up Recommendations  SNF;Supervision/Assistance - 24 hour;Other (comment)     Equipment Recommendations  Other (comment)(TBD)    Recommendations for Other Services       Precautions / Restrictions Precautions Precautions: Fall Required Braces or Orthoses: Splint/Cast;Other Brace Splint/Cast: R short leg cast Other Brace: CAM walker to L LE  Restrictions Weight Bearing Restrictions: Yes RLE Weight Bearing: Non weight bearing LLE Weight Bearing: Weight bearing as tolerated(in CAM walker or post op shoe)    Mobility  Bed Mobility Overal bed mobility: Needs Assistance Bed Mobility: Rolling Rolling: Max assist;+2 for physical assistance   Supine to sit: Total assist     General bed mobility comments: total +1/+2 remains for rolling and supine to sit.  Once seated edge of bed presents with posterior and L lateral lean.  Performed sitting balance with moderate assist to correct  balance.  Transfers Overall transfer level: Needs assistance Equipment used: None Transfers: Squat Pivot Transfers     Squat pivot transfers: Total assist     General transfer comment: total assist to pivot from bed to recliner toward LLE to maintain to weight bearing precautions.  Ambulation/Gait                 Stairs             Wheelchair Mobility    Modified Rankin (Stroke Patients Only)       Balance     Sitting balance-Leahy Scale: Poor   Postural control: Posterior lean;Left lateral lean   Standing balance-Leahy Scale: Zero                              Cognition Arousal/Alertness: Awake/alert Behavior During Therapy: WFL for tasks assessed/performed Overall Cognitive Status: History of cognitive impairments - at baseline                                        Exercises General Exercises - Lower Extremity Long Arc Quad: 10 reps;Supine;AAROM;Both    General Comments        Pertinent Vitals/Pain Pain Assessment: Faces Faces Pain Scale: Hurts little more Pain Location: grimacing with movement  Pain Descriptors / Indicators: Grimacing Pain Intervention(s): Monitored during session;Repositioned    Home Living                      Prior  Function            PT Goals (current goals can now be found in the care plan section) Acute Rehab PT Goals Potential to Achieve Goals: Poor Progress towards PT goals: PT to reassess next treatment(not making progress)    Frequency    Min 2X/week      PT Plan Current plan remains appropriate    Co-evaluation              AM-PAC PT "6 Clicks" Mobility   Outcome Measure  Help needed turning from your back to your side while in a flat bed without using bedrails?: Total Help needed moving from lying on your back to sitting on the side of a flat bed without using bedrails?: Total Help needed moving to and from a bed to a chair (including a  wheelchair)?: Total Help needed standing up from a chair using your arms (e.g., wheelchair or bedside chair)?: Total Help needed to walk in hospital room?: Total Help needed climbing 3-5 steps with a railing? : Total 6 Click Score: 6    End of Session Equipment Utilized During Treatment: Gait belt Activity Tolerance: Patient limited by pain Patient left: in bed;with call bell/phone within reach;with bed alarm set;Other (comment) Nurse Communication: Mobility status;Other (comment) PT Visit Diagnosis: Other abnormalities of gait and mobility (R26.89);Repeated falls (R29.6);Muscle weakness (generalized) (M62.81);History of falling (Z91.81)     Time: [id number] PT Time Calculation (min) (ACUTE ONLY): 26 min  Charges:  $Therapeutic Activity: 23-37 mins                     Joycelyn RuaAimee Chistine Dematteo, PTA Acute Rehabilitation Services Pager [phone] Office [phone]     Oakes Mccready Artis DelayJ Yassine Brunsman 02/07/2019, 6:05 PM

## 2019-02-07 NOTE — Plan of Care (Signed)
  Problem: Elimination: Goal: Will not experience complications related to bowel motility Outcome: Progressing   Problem: Clinical Measurements: Goal: Ability to maintain clinical measurements within normal limits will improve Outcome: Progressing   Problem: Safety: Goal: Ability to remain free from injury will improve Outcome: Progressing

## 2019-02-07 NOTE — TOC Progression Note (Signed)
Transition of Care (TOC) - Progression Note  *Discharge to [facility] once COVID test results   Patient Details  Name: Jasmine Carrillo MRN: [id number] Date of Birth: 05/11/1941  Transition of [facility]) CM/SW Contact  Jasmine Salina Mila Homer, LCSW Phone Number: 02/07/2019, 7:52 PM  Clinical Narrative:  Talked with daughter, Jasmine Carrillo ([phone]) and [place] requested. Talked with Irine Seal, admissions director and they can take patient and Carrillo need COVID test results by 4 pm to take her today. CSW checked after 7 pm and COVID results still pending. Talked with daughter (7:55 pm) and informed her that discharge Carrillo be Friday to [place], once COVID results are in.    Patient has PASRR number [id number] H eff. 9/10 with no expiration date.      Expected Discharge Plan: Skilled Nursing Facility Barriers to Discharge: Ship broker, [place] (PASRR), Continued Medical Work up  Expected Discharge Plan and Services Expected Discharge Plan: [place] In-house Referral: Clinical Social Work Discharge Planning Services: CM Consult Post Acute Care Choice: [place] arrangements for the past 2 months: Single Family Home                                 Social Determinants of Health (SDOH) Interventions    Readmission Risk Interventions No flowsheet data found.

## 2019-02-07 NOTE — Plan of Care (Signed)

## 2019-02-08 DIAGNOSIS — G8929 Other chronic pain: Secondary | ICD-10-CM | POA: Diagnosis not present

## 2019-02-08 DIAGNOSIS — M6281 Muscle weakness (generalized): Secondary | ICD-10-CM | POA: Diagnosis not present

## 2019-02-08 DIAGNOSIS — S82891A Other fracture of right lower leg, initial encounter for closed fracture: Secondary | ICD-10-CM

## 2019-02-08 DIAGNOSIS — R627 Adult failure to thrive: Secondary | ICD-10-CM | POA: Diagnosis not present

## 2019-02-08 DIAGNOSIS — R41841 Cognitive communication deficit: Secondary | ICD-10-CM | POA: Diagnosis not present

## 2019-02-08 DIAGNOSIS — S92415A Nondisplaced fracture of proximal phalanx of left great toe, initial encounter for closed fracture: Secondary | ICD-10-CM | POA: Diagnosis not present

## 2019-02-08 DIAGNOSIS — S92154A Nondisplaced avulsion fracture (chip fracture) of right talus, initial encounter for closed fracture: Secondary | ICD-10-CM | POA: Diagnosis not present

## 2019-02-08 DIAGNOSIS — R4189 Other symptoms and signs involving cognitive functions and awareness: Secondary | ICD-10-CM | POA: Diagnosis not present

## 2019-02-08 DIAGNOSIS — G20 Parkinson's disease: Secondary | ICD-10-CM | POA: Diagnosis not present

## 2019-02-08 DIAGNOSIS — R296 Repeated falls: Secondary | ICD-10-CM | POA: Diagnosis not present

## 2019-02-08 DIAGNOSIS — F0280 Dementia in other diseases classified elsewhere without behavioral disturbance: Secondary | ICD-10-CM | POA: Diagnosis not present

## 2019-02-08 DIAGNOSIS — R300 Dysuria: Secondary | ICD-10-CM | POA: Diagnosis not present

## 2019-02-08 DIAGNOSIS — S82891D Other fracture of right lower leg, subsequent encounter for closed fracture with routine healing: Secondary | ICD-10-CM | POA: Diagnosis not present

## 2019-02-08 DIAGNOSIS — R1312 Dysphagia, oropharyngeal phase: Secondary | ICD-10-CM | POA: Diagnosis not present

## 2019-02-08 DIAGNOSIS — Z789 Other specified health status: Secondary | ICD-10-CM | POA: Diagnosis not present

## 2019-02-08 DIAGNOSIS — J410 Simple chronic bronchitis: Secondary | ICD-10-CM | POA: Diagnosis not present

## 2019-02-08 DIAGNOSIS — Z7401 Bed confinement status: Secondary | ICD-10-CM | POA: Diagnosis not present

## 2019-02-08 DIAGNOSIS — S92919A Unspecified fracture of unspecified toe(s), initial encounter for closed fracture: Secondary | ICD-10-CM | POA: Diagnosis not present

## 2019-02-08 DIAGNOSIS — R278 Other lack of coordination: Secondary | ICD-10-CM | POA: Diagnosis not present

## 2019-02-08 DIAGNOSIS — R5381 Other malaise: Secondary | ICD-10-CM | POA: Diagnosis not present

## 2019-02-08 DIAGNOSIS — R2689 Other abnormalities of gait and mobility: Secondary | ICD-10-CM | POA: Diagnosis not present

## 2019-02-08 DIAGNOSIS — E860 Dehydration: Secondary | ICD-10-CM | POA: Diagnosis not present

## 2019-02-08 DIAGNOSIS — I10 Essential (primary) hypertension: Secondary | ICD-10-CM | POA: Diagnosis not present

## 2019-02-08 DIAGNOSIS — S82891S Other fracture of right lower leg, sequela: Secondary | ICD-10-CM | POA: Diagnosis not present

## 2019-02-08 DIAGNOSIS — Z23 Encounter for immunization: Secondary | ICD-10-CM | POA: Diagnosis not present

## 2019-02-08 DIAGNOSIS — R2681 Unsteadiness on feet: Secondary | ICD-10-CM | POA: Diagnosis not present

## 2019-02-08 DIAGNOSIS — M2550 Pain in unspecified joint: Secondary | ICD-10-CM | POA: Diagnosis not present

## 2019-02-08 DIAGNOSIS — E870 Hyperosmolality and hypernatremia: Secondary | ICD-10-CM | POA: Diagnosis not present

## 2019-02-08 DIAGNOSIS — S92492S Other fracture of left great toe, sequela: Secondary | ICD-10-CM | POA: Diagnosis not present

## 2019-02-08 DIAGNOSIS — R05 Cough: Secondary | ICD-10-CM | POA: Diagnosis not present

## 2019-02-08 DIAGNOSIS — S92492D Other fracture of left great toe, subsequent encounter for fracture with routine healing: Secondary | ICD-10-CM | POA: Diagnosis not present

## 2019-02-08 LAB — BASIC METABOLIC PANEL
Anion gap: 7 (ref 5–15)
BUN: 20 mg/dL (ref 8–23)
CO2: 28 mmol/L (ref 22–32)
Calcium: 8.9 mg/dL (ref 8.9–10.3)
Chloride: 108 mmol/L (ref 98–111)
Creatinine, Ser: 0.82 mg/dL (ref 0.44–1.00)
GFR calc Af Amer: >60 mL/min (ref >60)
GFR calc non Af Amer: >60 mL/min (ref >60)
Glucose, Bld: 101 mg/dL — ABNORMAL HIGH (ref 70–99)
Potassium: 3.9 mmol/L (ref 3.5–5.1)
Sodium: 143 mmol/L (ref 135–145)

## 2019-02-08 MED ORDER — HYDROCODONE-ACETAMINOPHEN 7.5-325 MG PO TABS: 1.0000 | ORAL_TABLET | Freq: Four times a day (QID) | ORAL | 0 refills | Status: DC | PRN

## 2019-02-08 NOTE — TOC Transition Note (Signed)
Transition of Care [facility]) - CM/SW Discharge Note   Patient Details  Name: Jasmine Carrillo MRN: [id number] Date of Birth: 1941-05-21  Transition of Care [facility]) CM/SW Contact:  Gelene Mink, [place] Phone Number: 02/08/2019, 10:47 AM   Clinical Narrative:     Patient will DC to: [facility]  Anticipated DC date: 02/08/2019 Family notified: Yes Transport by: Lesle Reek has been called and scheduled for 1:30   Per MD patient ready for DC to . RN, patient, patient's family, and facility notified of DC. Discharge Summary and FL2 sent to facility. RN to call report prior to discharge [phone] Ask for RN in [facility]). The patient will report to room 601P.  DC packet on chart. Ambulance transport requested for patient.   CSW will sign off for now as social work intervention is no longer needed. Please consult [place] again if new needs arise.  Manroop Jakubowicz, LCSW-A [place]/Clinical Social Work Department Cell: [phone]    Final next level of care: [place] Barriers to Discharge: No Barriers Identified   Patient Goals and CMS Choice Patient states their goals for this hospitalization and ongoing recovery are:: Pt will report to [facility] fpr rehab CMS Medicare.gov Compare Post Acute Care list provided to:: Patient Represenative (must comment) Choice offered to / list presented to : Adult Children  Discharge Placement   Existing PASRR number confirmed : 02/08/19          Patient chooses bed at: [facility] Patient to be transferred to facility by: [place] Name of family member notified: [place] Genera, Daughter Patient and family notified of of transfer: 02/08/19  Discharge Plan and Services In-house Referral: Clinical Social Work Discharge Planning Services: [employer] Consult Post Acute Care Choice: [place]          DME Arranged: N/A DME Agency: NA       HH Arranged: NA HH Agency: NA        Social Determinants of Health (SDOH)  Interventions     Readmission Risk Interventions No flowsheet data found.

## 2019-02-08 NOTE — Progress Notes (Signed)
Patient report given to RN at [facility]. All questions/concerns were answered.

## 2019-02-08 NOTE — Discharge Summary (Signed)
Physician Discharge Summary  Brishae Lancaster WCB:[id number] DOB: 1941/02/04 DOA: 02/01/2019  PCP: Laurann Montana, MD  Admit date: 02/01/2019 Discharge date: 02/08/2019  Admitted From: Home Disposition: [facility]  Recommendations for Outpatient Follow-up:  1. Follow up with PCP in 1-2 weeks 2. Follow-up with orthopedics in 1 to 2 weeks 3. Please obtain BMP/CBC in one week 4. Please follow up on the following pending results:  Home Health: None Equipment/Devices: None  Discharge Condition: Stable CODE STATUS: DNR Diet recommendation: Cardiac  Subjective: Patient seen and examined.  She remains pleasantly confused at baseline due to her dementia.  She has no complaints but looks comfortable.  Brief/Interim Summary: 78 year old female with history of CAD, CABG in 2008, dementia-possible Parkinson's, dyslipidemia, chronic constipation, depression was brought to the ER by her daughter due to recurrent falls, -She was seen in the ER originally on 9/3, after 3 falls, diagnosed with right ankle, talus fracture short-leg splint placed, postop boot and then went home, recommended nonweightbearing and orthopedics follow-up after she went back home and she fell again and subsequently was brought back in to the emergency department and was admitted under hospitalist service.  She was once again seen by orthopedics and they recommended follow-up as an outpatient within a week and continue current management with the boot in the left foot with the plan of nonweightbearing in a splint in the right talus fracture and weightbearing as tolerated in postop shoe in the left great toe fracture.  She remained hemodynamically stable with all medical issues are stable.  She was evaluated by PT OT and they recommended SNF which has been arranged for her.  She is going to be discharged to SNF today in a stable condition in agreement with her daughter.  Discharge Diagnoses:  Active Problems:   Recurrent falls    Essential hypertension   Hyperlipidemia   Dementia (HCC)   Closed right ankle fracture   Other fracture of left great toe, initial encounter for closed fracture    Discharge Instructions  Discharge Instructions    Discharge patient   Complete by: As directed    Discharge disposition: 03-Skilled Nursing Facility   Discharge patient date: 02/08/2019     Allergies as of 02/08/2019   No Known Allergies     Medication List    TAKE these medications   ASPIRIN 81 PO Take 81 mg by mouth daily.   CALCIUM PO Take 1 tablet by mouth daily.   carvedilol 6.25 MG tablet Commonly known as: COREG Take 6.25 mg by mouth 2 (two) times daily.   divalproex 250 MG DR tablet Commonly known as: Depakote Take 1 tablet (250 mg total) by mouth at bedtime.   donepezil 10 MG tablet Commonly known as: ARICEPT Take 10 mg by mouth at bedtime.   DRY EYES OP Apply 1 drop to eye daily as needed (dry eyes).   Ensure Take 237 mLs by mouth daily.   escitalopram 20 MG tablet Commonly known as: LEXAPRO Take 20 mg by mouth daily.   HYDROcodone-acetaminophen 7.5-325 MG tablet Commonly known as: NORCO Take 1 tablet by mouth every 6 (six) hours as needed for up to 10 doses (for pain).   memantine 10 MG tablet Commonly known as: NAMENDA Take 10 mg by mouth 2 (two) times daily.   MULTIVITAMIN PO Take 1 tablet by mouth daily.   simvastatin 40 MG tablet Commonly known as: ZOCOR Take 40 mg by mouth every evening.   Tretinoin (Emollient) 0.05 % Crea Take 1  application by mouth as needed.   tretinoin 0.025 % cream Commonly known as: RETIN-A Apply 1 application topically every evening.      Follow-up Information    Laurann Montana, MD Follow up in 1 week(s).   Specialty: Family Medicine Contact information: [phone] [address] [place] [address] [phone]          No Known Allergies  Consultations: Orthopedics  Procedures/Studies: Dg Pelvis 1-2 Views  Result  Date: 02/01/2019 CLINICAL DATA:  Pain status post fall EXAM: PELVIS - 1-2 VIEW COMPARISON:  None. FINDINGS: There is no evidence of pelvic fracture or diastasis. No pelvic bone lesions are seen. Advanced vascular calcifications are noted. IMPRESSION: Negative. Electronically Signed   By: Katherine Mantle M.D.   On: 02/01/2019 15:05   Dg Tibia/fibula Right  Result Date: 02/01/2019 CLINICAL DATA:  Patient had a witnessed fall by daughter and Right knee pain, traveling down the right leg. Seen yesterday at [facility] for Right ankle fracture and left big toe fracture. History of dementia EXAM: RIGHT TIBIA AND FIBULA - 2 VIEW COMPARISON:  RIGHT ankle 01/31/2019 FINDINGS: LOWER leg is imaged through splinting material. There is no acute fracture or subluxation of the tibia or fibula. No posterior talar process fracture is not well seen. Degenerative changes are seen at the knee. IMPRESSION: No evidence for acute abnormality. Electronically Signed   By: Norva Pavlov M.D.   On: 02/01/2019 15:04   Dg Ankle Complete Right  Result Date: 01/31/2019 CLINICAL DATA:  Fall, foot and ankle bruising EXAM: RIGHT ANKLE - COMPLETE 3+ VIEW COMPARISON:  None. FINDINGS: The osseous structures appear diffusely demineralized which may limit detection of small or nondisplaced fractures. There is a minimally displaced fracture of the posterior talar process. Question a nondisplaced fracture lucency versus prominent vascular channel through the talar neck. Minimal ankle swelling. No sizable left ankle effusion. Ankle mortise is congruent. Enthesopathic changes are noted at the Achilles insertion upon the calcaneus. Midfoot and hindfoot alignment is grossly preserved though incompletely assessed on these nondedicated, nonweightbearing films. IMPRESSION: 1. Minimally displaced fracture of the posterior talar process. 2. Question nondisplaced fracture lucency versus prominent vascular channel through the talar neck. Correlate with  point tenderness. Consider dedicated foot radiographs. Electronically Signed   By: Kreg Shropshire M.D.   On: 01/31/2019 02:30   Dg Abd 1 View  Result Date: 01/31/2019 CLINICAL DATA:  Constipation, foot bruising EXAM: ABDOMEN - 1 VIEW COMPARISON:  None. FINDINGS: Bowel gas pattern is nonobstructive. Sternotomy suture is noted in the lower chest. Surgical clips are present at the diaphragmatic hiatus. Atherosclerotic plaque within the normal caliber aorta. There is severe levocurvature of the lumbar spine. Multilevel degenerative changes are present throughout the spine. No visible fracture or traumatic abnormality of the pelvis is the incompletely evaluated particularly given external rotation of the right hip. IMPRESSION: Nonobstructive bowel gas pattern. Aortic Atherosclerosis (ICD10-I70.0). No visible traumatic abnormality although limited evaluation of the pelvis given partial collimation and external rotation of the right hip Electronically Signed   By: Kreg Shropshire M.D.   On: 01/31/2019 02:27   Ct Head Wo Contrast  Result Date: 02/01/2019 CLINICAL DATA:  Head trauma, witnessed fall, dementia EXAM: CT HEAD WITHOUT CONTRAST CT CERVICAL SPINE WITHOUT CONTRAST TECHNIQUE: Multidetector CT imaging of the head and cervical spine was performed following the standard protocol without intravenous contrast. Multiplanar CT image reconstructions of the cervical spine were also generated. COMPARISON:  01/31/2019 FINDINGS: CT HEAD FINDINGS Brain: No evidence  of acute infarction, hemorrhage, hydrocephalus, extra-axial collection or mass lesion/mass effect. There is extensive periventricular and deep white matter hypodensity, global volume loss, and prominence of the lateral ventricles. Vascular: No hyperdense vessel or unexpected calcification. Skull: Normal. Negative for fracture or focal lesion. Sinuses/Orbits: No acute finding. Other: None. CT CERVICAL SPINE FINDINGS Alignment: Normal. Skull base and vertebrae: No acute  fracture. No primary bone lesion or focal pathologic process. Soft tissues and spinal canal: No prevertebral fluid or swelling. No visible canal hematoma. Disc levels: Fusion of C5-C6. Otherwise mild multilevel disc space height loss. Upper chest: Emphysema. Other: Carotid and aortic atherosclerosis. IMPRESSION: 1. No acute intracranial pathology. Advanced small-vessel white matter disease and global volume loss with enlargement of the ventricles ex vacuo. 2.  No fracture or static subluxation of the cervical spine. Electronically Signed   By: Lauralyn Primes M.D.   On: 02/01/2019 15:32   Ct Head Wo Contrast  Result Date: 01/31/2019 CLINICAL DATA:  Headache, posttraumatic, positive head strike EXAM: CT HEAD WITHOUT CONTRAST CT CERVICAL SPINE WITHOUT CONTRAST TECHNIQUE: Multidetector CT imaging of the head and cervical spine was performed following the standard protocol without intravenous contrast. Multiplanar CT image reconstructions of the cervical spine were also generated. COMPARISON:  CT 08/08/2018 FINDINGS: CT HEAD FINDINGS Brain: No evidence of acute infarction, hemorrhage, hydrocephalus, extra-axial collection or mass lesion/mass effect. Symmetric prominence of the ventricles, cisterns and sulci compatible with moderate parenchymal volume loss. Patchy and confluent areas of white matter hypoattenuation are most compatible with chronic microvascular angiopathy. Senescent mineralization of the basal ganglia. Vascular: Atherosclerotic calcification of the carotid siphons. No hyperdense vessel or dural sinus. Skull: Small amount of left parieto-occipital scalp infiltration. No subjacent calvarial fracture. Sinuses/Orbits: Hypo pneumatization of the right maxillary sinus is only partially evaluated on this exam. Similar appearance to prior head CT. Remaining paranasal sinuses and mastoids are predominantly clear. Small amount of debris in the external auditory canals. Other: None. CT CERVICAL SPINE FINDINGS  Alignment: Straightening of the normal cervical lordosis with slight focal reversal across the fused C5-6 levels. The non instrumented C5-6 fusion appears complete. Degenerative anterolisthesis of C4 on C5 is unchanged from comparison. No traumatic listhesis is identified. Atlantoaxial and craniocervical articulations are maintained. Skull base and vertebrae: No acute fracture. No primary bone lesion or focal pathologic process. Soft tissues and spinal canal: No pre or paravertebral fluid or swelling. No visible canal hematoma. Disc levels: Fusion of the C5-6 levels. Adjacent segment disease at C6-7 with cervical spondylitic endplate changes. Disc osteophyte complex at C4-5. No significant spinal canal stenosis. Moderate bilateral foraminal narrowing at C6-7, mild left and moderate right foraminal narrowing at C4-5. Upper chest: No acute abnormality in the upper chest or imaged lung apices. Biapical pleuroparenchymal scarring and emphysematous changes. Atherosclerotic calcification of the aortic arch and great vessels. Calcification of the common carotids and carotid bifurcations. Surgical clips present at the sternal notch. Stable subcentimeter thyroid nodule in the posterior left lobe. Other: None. IMPRESSION: 1. No acute intracranial abnormality. 2. Small amount of left parieto-occipital scalp infiltration without subjacent calvarial fracture. 3. No acute cervical spine fracture. 4. 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 5. Aortic Atherosclerosis (ICD10-I70.0).  Carotid atherosclerosis. 6. Emphysema (ICD10-J43.9). 7. Subcentimeter nodule in the posterior left lobe thyroid. No further imaging required. This follows ACR consensus guidelines: Managing Incidental Thyroid Nodules Detected on Imaging: White Paper of the ACR Incidental Thyroid Findings Committee. J Am Coll Radiol 2015; 12:143-150. Electronically Signed  By: Kreg Shropshire M.D.    On: 01/31/2019 02:25   Ct Cervical Spine Wo Contrast  Result Date: 02/01/2019 CLINICAL DATA:  Head trauma, witnessed fall, dementia EXAM: CT HEAD WITHOUT CONTRAST CT CERVICAL SPINE WITHOUT CONTRAST TECHNIQUE: Multidetector CT imaging of the head and cervical spine was performed following the standard protocol without intravenous contrast. Multiplanar CT image reconstructions of the cervical spine were also generated. COMPARISON:  01/31/2019 FINDINGS: CT HEAD FINDINGS Brain: No evidence of acute infarction, hemorrhage, hydrocephalus, extra-axial collection or mass lesion/mass effect. There is extensive periventricular and deep white matter hypodensity, global volume loss, and prominence of the lateral ventricles. Vascular: No hyperdense vessel or unexpected calcification. Skull: Normal. Negative for fracture or focal lesion. Sinuses/Orbits: No acute finding. Other: None. CT CERVICAL SPINE FINDINGS Alignment: Normal. Skull base and vertebrae: No acute fracture. No primary bone lesion or focal pathologic process. Soft tissues and spinal canal: No prevertebral fluid or swelling. No visible canal hematoma. Disc levels: Fusion of C5-C6. Otherwise mild multilevel disc space height loss. Upper chest: Emphysema. Other: Carotid and aortic atherosclerosis. IMPRESSION: 1. No acute intracranial pathology. Advanced small-vessel white matter disease and global volume loss with enlargement of the ventricles ex vacuo. 2.  No fracture or static subluxation of the cervical spine. Electronically Signed   By: Lauralyn Primes M.D.   On: 02/01/2019 15:32   Ct Cervical Spine Wo Contrast  Result Date: 01/31/2019 CLINICAL DATA:  Headache, posttraumatic, positive head strike EXAM: CT HEAD WITHOUT CONTRAST CT CERVICAL SPINE WITHOUT CONTRAST TECHNIQUE: Multidetector CT imaging of the head and cervical spine was performed following the standard protocol without intravenous contrast. Multiplanar CT image reconstructions of the cervical spine  were also generated. COMPARISON:  CT 08/08/2018 FINDINGS: CT HEAD FINDINGS Brain: No evidence of acute infarction, hemorrhage, hydrocephalus, extra-axial collection or mass lesion/mass effect. Symmetric prominence of the ventricles, cisterns and sulci compatible with moderate parenchymal volume loss. Patchy and confluent areas of white matter hypoattenuation are most compatible with chronic microvascular angiopathy. Senescent mineralization of the basal ganglia. Vascular: Atherosclerotic calcification of the carotid siphons. No hyperdense vessel or dural sinus. Skull: Small amount of left parieto-occipital scalp infiltration. No subjacent calvarial fracture. Sinuses/Orbits: Hypo pneumatization of the right maxillary sinus is only partially evaluated on this exam. Similar appearance to prior head CT. Remaining paranasal sinuses and mastoids are predominantly clear. Small amount of debris in the external auditory canals. Other: None. CT CERVICAL SPINE FINDINGS Alignment: Straightening of the normal cervical lordosis with slight focal reversal across the fused C5-6 levels. The non instrumented C5-6 fusion appears complete. Degenerative anterolisthesis of C4 on C5 is unchanged from comparison. No traumatic listhesis is identified. Atlantoaxial and craniocervical articulations are maintained. Skull base and vertebrae: No acute fracture. No primary bone lesion or focal pathologic process. Soft tissues and spinal canal: No pre or paravertebral fluid or swelling. No visible canal hematoma. Disc levels: Fusion of the C5-6 levels. Adjacent segment disease at C6-7 with cervical spondylitic endplate changes. Disc osteophyte complex at C4-5. No significant spinal canal stenosis. Moderate bilateral foraminal narrowing at C6-7, mild left and moderate right foraminal narrowing at C4-5. Upper chest: No acute abnormality in the upper chest or imaged lung apices. Biapical pleuroparenchymal scarring and emphysematous changes.  Atherosclerotic calcification of the aortic arch and great vessels. Calcification of the common carotids and carotid bifurcations. Surgical clips present at the sternal notch. Stable subcentimeter thyroid nodule in the posterior left lobe. Other: None. IMPRESSION: 1. No acute intracranial abnormality. 2. Small amount of  left parieto-occipital scalp infiltration without subjacent calvarial fracture. 3. No acute cervical spine fracture. 4. 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 5. Aortic Atherosclerosis (ICD10-I70.0).  Carotid atherosclerosis. 6. Emphysema (ICD10-J43.9). 7. Subcentimeter nodule in the posterior left lobe thyroid. No further imaging required. This follows ACR consensus guidelines: Managing Incidental Thyroid Nodules Detected on Imaging: White Paper of the ACR Incidental Thyroid Findings Committee. J Am Coll Radiol 2015; 12:143-150. Electronically Signed   By: Lovena Le M.D.   On: 01/31/2019 02:25   Dg Shoulder Left  Result Date: 01/30/2019 CLINICAL DATA:  Left shoulder pain after fall today. EXAM: LEFT SHOULDER - 2+ VIEW COMPARISON:  None. FINDINGS: There is no evidence of fracture or dislocation. There is no evidence of arthropathy or other focal bone abnormality. Soft tissues are unremarkable. IMPRESSION: Negative. Electronically Signed   By: Marijo Conception M.D.   On: 01/30/2019 16:10   Dg Knee Complete 4 Views Right  Result Date: 02/01/2019 CLINICAL DATA:  Pain status post fall EXAM: RIGHT KNEE - COMPLETE 4+ VIEW COMPARISON:  None. 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 There is mild joint space narrowing. There is no displaced fracture. No dislocation. However, evaluation is limited by osteopenia. Advanced vascular calcifications are noted. IMPRESSION: Degenerative changes without acute osseous finding. Electronically Signed   By: Constance Holster M.D.   On: 02/01/2019 15:03   Dg Foot Complete Left  Result  Date: 01/31/2019 CLINICAL DATA:  Fall, foot bruising at the base of the great toe. EXAM: LEFT FOOT - COMPLETE 3+ VIEW COMPARISON:  None. FINDINGS: The osseous structures appear diffusely demineralized which may limit detection of small or nondisplaced fractures. Small nondisplaced fracture involving the medial base of the first proximal phalanx. Mild adjacent soft tissue swelling. No other definite acute fracture or traumatic malalignment. Midfoot and hindfoot alignment is grossly preserved though incompletely assessed on nonweightbearing films. Small corticated os peroneum is noted. No subcutaneous gas or foreign body. Vascular calcium is noted in the soft tissues. Soft tissues are otherwise unremarkable. IMPRESSION: Small nondisplaced fracture involving the medial base of the first proximal phalanx. Electronically Signed   By: Lovena Le M.D.   On: 01/31/2019 02:33   Dg Hip Unilat With Pelvis 2-3 Views Left  Result Date: 01/30/2019 CLINICAL DATA:  Left hip pain after fall today. EXAM: DG HIP (WITH OR WITHOUT PELVIS) 2-3V LEFT COMPARISON:  None. FINDINGS: There is no evidence of hip fracture or dislocation. There is no evidence of arthropathy or other focal bone abnormality. IMPRESSION: Negative. Electronically Signed   By: Marijo Conception M.D.   On: 01/30/2019 16:11   Dg Femur Min 2 Views Right  Result Date: 02/01/2019 CLINICAL DATA:  Pain status post fall EXAM: RIGHT FEMUR 2 VIEWS COMPARISON:  None. FINDINGS: There is no evidence of fracture or other focal bone lesions. Soft tissues are unremarkable. Advanced vascular calcifications are noted. IMPRESSION: Negative. Electronically Signed   By: Constance Holster M.D.   On: 02/01/2019 15:04      Discharge Exam: Vitals:   02/08/19 0504 02/08/19 0810  BP: 126/75 (!) 148/85  Pulse: 86 93  Resp: 18 17  Temp: 98.8 F (37.1 C) 98.6 F (37 C)  SpO2: 92% 91%   Vitals:   02/07/19 1346 02/07/19 2252 02/08/19 0504 02/08/19 0810  BP: 112/71 (!) 108/58  126/75 (!) 148/85  Pulse: 87 88 86 93  Resp: 16 18 18 17   Temp: 97.6 F (36.4 C) 98.7 F (37.1  C) 98.8 F (37.1 C) 98.6 F (37 C)  TempSrc: Oral Axillary Oral Oral  SpO2: 96% 93% 92% 91%    General: Pt is alert, awake, not in acute distress Cardiovascular: RRR, S1/S2 +, no rubs, no gallops Respiratory: CTA bilaterally, no wheezing, no rhonchi Abdominal: Soft, NT, ND, bowel sounds + Extremities: Has postop boot in the left lower extremity and dressing in the right lower extremity.    The results of significant diagnostics from this hospitalization (including imaging, microbiology, ancillary and laboratory) are listed below for reference.     Microbiology: Recent Results (from the past 240 hour(s))  SARS Coronavirus 2 [facility] order, Performed in [facility] hospital lab) Nasopharyngeal Nasopharyngeal Swab     Status: None   Collection Time: 02/01/19  4:36 PM   Specimen: Nasopharyngeal Swab  Result Value Ref Range Status   SARS Coronavirus 2 NEGATIVE NEGATIVE Final    Comment: (NOTE) If result is NEGATIVE SARS-CoV-2 target nucleic acids are NOT DETECTED. The SARS-CoV-2 RNA is generally detectable in upper and lower  respiratory specimens during the acute phase of infection. The lowest  concentration of SARS-CoV-2 viral copies this assay can detect is 250  copies / mL. A negative result does not preclude SARS-CoV-2 infection  and should not be used as the sole basis for treatment or other  patient management decisions.  A negative result may occur with  improper specimen collection / handling, submission of specimen other  than nasopharyngeal swab, presence of viral mutation(s) within the  areas targeted by this assay, and inadequate number of viral copies  (<250 copies / mL). A negative result must be combined with clinical  observations, patient history, and epidemiological information. If result is POSITIVE SARS-CoV-2 target nucleic acids are DETECTED. The SARS-CoV-2  RNA is generally detectable in upper and lower  respiratory specimens dur ing the acute phase of infection.  Positive  results are indicative of active infection with SARS-CoV-2.  Clinical  correlation with patient history and other diagnostic information is  necessary to determine patient infection status.  Positive results do  not rule out bacterial infection or co-infection with other viruses. If result is PRESUMPTIVE POSTIVE SARS-CoV-2 nucleic acids MAY BE PRESENT.   A presumptive positive result was obtained on the submitted specimen  and confirmed on repeat testing.  While 2019 novel coronavirus  (SARS-CoV-2) nucleic acids may be present in the submitted sample  additional confirmatory testing may be necessary for epidemiological  and / or clinical management purposes  to differentiate between  SARS-CoV-2 and other Sarbecovirus currently known to infect humans.  If clinically indicated additional testing with an alternate test  methodology [phone]) is advised. The SARS-CoV-2 RNA is generally  detectable in upper and lower respiratory sp ecimens during the acute  phase of infection. The expected result is Negative. Fact Sheet for Patients:  BoilerBrush.com.cy Fact Sheet for Healthcare Providers: https://pope.com/ This test is not yet approved or cleared by the [place] FDA and has been authorized for detection and/or diagnosis of SARS-CoV-2 by FDA under an Emergency Use Authorization (EUA).  This EUA will remain in effect (meaning this test can be used) for the duration of the COVID-19 declaration under Section 564(b)(1) of the Act, 21 U.S.C. section 360bbb-3(b)(1), unless the authorization is terminated or revoked sooner. Performed at [facility], [address] [place], [place] [address]   SARS CORONAVIRUS 2 (TAT 6-24 HRS) Nasopharyngeal Nasopharyngeal Swab     Status: None   Collection Time: 02/07/19  4:55 PM    Specimen: Nasopharyngeal Swab  Result Value Ref Range Status   SARS Coronavirus 2 NEGATIVE NEGATIVE Final    Comment: (NOTE) SARS-CoV-2 target nucleic acids are NOT DETECTED. The SARS-CoV-2 RNA is generally detectable in upper and lower respiratory specimens during the acute phase of infection. Negative results do not preclude SARS-CoV-2 infection, do not rule out co-infections with other pathogens, and should not be used as the sole basis for treatment or other patient management decisions. Negative results must be combined with clinical observations, patient history, and epidemiological information. The expected result is Negative. Fact Sheet for Patients: HairSlick.nohttps://www.fda.gov/media/138098/download Fact Sheet for Healthcare Providers: quierodirigir.comhttps://www.fda.gov/media/138095/download This test is not yet approved or cleared by the [place] FDA and  has been authorized for detection and/or diagnosis of SARS-CoV-2 by FDA under an Emergency Use Authorization (EUA). This EUA will remain  in effect (meaning this test can be used) for the duration of the COVID-19 declaration under Section 56 4(b)(1) of the Act, 21 U.S.C. section 360bbb-3(b)(1), unless the authorization is terminated or revoked sooner. Performed at [facility], [address] [place], [place] [address]      Labs: BNP (last 3 results) No results for input(s): BNP in the last 8760 hours. Basic Metabolic Panel: Recent Labs  Lab 02/01/19 1733 02/02/19 0432 02/03/19 1634 02/04/19 0420 02/07/19 0353 02/08/19 0747  NA  --  140 138 139 140 143  K  --  3.4* 3.7 3.4* 3.2* 3.9  CL  --  107 104 107 106 108  CO2  --  23 20* 25 25 28   GLUCOSE  --  98 109* 101* 116* 101*  BUN  --  13 15 17 18 20   CREATININE 0.68 0.60 0.64 0.60 0.49 0.82  CALCIUM  --  8.6* 9.0 8.5* 8.6* 8.9  MG 2.2  --   --   --   --   --   PHOS 3.3  --   --   --   --   --    Liver Function Tests: Recent Labs  Lab 02/02/19 0432  AST 19  ALT  14  ALKPHOS 57  BILITOT 0.8  PROT 5.9*  ALBUMIN 3.3*   No results for input(s): LIPASE, AMYLASE in the last 168 hours. Recent Labs  Lab 02/03/19 1634  AMMONIA 21   CBC: Recent Labs  Lab 02/01/19 1533 02/01/19 1733 02/02/19 0432 02/03/19 1634 02/04/19 0420  WBC 5.3 5.9 5.8 6.2 6.2  NEUTROABS 3.6  --   --   --   --   HGB 13.3 12.7 12.7 13.8 12.7  HCT 41.6 40.8 39.1 42.6 38.9  MCV 95.9 97.6 94.9 94.2 93.5  PLT 218 222 208 254 225   Cardiac Enzymes: No results for input(s): CKTOTAL, CKMB, CKMBINDEX, TROPONINI in the last 168 hours. BNP: Invalid input(s): POCBNP CBG: Recent Labs  Lab 02/04/19 2105 02/05/19 0656  GLUCAP 117* 96   D-Dimer No results for input(s): DDIMER in the last 72 hours. Hgb A1c No results for input(s): HGBA1C in the last 72 hours. Lipid Profile No results for input(s): CHOL, HDL, LDLCALC, TRIG, CHOLHDL, LDLDIRECT in the last 72 hours. Thyroid function studies No results for input(s): TSH, T4TOTAL, T3FREE, THYROIDAB in the last 72 hours.  Invalid input(s): FREET3 Anemia work up No results for input(s): VITAMINB12, FOLATE, FERRITIN, TIBC, IRON, RETICCTPCT in the last 72 hours. Urinalysis    Component Value Date/Time   COLORURINE YELLOW 02/01/2019 1615   APPEARANCEUR CLEAR 02/01/2019  1615   LABSPEC 1.016 02/01/2019 1615   PHURINE 5.0 02/01/2019 1615   GLUCOSEU NEGATIVE 02/01/2019 1615   HGBUR NEGATIVE 02/01/2019 1615   BILIRUBINUR NEGATIVE 02/01/2019 1615   KETONESUR NEGATIVE 02/01/2019 1615   PROTEINUR NEGATIVE 02/01/2019 1615   NITRITE NEGATIVE 02/01/2019 1615   LEUKOCYTESUR NEGATIVE 02/01/2019 1615   Sepsis Labs Invalid input(s): PROCALCITONIN,  WBC,  LACTICIDVEN Microbiology Recent Results (from the past 240 hour(s))  SARS Coronavirus 2 [facility] order, Performed in [facility] hospital lab) Nasopharyngeal Nasopharyngeal Swab     Status: None   Collection Time: 02/01/19  4:36 PM   Specimen: Nasopharyngeal Swab  Result Value Ref  Range Status   SARS Coronavirus 2 NEGATIVE NEGATIVE Final    Comment: (NOTE) If result is NEGATIVE SARS-CoV-2 target nucleic acids are NOT DETECTED. The SARS-CoV-2 RNA is generally detectable in upper and lower  respiratory specimens during the acute phase of infection. The lowest  concentration of SARS-CoV-2 viral copies this assay can detect is 250  copies / mL. A negative result does not preclude SARS-CoV-2 infection  and should not be used as the sole basis for treatment or other  patient management decisions.  A negative result may occur with  improper specimen collection / handling, submission of specimen other  than nasopharyngeal swab, presence of viral mutation(s) within the  areas targeted by this assay, and inadequate number of viral copies  (<250 copies / mL). A negative result must be combined with clinical  observations, patient history, and epidemiological information. If result is POSITIVE SARS-CoV-2 target nucleic acids are DETECTED. The SARS-CoV-2 RNA is generally detectable in upper and lower  respiratory specimens dur ing the acute phase of infection.  Positive  results are indicative of active infection with SARS-CoV-2.  Clinical  correlation with patient history and other diagnostic information is  necessary to determine patient infection status.  Positive results do  not rule out bacterial infection or co-infection with other viruses. If result is PRESUMPTIVE POSTIVE SARS-CoV-2 nucleic acids MAY BE PRESENT.   A presumptive positive result was obtained on the submitted specimen  and confirmed on repeat testing.  While 2019 novel coronavirus  (SARS-CoV-2) nucleic acids may be present in the submitted sample  additional confirmatory testing may be necessary for epidemiological  and / or clinical management purposes  to differentiate between  SARS-CoV-2 and other Sarbecovirus currently known to infect humans.  If clinically indicated additional testing with an  alternate test  methodology [phone]) is advised. The SARS-CoV-2 RNA is generally  detectable in upper and lower respiratory sp ecimens during the acute  phase of infection. The expected result is Negative. Fact Sheet for Patients:  BoilerBrush.com.cy Fact Sheet for Healthcare Providers: https://pope.com/ This test is not yet approved or cleared by the [place] FDA and has been authorized for detection and/or diagnosis of SARS-CoV-2 by FDA under an Emergency Use Authorization (EUA).  This EUA will remain in effect (meaning this test can be used) for the duration of the COVID-19 declaration under Section 564(b)(1) of the Act, 21 U.S.C. section 360bbb-3(b)(1), unless the authorization is terminated or revoked sooner. Performed at [facility], [address] [place], [place] [address]   SARS CORONAVIRUS 2 (TAT 6-24 HRS) Nasopharyngeal Nasopharyngeal Swab     Status: None   Collection Time: 02/07/19  4:55 PM   Specimen: Nasopharyngeal Swab  Result Value Ref Range Status   SARS Coronavirus 2 NEGATIVE NEGATIVE Final    Comment: (NOTE) SARS-CoV-2 target nucleic acids  are NOT DETECTED. The SARS-CoV-2 RNA is generally detectable in upper and lower respiratory specimens during the acute phase of infection. Negative results do not preclude SARS-CoV-2 infection, do not rule out co-infections with other pathogens, and should not be used as the sole basis for treatment or other patient management decisions. Negative results must be combined with clinical observations, patient history, and epidemiological information. The expected result is Negative. Fact Sheet for Patients: HairSlick.nohttps://www.fda.gov/media/138098/download Fact Sheet for Healthcare Providers: quierodirigir.comhttps://www.fda.gov/media/138095/download This test is not yet approved or cleared by the [place] FDA and  has been authorized for detection and/or diagnosis of SARS-CoV-2  by FDA under an Emergency Use Authorization (EUA). This EUA will remain  in effect (meaning this test can be used) for the duration of the COVID-19 declaration under Section 56 4(b)(1) of the Act, 21 U.S.C. section 360bbb-3(b)(1), unless the authorization is terminated or revoked sooner. Performed at [facility], [address] [place], [place] [address]      Time coordinating discharge: Over 30 minutes  SIGNED:   Hughie Clossavi Kaige Whistler, MD  [facility] 02/08/2019, 8:59 AM Pager [phone]  If 7PM-7AM, please contact night-coverage [email or web address] Password [email or web address]

## 2019-02-08 NOTE — Plan of Care (Signed)
  Problem: Education: Goal: Knowledge of General Education information will improve Description: Including pain rating scale, medication(s)/side effects and non-pharmacologic comfort measures Outcome: Progressing   Problem: Clinical Measurements: Goal: Ability to maintain clinical measurements within normal limits will improve Outcome: Progressing Goal: Will remain free from infection Outcome: Progressing   Problem: Activity: Goal: Risk for activity intolerance will decrease Outcome: Progressing   Problem: Elimination: Goal: Will not experience complications related to bowel motility Outcome: Progressing   Problem: Pain Managment: Goal: General experience of comfort will improve Outcome: Progressing   Problem: Safety: Goal: Ability to remain free from injury will improve Outcome: Progressing   Problem: Skin Integrity: Goal: Risk for impaired skin integrity will decrease Outcome: Progressing   

## 2019-02-08 NOTE — Discharge Instructions (Signed)
Ankle Fracture  The ankle joint is made up of the lower (distal) sections of your lower leg bones(tibia and fibula) along with a bone in your foot (talus). An ankle fracture is a break in one, two, or all three of these sections of bone. Follow these instructions at home: If you have a splint:  Wear the splint as told by your doctor. Take it off only as told by your doctor.  Loosen the splint if your toes tingle, become numb, or turn cold and blue.  Keep the splint clean.  If the splint is not waterproof: ? Do not let it get wet. ? Cover it with a watertight covering when you take a bath or a shower. If you have a cast:  Do not stick anything inside the cast to scratch your skin. Doing that increases your risk of infection.  Check the skin around the cast every day. Tell your doctor about any concerns.  You may put lotion on dry skin around the edges of the cast. Do not put lotion on the skin underneath the cast.  Keep the cast clean.  If the cast is not waterproof: ? Do not let it get wet. ? Cover it with a watertight covering when you take a bath or a shower. Managing pain, stiffness, and swelling  If directed, put ice on the injured area: ? If you have a removable splint, remove it as told by your doctor. ? Put ice in a plastic bag. ? Place a towel between your skin and the bag. ? Leave the ice on for 20 minutes, 2-3 times a day.  Move your toes often. This prevents stiffness and lessens swelling.  Raise (elevate) the injured area above the level of your heart while you are sitting or lying down. General instructions  Do not use the injured limb to support your body weight until your doctor says that you can. Use crutches as told by your doctor.  Take over-the-counter and prescription medicines only as told by your doctor.  Ask your doctor when it is safe to drive if you have a cast or splint.  Do exercises as told by your doctor.  Do not use any products that  contain nicotine or tobacco, such as cigarettes and e-cigarettes. These can delay bone healing. If you need help quitting, ask your doctor.  Keep all follow-up visits as told by your doctor. This is important. Contact a doctor if:  Your pain or swelling gets worse.  Your pain or swelling does not get better when you rest or take medicine. Get help right away if:  Your cast gets damaged.  You continue to have very bad pain.  You have new pain or swelling.  Your skin or toes below the injured ankle: ? Turn blue or gray. ? Feel cold or numb. ? Lose sensitivity to touch. Summary  An ankle fracture is a break in one, two, or all three of the bones in your lower leg and lower foot.  If you have a splint, wear it as told by your health care provider. Keep it clean and dry.  If you have a cast, do not stick anything inside the cast to scratch your skin. This can cause infection.  Use ice, take medicines, raise your foot, and avoid tobacco and nicotine products. These steps will lessen pain and swelling and speed up healing. This information is not intended to replace advice given to you by your health care provider. Make sure you discuss   any questions you have with your health care provider. Document Released: 03/13/2009 Document Revised: 04/28/2017 Document Reviewed: 06/20/2016 Elsevier Patient Education  2020 Elsevier Inc.  

## 2019-02-14 ENCOUNTER — Other Ambulatory Visit: Payer: Self-pay | Admitting: *Deleted

## 2019-02-14 NOTE — Patient Outreach (Signed)
Member assessed for potential [facility] Care Management needs as a benefit of  [place] Medicare.  Member is currently receiving rehab therapy at [facility].  Member discussed in weekly telephonic IDT meeting with facility staff, [facility] UM team, and writer.  Facility reports member is from home with daughter who is primary contact and Media planner. Member currently NWB status to [facility] for 6 weeks. Will likely need custodial break.  Will continue to follow for disposition plans, progression, and for potential [facility] Care Management needs.  Will plan outreach to daughter once disposition plan is more clear.  Marthenia Rolling, MSN-Ed, RN,BSN [place] Acute Care Coordinator [phone] [facility]) [phone]  (Toll free office)

## 2019-02-21 ENCOUNTER — Other Ambulatory Visit: Payer: Self-pay | Admitting: *Deleted

## 2019-02-21 DIAGNOSIS — I10 Essential (primary) hypertension: Secondary | ICD-10-CM | POA: Diagnosis not present

## 2019-02-21 DIAGNOSIS — S82891A Other fracture of right lower leg, initial encounter for closed fracture: Secondary | ICD-10-CM | POA: Diagnosis not present

## 2019-02-21 DIAGNOSIS — G8929 Other chronic pain: Secondary | ICD-10-CM | POA: Diagnosis not present

## 2019-02-21 DIAGNOSIS — R627 Adult failure to thrive: Secondary | ICD-10-CM | POA: Diagnosis not present

## 2019-02-21 DIAGNOSIS — J410 Simple chronic bronchitis: Secondary | ICD-10-CM | POA: Diagnosis not present

## 2019-02-21 DIAGNOSIS — S92919A Unspecified fracture of unspecified toe(s), initial encounter for closed fracture: Secondary | ICD-10-CM | POA: Diagnosis not present

## 2019-02-21 DIAGNOSIS — F0280 Dementia in other diseases classified elsewhere without behavioral disturbance: Secondary | ICD-10-CM | POA: Diagnosis not present

## 2019-02-21 NOTE — Patient Outreach (Signed)
Member assessed for potential [facility] Care Management needs as a benefit of  [place] Medicare.  Member is currently receiving rehab therapy at [facility].  Member discussed in weekly telephonic IDT meeting with  facility staff, [facility] UM team, and writer.  Facility reports they are trying to gain clarification on member's non-weight bearing status. Care plan meeting is scheduled with family this Friday. Medicaid application is pending per facility. Facility reports the dc plan is for long term care at the facility.   Writer will continue to follow and engage as appropriate for potential [place] Management discussions.  Marthenia Rolling, MSN-Ed, RN,BSN [place] Acute Care Coordinator [phone] [facility] [phone]  (Toll free office)

## 2019-02-27 DIAGNOSIS — S92415A Nondisplaced fracture of proximal phalanx of left great toe, initial encounter for closed fracture: Secondary | ICD-10-CM | POA: Diagnosis not present

## 2019-02-27 DIAGNOSIS — S92154A Nondisplaced avulsion fracture (chip fracture) of right talus, initial encounter for closed fracture: Secondary | ICD-10-CM | POA: Diagnosis not present

## 2019-03-04 ENCOUNTER — Other Ambulatory Visit: Payer: Self-pay | Admitting: *Deleted

## 2019-03-04 NOTE — Patient Outreach (Signed)
Member assessed for potential [facility] Care Management needs as a benefit of  [place] Medicare.  Member is currently receiving rehab therapy at [facility].  Facility recently reported member's daughter is primary contact person. Mrs. Bir has notable confusion and dementia.   Telephone call made to daughter Annaya Bangert at [phone]. Patient identifiers confirmed.   [name] (daughter) reports she is hopeful that Mrs. Leibold's Medicaid application is approved for long term placement. Medicaid application is pending. [name] reports her mom is now off of non weight bearing status.  [name] reports she understands she will need to have alternative plans if Medicaid is not approved. Discussed that writer will remain in contact in case additional assistance is needed. Also Probation officer briefly mentioned palliative care could also be a resource to her and Mrs. Billick due to Mrs. Gross's declining dementia.   In light of all of what [name] is trying to juggle and coordinate, there was not a detailed palliative care discussion.  [name] is a professor at [employer] and is also the Scientist, forensic at [employer]. She reports she is the main caregiver as other family members are now deceased.  Provided [facility] Care Management and writer's contact information to [name]. Will plan to follow up with [name] at later time.   Marthenia Rolling, MSN-Ed, RN,BSN [place] Acute Care Coordinator [phone] [facility]) [phone]  (Toll free office)

## 2019-03-05 DIAGNOSIS — S82891A Other fracture of right lower leg, initial encounter for closed fracture: Secondary | ICD-10-CM | POA: Diagnosis not present

## 2019-03-05 DIAGNOSIS — S92919A Unspecified fracture of unspecified toe(s), initial encounter for closed fracture: Secondary | ICD-10-CM | POA: Diagnosis not present

## 2019-03-05 DIAGNOSIS — G8929 Other chronic pain: Secondary | ICD-10-CM | POA: Diagnosis not present

## 2019-03-05 DIAGNOSIS — R300 Dysuria: Secondary | ICD-10-CM | POA: Diagnosis not present

## 2019-03-08 DIAGNOSIS — E860 Dehydration: Secondary | ICD-10-CM | POA: Diagnosis not present

## 2019-03-08 DIAGNOSIS — F0280 Dementia in other diseases classified elsewhere without behavioral disturbance: Secondary | ICD-10-CM | POA: Diagnosis not present

## 2019-03-08 DIAGNOSIS — Z789 Other specified health status: Secondary | ICD-10-CM | POA: Diagnosis not present

## 2019-03-08 DIAGNOSIS — R300 Dysuria: Secondary | ICD-10-CM | POA: Diagnosis not present

## 2019-03-08 DIAGNOSIS — E870 Hyperosmolality and hypernatremia: Secondary | ICD-10-CM | POA: Diagnosis not present

## 2019-03-12 DIAGNOSIS — J410 Simple chronic bronchitis: Secondary | ICD-10-CM | POA: Diagnosis not present

## 2019-03-12 DIAGNOSIS — R300 Dysuria: Secondary | ICD-10-CM | POA: Diagnosis not present

## 2019-03-12 DIAGNOSIS — R4189 Other symptoms and signs involving cognitive functions and awareness: Secondary | ICD-10-CM | POA: Diagnosis not present

## 2019-03-12 DIAGNOSIS — E870 Hyperosmolality and hypernatremia: Secondary | ICD-10-CM | POA: Diagnosis not present

## 2019-03-20 DIAGNOSIS — S92154D Nondisplaced avulsion fracture (chip fracture) of right talus, subsequent encounter for fracture with routine healing: Secondary | ICD-10-CM | POA: Diagnosis not present

## 2019-03-20 DIAGNOSIS — E860 Dehydration: Secondary | ICD-10-CM | POA: Diagnosis not present

## 2019-03-20 DIAGNOSIS — S82891A Other fracture of right lower leg, initial encounter for closed fracture: Secondary | ICD-10-CM | POA: Diagnosis not present

## 2019-03-20 DIAGNOSIS — S92919A Unspecified fracture of unspecified toe(s), initial encounter for closed fracture: Secondary | ICD-10-CM | POA: Diagnosis not present

## 2019-03-20 DIAGNOSIS — E870 Hyperosmolality and hypernatremia: Secondary | ICD-10-CM | POA: Diagnosis not present

## 2019-03-20 DIAGNOSIS — R300 Dysuria: Secondary | ICD-10-CM | POA: Diagnosis not present

## 2019-03-20 DIAGNOSIS — F0280 Dementia in other diseases classified elsewhere without behavioral disturbance: Secondary | ICD-10-CM | POA: Diagnosis not present

## 2019-03-20 DIAGNOSIS — R627 Adult failure to thrive: Secondary | ICD-10-CM | POA: Diagnosis not present

## 2019-03-20 DIAGNOSIS — I2510 Atherosclerotic heart disease of native coronary artery without angina pectoris: Secondary | ICD-10-CM | POA: Diagnosis not present

## 2019-03-25 ENCOUNTER — Other Ambulatory Visit: Payer: Self-pay | Admitting: *Deleted

## 2019-03-25 NOTE — Patient Outreach (Signed)
Member assessed for potential [facility] Care Management needs as a benefit of [place] Medicare.  Confirmed in Patient [place] and in [facility] that member transitioned to long term care at [facility].   No identifiable [facility] Care Management needs at this time.   Marthenia Rolling, MSN-Ed, RN,BSN [place] Acute Care Coordinator [phone] [facility]) [phone]  (Toll free office)

## 2019-04-30 DIAGNOSIS — G20 Parkinson's disease: Secondary | ICD-10-CM | POA: Diagnosis not present

## 2019-04-30 DIAGNOSIS — F0280 Dementia in other diseases classified elsewhere without behavioral disturbance: Secondary | ICD-10-CM | POA: Diagnosis not present

## 2019-04-30 DIAGNOSIS — E870 Hyperosmolality and hypernatremia: Secondary | ICD-10-CM | POA: Diagnosis not present

## 2019-04-30 DIAGNOSIS — G8929 Other chronic pain: Secondary | ICD-10-CM | POA: Diagnosis not present

## 2019-04-30 DIAGNOSIS — I700 Atherosclerosis of aorta: Secondary | ICD-10-CM | POA: Diagnosis not present

## 2019-04-30 DIAGNOSIS — I10 Essential (primary) hypertension: Secondary | ICD-10-CM | POA: Diagnosis not present

## 2019-05-09 DIAGNOSIS — F0280 Dementia in other diseases classified elsewhere without behavioral disturbance: Secondary | ICD-10-CM | POA: Diagnosis not present

## 2019-05-09 DIAGNOSIS — L89153 Pressure ulcer of sacral region, stage 3: Secondary | ICD-10-CM | POA: Diagnosis not present

## 2019-05-10 DIAGNOSIS — L308 Other specified dermatitis: Secondary | ICD-10-CM | POA: Diagnosis not present

## 2019-05-10 DIAGNOSIS — E870 Hyperosmolality and hypernatremia: Secondary | ICD-10-CM | POA: Diagnosis not present

## 2019-05-10 DIAGNOSIS — R634 Abnormal weight loss: Secondary | ICD-10-CM | POA: Diagnosis not present

## 2019-05-14 DIAGNOSIS — U071 COVID-19: Secondary | ICD-10-CM | POA: Diagnosis not present

## 2019-05-21 DIAGNOSIS — U071 COVID-19: Secondary | ICD-10-CM | POA: Diagnosis not present

## 2019-05-28 DIAGNOSIS — U071 COVID-19: Secondary | ICD-10-CM | POA: Diagnosis not present

## 2019-05-31 DIAGNOSIS — S82891D Other fracture of right lower leg, subsequent encounter for closed fracture with routine healing: Secondary | ICD-10-CM | POA: Diagnosis not present

## 2019-05-31 DIAGNOSIS — R41841 Cognitive communication deficit: Secondary | ICD-10-CM | POA: Diagnosis not present

## 2019-05-31 DIAGNOSIS — R278 Other lack of coordination: Secondary | ICD-10-CM | POA: Diagnosis not present

## 2019-05-31 DIAGNOSIS — R1312 Dysphagia, oropharyngeal phase: Secondary | ICD-10-CM | POA: Diagnosis not present

## 2019-05-31 DIAGNOSIS — R2689 Other abnormalities of gait and mobility: Secondary | ICD-10-CM | POA: Diagnosis not present

## 2019-05-31 DIAGNOSIS — R2681 Unsteadiness on feet: Secondary | ICD-10-CM | POA: Diagnosis not present

## 2019-05-31 DIAGNOSIS — S82891S Other fracture of right lower leg, sequela: Secondary | ICD-10-CM | POA: Diagnosis not present

## 2019-05-31 DIAGNOSIS — S92492S Other fracture of left great toe, sequela: Secondary | ICD-10-CM | POA: Diagnosis not present

## 2019-05-31 DIAGNOSIS — R293 Abnormal posture: Secondary | ICD-10-CM | POA: Diagnosis not present

## 2019-05-31 DIAGNOSIS — M6281 Muscle weakness (generalized): Secondary | ICD-10-CM | POA: Diagnosis not present

## 2019-05-31 DIAGNOSIS — S92492D Other fracture of left great toe, subsequent encounter for fracture with routine healing: Secondary | ICD-10-CM | POA: Diagnosis not present

## 2019-06-03 DIAGNOSIS — R1312 Dysphagia, oropharyngeal phase: Secondary | ICD-10-CM | POA: Diagnosis not present

## 2019-06-03 DIAGNOSIS — I10 Essential (primary) hypertension: Secondary | ICD-10-CM | POA: Diagnosis not present

## 2019-06-03 DIAGNOSIS — S82891D Other fracture of right lower leg, subsequent encounter for closed fracture with routine healing: Secondary | ICD-10-CM | POA: Diagnosis not present

## 2019-06-03 DIAGNOSIS — D649 Anemia, unspecified: Secondary | ICD-10-CM | POA: Diagnosis not present

## 2019-06-03 DIAGNOSIS — S92492D Other fracture of left great toe, subsequent encounter for fracture with routine healing: Secondary | ICD-10-CM | POA: Diagnosis not present

## 2019-06-03 DIAGNOSIS — S82891S Other fracture of right lower leg, sequela: Secondary | ICD-10-CM | POA: Diagnosis not present

## 2019-06-03 DIAGNOSIS — E559 Vitamin D deficiency, unspecified: Secondary | ICD-10-CM | POA: Diagnosis not present

## 2019-06-03 DIAGNOSIS — S92492S Other fracture of left great toe, sequela: Secondary | ICD-10-CM | POA: Diagnosis not present

## 2019-06-03 DIAGNOSIS — D513 Other dietary vitamin B12 deficiency anemia: Secondary | ICD-10-CM | POA: Diagnosis not present

## 2019-06-03 DIAGNOSIS — M6281 Muscle weakness (generalized): Secondary | ICD-10-CM | POA: Diagnosis not present

## 2019-06-04 DIAGNOSIS — S92492D Other fracture of left great toe, subsequent encounter for fracture with routine healing: Secondary | ICD-10-CM | POA: Diagnosis not present

## 2019-06-04 DIAGNOSIS — M6281 Muscle weakness (generalized): Secondary | ICD-10-CM | POA: Diagnosis not present

## 2019-06-04 DIAGNOSIS — S92492S Other fracture of left great toe, sequela: Secondary | ICD-10-CM | POA: Diagnosis not present

## 2019-06-04 DIAGNOSIS — S82891D Other fracture of right lower leg, subsequent encounter for closed fracture with routine healing: Secondary | ICD-10-CM | POA: Diagnosis not present

## 2019-06-04 DIAGNOSIS — R1312 Dysphagia, oropharyngeal phase: Secondary | ICD-10-CM | POA: Diagnosis not present

## 2019-06-04 DIAGNOSIS — S82891S Other fracture of right lower leg, sequela: Secondary | ICD-10-CM | POA: Diagnosis not present

## 2019-06-05 DIAGNOSIS — R1312 Dysphagia, oropharyngeal phase: Secondary | ICD-10-CM | POA: Diagnosis not present

## 2019-06-05 DIAGNOSIS — F0280 Dementia in other diseases classified elsewhere without behavioral disturbance: Secondary | ICD-10-CM | POA: Diagnosis not present

## 2019-06-05 DIAGNOSIS — S92492D Other fracture of left great toe, subsequent encounter for fracture with routine healing: Secondary | ICD-10-CM | POA: Diagnosis not present

## 2019-06-05 DIAGNOSIS — E568 Deficiency of other vitamins: Secondary | ICD-10-CM | POA: Diagnosis not present

## 2019-06-05 DIAGNOSIS — S82891D Other fracture of right lower leg, subsequent encounter for closed fracture with routine healing: Secondary | ICD-10-CM | POA: Diagnosis not present

## 2019-06-05 DIAGNOSIS — S82891S Other fracture of right lower leg, sequela: Secondary | ICD-10-CM | POA: Diagnosis not present

## 2019-06-05 DIAGNOSIS — U071 COVID-19: Secondary | ICD-10-CM | POA: Diagnosis not present

## 2019-06-05 DIAGNOSIS — M6281 Muscle weakness (generalized): Secondary | ICD-10-CM | POA: Diagnosis not present

## 2019-06-05 DIAGNOSIS — S92492S Other fracture of left great toe, sequela: Secondary | ICD-10-CM | POA: Diagnosis not present

## 2019-06-06 DIAGNOSIS — S92492S Other fracture of left great toe, sequela: Secondary | ICD-10-CM | POA: Diagnosis not present

## 2019-06-06 DIAGNOSIS — R1312 Dysphagia, oropharyngeal phase: Secondary | ICD-10-CM | POA: Diagnosis not present

## 2019-06-06 DIAGNOSIS — S92492D Other fracture of left great toe, subsequent encounter for fracture with routine healing: Secondary | ICD-10-CM | POA: Diagnosis not present

## 2019-06-06 DIAGNOSIS — S82891S Other fracture of right lower leg, sequela: Secondary | ICD-10-CM | POA: Diagnosis not present

## 2019-06-06 DIAGNOSIS — S82891D Other fracture of right lower leg, subsequent encounter for closed fracture with routine healing: Secondary | ICD-10-CM | POA: Diagnosis not present

## 2019-06-06 DIAGNOSIS — M6281 Muscle weakness (generalized): Secondary | ICD-10-CM | POA: Diagnosis not present

## 2019-06-11 DIAGNOSIS — R1312 Dysphagia, oropharyngeal phase: Secondary | ICD-10-CM | POA: Diagnosis not present

## 2019-06-11 DIAGNOSIS — S92492D Other fracture of left great toe, subsequent encounter for fracture with routine healing: Secondary | ICD-10-CM | POA: Diagnosis not present

## 2019-06-11 DIAGNOSIS — G8929 Other chronic pain: Secondary | ICD-10-CM | POA: Diagnosis not present

## 2019-06-11 DIAGNOSIS — F0280 Dementia in other diseases classified elsewhere without behavioral disturbance: Secondary | ICD-10-CM | POA: Diagnosis not present

## 2019-06-11 DIAGNOSIS — S82891S Other fracture of right lower leg, sequela: Secondary | ICD-10-CM | POA: Diagnosis not present

## 2019-06-11 DIAGNOSIS — S82891D Other fracture of right lower leg, subsequent encounter for closed fracture with routine healing: Secondary | ICD-10-CM | POA: Diagnosis not present

## 2019-06-11 DIAGNOSIS — S92492S Other fracture of left great toe, sequela: Secondary | ICD-10-CM | POA: Diagnosis not present

## 2019-06-11 DIAGNOSIS — M6281 Muscle weakness (generalized): Secondary | ICD-10-CM | POA: Diagnosis not present

## 2019-06-12 DIAGNOSIS — S92492D Other fracture of left great toe, subsequent encounter for fracture with routine healing: Secondary | ICD-10-CM | POA: Diagnosis not present

## 2019-06-12 DIAGNOSIS — R1312 Dysphagia, oropharyngeal phase: Secondary | ICD-10-CM | POA: Diagnosis not present

## 2019-06-12 DIAGNOSIS — S82891D Other fracture of right lower leg, subsequent encounter for closed fracture with routine healing: Secondary | ICD-10-CM | POA: Diagnosis not present

## 2019-06-12 DIAGNOSIS — U071 COVID-19: Secondary | ICD-10-CM | POA: Diagnosis not present

## 2019-06-12 DIAGNOSIS — M6281 Muscle weakness (generalized): Secondary | ICD-10-CM | POA: Diagnosis not present

## 2019-06-12 DIAGNOSIS — S82891S Other fracture of right lower leg, sequela: Secondary | ICD-10-CM | POA: Diagnosis not present

## 2019-06-12 DIAGNOSIS — S92492S Other fracture of left great toe, sequela: Secondary | ICD-10-CM | POA: Diagnosis not present

## 2019-06-13 DIAGNOSIS — S92492S Other fracture of left great toe, sequela: Secondary | ICD-10-CM | POA: Diagnosis not present

## 2019-06-13 DIAGNOSIS — S92492D Other fracture of left great toe, subsequent encounter for fracture with routine healing: Secondary | ICD-10-CM | POA: Diagnosis not present

## 2019-06-13 DIAGNOSIS — S82891D Other fracture of right lower leg, subsequent encounter for closed fracture with routine healing: Secondary | ICD-10-CM | POA: Diagnosis not present

## 2019-06-13 DIAGNOSIS — R1312 Dysphagia, oropharyngeal phase: Secondary | ICD-10-CM | POA: Diagnosis not present

## 2019-06-13 DIAGNOSIS — S82891S Other fracture of right lower leg, sequela: Secondary | ICD-10-CM | POA: Diagnosis not present

## 2019-06-13 DIAGNOSIS — M6281 Muscle weakness (generalized): Secondary | ICD-10-CM | POA: Diagnosis not present

## 2019-06-18 DIAGNOSIS — S82891S Other fracture of right lower leg, sequela: Secondary | ICD-10-CM | POA: Diagnosis not present

## 2019-06-18 DIAGNOSIS — S92492S Other fracture of left great toe, sequela: Secondary | ICD-10-CM | POA: Diagnosis not present

## 2019-06-18 DIAGNOSIS — S82891D Other fracture of right lower leg, subsequent encounter for closed fracture with routine healing: Secondary | ICD-10-CM | POA: Diagnosis not present

## 2019-06-18 DIAGNOSIS — R1312 Dysphagia, oropharyngeal phase: Secondary | ICD-10-CM | POA: Diagnosis not present

## 2019-06-18 DIAGNOSIS — M6281 Muscle weakness (generalized): Secondary | ICD-10-CM | POA: Diagnosis not present

## 2019-06-18 DIAGNOSIS — S92492D Other fracture of left great toe, subsequent encounter for fracture with routine healing: Secondary | ICD-10-CM | POA: Diagnosis not present

## 2019-06-19 DIAGNOSIS — S82891S Other fracture of right lower leg, sequela: Secondary | ICD-10-CM | POA: Diagnosis not present

## 2019-06-19 DIAGNOSIS — U071 COVID-19: Secondary | ICD-10-CM | POA: Diagnosis not present

## 2019-06-19 DIAGNOSIS — R1312 Dysphagia, oropharyngeal phase: Secondary | ICD-10-CM | POA: Diagnosis not present

## 2019-06-19 DIAGNOSIS — M6281 Muscle weakness (generalized): Secondary | ICD-10-CM | POA: Diagnosis not present

## 2019-06-19 DIAGNOSIS — S92492S Other fracture of left great toe, sequela: Secondary | ICD-10-CM | POA: Diagnosis not present

## 2019-06-19 DIAGNOSIS — S92492D Other fracture of left great toe, subsequent encounter for fracture with routine healing: Secondary | ICD-10-CM | POA: Diagnosis not present

## 2019-06-19 DIAGNOSIS — S82891D Other fracture of right lower leg, subsequent encounter for closed fracture with routine healing: Secondary | ICD-10-CM | POA: Diagnosis not present

## 2019-06-20 DIAGNOSIS — R1312 Dysphagia, oropharyngeal phase: Secondary | ICD-10-CM | POA: Diagnosis not present

## 2019-06-20 DIAGNOSIS — S92492D Other fracture of left great toe, subsequent encounter for fracture with routine healing: Secondary | ICD-10-CM | POA: Diagnosis not present

## 2019-06-20 DIAGNOSIS — M6281 Muscle weakness (generalized): Secondary | ICD-10-CM | POA: Diagnosis not present

## 2019-06-20 DIAGNOSIS — R627 Adult failure to thrive: Secondary | ICD-10-CM | POA: Diagnosis not present

## 2019-06-20 DIAGNOSIS — R634 Abnormal weight loss: Secondary | ICD-10-CM | POA: Diagnosis not present

## 2019-06-20 DIAGNOSIS — S92492S Other fracture of left great toe, sequela: Secondary | ICD-10-CM | POA: Diagnosis not present

## 2019-06-20 DIAGNOSIS — S82891D Other fracture of right lower leg, subsequent encounter for closed fracture with routine healing: Secondary | ICD-10-CM | POA: Diagnosis not present

## 2019-06-20 DIAGNOSIS — S82891S Other fracture of right lower leg, sequela: Secondary | ICD-10-CM | POA: Diagnosis not present

## 2019-06-23 DIAGNOSIS — S82891D Other fracture of right lower leg, subsequent encounter for closed fracture with routine healing: Secondary | ICD-10-CM | POA: Diagnosis not present

## 2019-06-23 DIAGNOSIS — S92492D Other fracture of left great toe, subsequent encounter for fracture with routine healing: Secondary | ICD-10-CM | POA: Diagnosis not present

## 2019-06-23 DIAGNOSIS — R1312 Dysphagia, oropharyngeal phase: Secondary | ICD-10-CM | POA: Diagnosis not present

## 2019-06-23 DIAGNOSIS — S82891S Other fracture of right lower leg, sequela: Secondary | ICD-10-CM | POA: Diagnosis not present

## 2019-06-23 DIAGNOSIS — M6281 Muscle weakness (generalized): Secondary | ICD-10-CM | POA: Diagnosis not present

## 2019-06-23 DIAGNOSIS — S92492S Other fracture of left great toe, sequela: Secondary | ICD-10-CM | POA: Diagnosis not present

## 2019-06-24 DIAGNOSIS — R627 Adult failure to thrive: Secondary | ICD-10-CM | POA: Diagnosis not present

## 2019-06-24 DIAGNOSIS — S82891D Other fracture of right lower leg, subsequent encounter for closed fracture with routine healing: Secondary | ICD-10-CM | POA: Diagnosis not present

## 2019-06-24 DIAGNOSIS — S92492S Other fracture of left great toe, sequela: Secondary | ICD-10-CM | POA: Diagnosis not present

## 2019-06-24 DIAGNOSIS — R1312 Dysphagia, oropharyngeal phase: Secondary | ICD-10-CM | POA: Diagnosis not present

## 2019-06-24 DIAGNOSIS — S82891S Other fracture of right lower leg, sequela: Secondary | ICD-10-CM | POA: Diagnosis not present

## 2019-06-24 DIAGNOSIS — M6281 Muscle weakness (generalized): Secondary | ICD-10-CM | POA: Diagnosis not present

## 2019-06-24 DIAGNOSIS — S92492D Other fracture of left great toe, subsequent encounter for fracture with routine healing: Secondary | ICD-10-CM | POA: Diagnosis not present

## 2019-06-24 DIAGNOSIS — R633 Feeding difficulties: Secondary | ICD-10-CM | POA: Diagnosis not present

## 2019-06-24 DIAGNOSIS — R634 Abnormal weight loss: Secondary | ICD-10-CM | POA: Diagnosis not present

## 2019-06-25 DIAGNOSIS — R1312 Dysphagia, oropharyngeal phase: Secondary | ICD-10-CM | POA: Diagnosis not present

## 2019-06-25 DIAGNOSIS — S92492D Other fracture of left great toe, subsequent encounter for fracture with routine healing: Secondary | ICD-10-CM | POA: Diagnosis not present

## 2019-06-25 DIAGNOSIS — S82891S Other fracture of right lower leg, sequela: Secondary | ICD-10-CM | POA: Diagnosis not present

## 2019-06-25 DIAGNOSIS — M6281 Muscle weakness (generalized): Secondary | ICD-10-CM | POA: Diagnosis not present

## 2019-06-25 DIAGNOSIS — S92492S Other fracture of left great toe, sequela: Secondary | ICD-10-CM | POA: Diagnosis not present

## 2019-06-25 DIAGNOSIS — S82891D Other fracture of right lower leg, subsequent encounter for closed fracture with routine healing: Secondary | ICD-10-CM | POA: Diagnosis not present

## 2019-06-26 DIAGNOSIS — M6281 Muscle weakness (generalized): Secondary | ICD-10-CM | POA: Diagnosis not present

## 2019-06-26 DIAGNOSIS — R1312 Dysphagia, oropharyngeal phase: Secondary | ICD-10-CM | POA: Diagnosis not present

## 2019-06-26 DIAGNOSIS — S82891D Other fracture of right lower leg, subsequent encounter for closed fracture with routine healing: Secondary | ICD-10-CM | POA: Diagnosis not present

## 2019-06-26 DIAGNOSIS — S82891S Other fracture of right lower leg, sequela: Secondary | ICD-10-CM | POA: Diagnosis not present

## 2019-06-26 DIAGNOSIS — S92492D Other fracture of left great toe, subsequent encounter for fracture with routine healing: Secondary | ICD-10-CM | POA: Diagnosis not present

## 2019-06-26 DIAGNOSIS — S92492S Other fracture of left great toe, sequela: Secondary | ICD-10-CM | POA: Diagnosis not present

## 2019-06-27 DIAGNOSIS — M6281 Muscle weakness (generalized): Secondary | ICD-10-CM | POA: Diagnosis not present

## 2019-06-27 DIAGNOSIS — S92492D Other fracture of left great toe, subsequent encounter for fracture with routine healing: Secondary | ICD-10-CM | POA: Diagnosis not present

## 2019-06-27 DIAGNOSIS — S92492S Other fracture of left great toe, sequela: Secondary | ICD-10-CM | POA: Diagnosis not present

## 2019-06-27 DIAGNOSIS — S82891D Other fracture of right lower leg, subsequent encounter for closed fracture with routine healing: Secondary | ICD-10-CM | POA: Diagnosis not present

## 2019-06-27 DIAGNOSIS — S82891S Other fracture of right lower leg, sequela: Secondary | ICD-10-CM | POA: Diagnosis not present

## 2019-06-27 DIAGNOSIS — R1312 Dysphagia, oropharyngeal phase: Secondary | ICD-10-CM | POA: Diagnosis not present

## 2019-06-28 DIAGNOSIS — S82891S Other fracture of right lower leg, sequela: Secondary | ICD-10-CM | POA: Diagnosis not present

## 2019-06-28 DIAGNOSIS — R1312 Dysphagia, oropharyngeal phase: Secondary | ICD-10-CM | POA: Diagnosis not present

## 2019-06-28 DIAGNOSIS — M6281 Muscle weakness (generalized): Secondary | ICD-10-CM | POA: Diagnosis not present

## 2019-06-28 DIAGNOSIS — S92492S Other fracture of left great toe, sequela: Secondary | ICD-10-CM | POA: Diagnosis not present

## 2019-06-28 DIAGNOSIS — S82891D Other fracture of right lower leg, subsequent encounter for closed fracture with routine healing: Secondary | ICD-10-CM | POA: Diagnosis not present

## 2019-06-28 DIAGNOSIS — S92492D Other fracture of left great toe, subsequent encounter for fracture with routine healing: Secondary | ICD-10-CM | POA: Diagnosis not present

## 2019-06-29 DIAGNOSIS — S92492D Other fracture of left great toe, subsequent encounter for fracture with routine healing: Secondary | ICD-10-CM | POA: Diagnosis not present

## 2019-06-29 DIAGNOSIS — S82891S Other fracture of right lower leg, sequela: Secondary | ICD-10-CM | POA: Diagnosis not present

## 2019-06-29 DIAGNOSIS — S82891D Other fracture of right lower leg, subsequent encounter for closed fracture with routine healing: Secondary | ICD-10-CM | POA: Diagnosis not present

## 2019-06-29 DIAGNOSIS — R1312 Dysphagia, oropharyngeal phase: Secondary | ICD-10-CM | POA: Diagnosis not present

## 2019-06-29 DIAGNOSIS — M6281 Muscle weakness (generalized): Secondary | ICD-10-CM | POA: Diagnosis not present

## 2019-06-29 DIAGNOSIS — S92492S Other fracture of left great toe, sequela: Secondary | ICD-10-CM | POA: Diagnosis not present

## 2019-07-02 DIAGNOSIS — R278 Other lack of coordination: Secondary | ICD-10-CM | POA: Diagnosis not present

## 2019-07-02 DIAGNOSIS — S92492D Other fracture of left great toe, subsequent encounter for fracture with routine healing: Secondary | ICD-10-CM | POA: Diagnosis not present

## 2019-07-02 DIAGNOSIS — F0390 Unspecified dementia without behavioral disturbance: Secondary | ICD-10-CM | POA: Diagnosis not present

## 2019-07-02 DIAGNOSIS — M6281 Muscle weakness (generalized): Secondary | ICD-10-CM | POA: Diagnosis not present

## 2019-07-02 DIAGNOSIS — R293 Abnormal posture: Secondary | ICD-10-CM | POA: Diagnosis not present

## 2019-07-02 DIAGNOSIS — S92492S Other fracture of left great toe, sequela: Secondary | ICD-10-CM | POA: Diagnosis not present

## 2019-07-02 DIAGNOSIS — R2681 Unsteadiness on feet: Secondary | ICD-10-CM | POA: Diagnosis not present

## 2019-07-02 DIAGNOSIS — R2689 Other abnormalities of gait and mobility: Secondary | ICD-10-CM | POA: Diagnosis not present

## 2019-07-02 DIAGNOSIS — S82891S Other fracture of right lower leg, sequela: Secondary | ICD-10-CM | POA: Diagnosis not present

## 2019-07-02 DIAGNOSIS — S82891D Other fracture of right lower leg, subsequent encounter for closed fracture with routine healing: Secondary | ICD-10-CM | POA: Diagnosis not present

## 2019-07-02 DIAGNOSIS — R1312 Dysphagia, oropharyngeal phase: Secondary | ICD-10-CM | POA: Diagnosis not present

## 2019-07-02 DIAGNOSIS — R41841 Cognitive communication deficit: Secondary | ICD-10-CM | POA: Diagnosis not present

## 2019-07-03 DIAGNOSIS — M6281 Muscle weakness (generalized): Secondary | ICD-10-CM | POA: Diagnosis not present

## 2019-07-03 DIAGNOSIS — S92492D Other fracture of left great toe, subsequent encounter for fracture with routine healing: Secondary | ICD-10-CM | POA: Diagnosis not present

## 2019-07-03 DIAGNOSIS — R1312 Dysphagia, oropharyngeal phase: Secondary | ICD-10-CM | POA: Diagnosis not present

## 2019-07-03 DIAGNOSIS — S92492S Other fracture of left great toe, sequela: Secondary | ICD-10-CM | POA: Diagnosis not present

## 2019-07-03 DIAGNOSIS — S82891D Other fracture of right lower leg, subsequent encounter for closed fracture with routine healing: Secondary | ICD-10-CM | POA: Diagnosis not present

## 2019-07-03 DIAGNOSIS — S82891S Other fracture of right lower leg, sequela: Secondary | ICD-10-CM | POA: Diagnosis not present

## 2019-07-04 DIAGNOSIS — S92492S Other fracture of left great toe, sequela: Secondary | ICD-10-CM | POA: Diagnosis not present

## 2019-07-04 DIAGNOSIS — S92492D Other fracture of left great toe, subsequent encounter for fracture with routine healing: Secondary | ICD-10-CM | POA: Diagnosis not present

## 2019-07-04 DIAGNOSIS — M6281 Muscle weakness (generalized): Secondary | ICD-10-CM | POA: Diagnosis not present

## 2019-07-04 DIAGNOSIS — R1312 Dysphagia, oropharyngeal phase: Secondary | ICD-10-CM | POA: Diagnosis not present

## 2019-07-04 DIAGNOSIS — S82891D Other fracture of right lower leg, subsequent encounter for closed fracture with routine healing: Secondary | ICD-10-CM | POA: Diagnosis not present

## 2019-07-04 DIAGNOSIS — S82891S Other fracture of right lower leg, sequela: Secondary | ICD-10-CM | POA: Diagnosis not present

## 2019-07-05 DIAGNOSIS — R1312 Dysphagia, oropharyngeal phase: Secondary | ICD-10-CM | POA: Diagnosis not present

## 2019-07-05 DIAGNOSIS — S92492S Other fracture of left great toe, sequela: Secondary | ICD-10-CM | POA: Diagnosis not present

## 2019-07-05 DIAGNOSIS — S92492D Other fracture of left great toe, subsequent encounter for fracture with routine healing: Secondary | ICD-10-CM | POA: Diagnosis not present

## 2019-07-05 DIAGNOSIS — M6281 Muscle weakness (generalized): Secondary | ICD-10-CM | POA: Diagnosis not present

## 2019-07-05 DIAGNOSIS — S82891S Other fracture of right lower leg, sequela: Secondary | ICD-10-CM | POA: Diagnosis not present

## 2019-07-05 DIAGNOSIS — S82891D Other fracture of right lower leg, subsequent encounter for closed fracture with routine healing: Secondary | ICD-10-CM | POA: Diagnosis not present

## 2019-07-06 DIAGNOSIS — S82891D Other fracture of right lower leg, subsequent encounter for closed fracture with routine healing: Secondary | ICD-10-CM | POA: Diagnosis not present

## 2019-07-06 DIAGNOSIS — M6281 Muscle weakness (generalized): Secondary | ICD-10-CM | POA: Diagnosis not present

## 2019-07-06 DIAGNOSIS — R1312 Dysphagia, oropharyngeal phase: Secondary | ICD-10-CM | POA: Diagnosis not present

## 2019-07-06 DIAGNOSIS — S92492S Other fracture of left great toe, sequela: Secondary | ICD-10-CM | POA: Diagnosis not present

## 2019-07-06 DIAGNOSIS — S92492D Other fracture of left great toe, subsequent encounter for fracture with routine healing: Secondary | ICD-10-CM | POA: Diagnosis not present

## 2019-07-06 DIAGNOSIS — S82891S Other fracture of right lower leg, sequela: Secondary | ICD-10-CM | POA: Diagnosis not present

## 2019-07-09 DIAGNOSIS — S92492D Other fracture of left great toe, subsequent encounter for fracture with routine healing: Secondary | ICD-10-CM | POA: Diagnosis not present

## 2019-07-09 DIAGNOSIS — S82891D Other fracture of right lower leg, subsequent encounter for closed fracture with routine healing: Secondary | ICD-10-CM | POA: Diagnosis not present

## 2019-07-09 DIAGNOSIS — R1312 Dysphagia, oropharyngeal phase: Secondary | ICD-10-CM | POA: Diagnosis not present

## 2019-07-09 DIAGNOSIS — S82891S Other fracture of right lower leg, sequela: Secondary | ICD-10-CM | POA: Diagnosis not present

## 2019-07-09 DIAGNOSIS — M6281 Muscle weakness (generalized): Secondary | ICD-10-CM | POA: Diagnosis not present

## 2019-07-09 DIAGNOSIS — S92492S Other fracture of left great toe, sequela: Secondary | ICD-10-CM | POA: Diagnosis not present

## 2019-07-11 DIAGNOSIS — L89153 Pressure ulcer of sacral region, stage 3: Secondary | ICD-10-CM | POA: Diagnosis not present

## 2019-07-12 DIAGNOSIS — U071 COVID-19: Secondary | ICD-10-CM | POA: Diagnosis not present

## 2019-07-17 DIAGNOSIS — Z23 Encounter for immunization: Secondary | ICD-10-CM | POA: Diagnosis not present

## 2019-07-18 DIAGNOSIS — U071 COVID-19: Secondary | ICD-10-CM | POA: Diagnosis not present

## 2019-07-19 DIAGNOSIS — S92492S Other fracture of left great toe, sequela: Secondary | ICD-10-CM | POA: Diagnosis not present

## 2019-07-19 DIAGNOSIS — S92492D Other fracture of left great toe, subsequent encounter for fracture with routine healing: Secondary | ICD-10-CM | POA: Diagnosis not present

## 2019-07-19 DIAGNOSIS — S82891D Other fracture of right lower leg, subsequent encounter for closed fracture with routine healing: Secondary | ICD-10-CM | POA: Diagnosis not present

## 2019-07-19 DIAGNOSIS — S82891S Other fracture of right lower leg, sequela: Secondary | ICD-10-CM | POA: Diagnosis not present

## 2019-07-19 DIAGNOSIS — M6281 Muscle weakness (generalized): Secondary | ICD-10-CM | POA: Diagnosis not present

## 2019-07-19 DIAGNOSIS — R1312 Dysphagia, oropharyngeal phase: Secondary | ICD-10-CM | POA: Diagnosis not present

## 2019-07-22 DIAGNOSIS — R1312 Dysphagia, oropharyngeal phase: Secondary | ICD-10-CM | POA: Diagnosis not present

## 2019-07-22 DIAGNOSIS — M6281 Muscle weakness (generalized): Secondary | ICD-10-CM | POA: Diagnosis not present

## 2019-07-22 DIAGNOSIS — S92492D Other fracture of left great toe, subsequent encounter for fracture with routine healing: Secondary | ICD-10-CM | POA: Diagnosis not present

## 2019-07-22 DIAGNOSIS — S92492S Other fracture of left great toe, sequela: Secondary | ICD-10-CM | POA: Diagnosis not present

## 2019-07-22 DIAGNOSIS — S82891D Other fracture of right lower leg, subsequent encounter for closed fracture with routine healing: Secondary | ICD-10-CM | POA: Diagnosis not present

## 2019-07-22 DIAGNOSIS — S82891S Other fracture of right lower leg, sequela: Secondary | ICD-10-CM | POA: Diagnosis not present

## 2019-07-23 DIAGNOSIS — S92492S Other fracture of left great toe, sequela: Secondary | ICD-10-CM | POA: Diagnosis not present

## 2019-07-23 DIAGNOSIS — S82891D Other fracture of right lower leg, subsequent encounter for closed fracture with routine healing: Secondary | ICD-10-CM | POA: Diagnosis not present

## 2019-07-23 DIAGNOSIS — M6281 Muscle weakness (generalized): Secondary | ICD-10-CM | POA: Diagnosis not present

## 2019-07-23 DIAGNOSIS — S82891S Other fracture of right lower leg, sequela: Secondary | ICD-10-CM | POA: Diagnosis not present

## 2019-07-23 DIAGNOSIS — R1312 Dysphagia, oropharyngeal phase: Secondary | ICD-10-CM | POA: Diagnosis not present

## 2019-07-23 DIAGNOSIS — S92492D Other fracture of left great toe, subsequent encounter for fracture with routine healing: Secondary | ICD-10-CM | POA: Diagnosis not present

## 2019-07-25 DIAGNOSIS — U071 COVID-19: Secondary | ICD-10-CM | POA: Diagnosis not present

## 2019-07-26 DIAGNOSIS — S82891D Other fracture of right lower leg, subsequent encounter for closed fracture with routine healing: Secondary | ICD-10-CM | POA: Diagnosis not present

## 2019-07-26 DIAGNOSIS — S82891S Other fracture of right lower leg, sequela: Secondary | ICD-10-CM | POA: Diagnosis not present

## 2019-07-26 DIAGNOSIS — M6281 Muscle weakness (generalized): Secondary | ICD-10-CM | POA: Diagnosis not present

## 2019-07-26 DIAGNOSIS — S92492D Other fracture of left great toe, subsequent encounter for fracture with routine healing: Secondary | ICD-10-CM | POA: Diagnosis not present

## 2019-07-26 DIAGNOSIS — R1312 Dysphagia, oropharyngeal phase: Secondary | ICD-10-CM | POA: Diagnosis not present

## 2019-07-26 DIAGNOSIS — S92492S Other fracture of left great toe, sequela: Secondary | ICD-10-CM | POA: Diagnosis not present

## 2019-07-30 DIAGNOSIS — R2689 Other abnormalities of gait and mobility: Secondary | ICD-10-CM | POA: Diagnosis not present

## 2019-07-30 DIAGNOSIS — R41841 Cognitive communication deficit: Secondary | ICD-10-CM | POA: Diagnosis not present

## 2019-07-30 DIAGNOSIS — F0390 Unspecified dementia without behavioral disturbance: Secondary | ICD-10-CM | POA: Diagnosis not present

## 2019-07-30 DIAGNOSIS — R2681 Unsteadiness on feet: Secondary | ICD-10-CM | POA: Diagnosis not present

## 2019-07-30 DIAGNOSIS — R1312 Dysphagia, oropharyngeal phase: Secondary | ICD-10-CM | POA: Diagnosis not present

## 2019-07-30 DIAGNOSIS — S92492D Other fracture of left great toe, subsequent encounter for fracture with routine healing: Secondary | ICD-10-CM | POA: Diagnosis not present

## 2019-07-30 DIAGNOSIS — S82891S Other fracture of right lower leg, sequela: Secondary | ICD-10-CM | POA: Diagnosis not present

## 2019-07-30 DIAGNOSIS — R278 Other lack of coordination: Secondary | ICD-10-CM | POA: Diagnosis not present

## 2019-07-30 DIAGNOSIS — M6281 Muscle weakness (generalized): Secondary | ICD-10-CM | POA: Diagnosis not present

## 2019-07-30 DIAGNOSIS — S92492S Other fracture of left great toe, sequela: Secondary | ICD-10-CM | POA: Diagnosis not present

## 2019-07-30 DIAGNOSIS — R293 Abnormal posture: Secondary | ICD-10-CM | POA: Diagnosis not present

## 2019-07-30 DIAGNOSIS — S82891D Other fracture of right lower leg, subsequent encounter for closed fracture with routine healing: Secondary | ICD-10-CM | POA: Diagnosis not present

## 2019-07-31 DIAGNOSIS — S82891D Other fracture of right lower leg, subsequent encounter for closed fracture with routine healing: Secondary | ICD-10-CM | POA: Diagnosis not present

## 2019-07-31 DIAGNOSIS — S92492D Other fracture of left great toe, subsequent encounter for fracture with routine healing: Secondary | ICD-10-CM | POA: Diagnosis not present

## 2019-07-31 DIAGNOSIS — S92492S Other fracture of left great toe, sequela: Secondary | ICD-10-CM | POA: Diagnosis not present

## 2019-07-31 DIAGNOSIS — S82891S Other fracture of right lower leg, sequela: Secondary | ICD-10-CM | POA: Diagnosis not present

## 2019-07-31 DIAGNOSIS — M6281 Muscle weakness (generalized): Secondary | ICD-10-CM | POA: Diagnosis not present

## 2019-07-31 DIAGNOSIS — R1312 Dysphagia, oropharyngeal phase: Secondary | ICD-10-CM | POA: Diagnosis not present

## 2019-08-01 DIAGNOSIS — U071 COVID-19: Secondary | ICD-10-CM | POA: Diagnosis not present

## 2019-08-09 DIAGNOSIS — U071 COVID-19: Secondary | ICD-10-CM | POA: Diagnosis not present

## 2019-08-14 DIAGNOSIS — G8929 Other chronic pain: Secondary | ICD-10-CM | POA: Diagnosis not present

## 2019-08-16 DIAGNOSIS — U071 COVID-19: Secondary | ICD-10-CM | POA: Diagnosis not present

## 2019-08-28 DIAGNOSIS — R4189 Other symptoms and signs involving cognitive functions and awareness: Secondary | ICD-10-CM | POA: Diagnosis not present

## 2019-08-28 DIAGNOSIS — G8929 Other chronic pain: Secondary | ICD-10-CM | POA: Diagnosis not present

## 2019-08-29 DIAGNOSIS — G8929 Other chronic pain: Secondary | ICD-10-CM | POA: Diagnosis not present

## 2019-08-29 DIAGNOSIS — R532 Functional quadriplegia: Secondary | ICD-10-CM | POA: Diagnosis not present

## 2019-08-29 DIAGNOSIS — F39 Unspecified mood [affective] disorder: Secondary | ICD-10-CM | POA: Diagnosis not present

## 2019-08-29 DIAGNOSIS — F0280 Dementia in other diseases classified elsewhere without behavioral disturbance: Secondary | ICD-10-CM | POA: Diagnosis not present

## 2019-08-29 DIAGNOSIS — I10 Essential (primary) hypertension: Secondary | ICD-10-CM | POA: Diagnosis not present

## 2019-08-29 DIAGNOSIS — E568 Deficiency of other vitamins: Secondary | ICD-10-CM | POA: Diagnosis not present

## 2019-08-29 DIAGNOSIS — R2689 Other abnormalities of gait and mobility: Secondary | ICD-10-CM | POA: Diagnosis not present

## 2019-09-02 DIAGNOSIS — R532 Functional quadriplegia: Secondary | ICD-10-CM | POA: Diagnosis not present

## 2019-09-02 DIAGNOSIS — R600 Localized edema: Secondary | ICD-10-CM | POA: Diagnosis not present

## 2019-09-02 DIAGNOSIS — J3489 Other specified disorders of nose and nasal sinuses: Secondary | ICD-10-CM | POA: Diagnosis not present

## 2019-09-03 DIAGNOSIS — R600 Localized edema: Secondary | ICD-10-CM | POA: Diagnosis not present

## 2019-09-03 DIAGNOSIS — R52 Pain, unspecified: Secondary | ICD-10-CM | POA: Diagnosis not present

## 2019-09-03 DIAGNOSIS — J328 Other chronic sinusitis: Secondary | ICD-10-CM | POA: Diagnosis not present

## 2019-09-03 DIAGNOSIS — L538 Other specified erythematous conditions: Secondary | ICD-10-CM | POA: Diagnosis not present

## 2019-09-03 DIAGNOSIS — J3489 Other specified disorders of nose and nasal sinuses: Secondary | ICD-10-CM | POA: Diagnosis not present

## 2019-09-04 DIAGNOSIS — S82891D Other fracture of right lower leg, subsequent encounter for closed fracture with routine healing: Secondary | ICD-10-CM | POA: Diagnosis not present

## 2019-09-04 DIAGNOSIS — M6281 Muscle weakness (generalized): Secondary | ICD-10-CM | POA: Diagnosis not present

## 2019-09-04 DIAGNOSIS — R293 Abnormal posture: Secondary | ICD-10-CM | POA: Diagnosis not present

## 2019-09-04 DIAGNOSIS — S92492S Other fracture of left great toe, sequela: Secondary | ICD-10-CM | POA: Diagnosis not present

## 2019-09-04 DIAGNOSIS — F0390 Unspecified dementia without behavioral disturbance: Secondary | ICD-10-CM | POA: Diagnosis not present

## 2019-09-04 DIAGNOSIS — R1312 Dysphagia, oropharyngeal phase: Secondary | ICD-10-CM | POA: Diagnosis not present

## 2019-09-04 DIAGNOSIS — R2681 Unsteadiness on feet: Secondary | ICD-10-CM | POA: Diagnosis not present

## 2019-09-04 DIAGNOSIS — S82891S Other fracture of right lower leg, sequela: Secondary | ICD-10-CM | POA: Diagnosis not present

## 2019-09-04 DIAGNOSIS — R278 Other lack of coordination: Secondary | ICD-10-CM | POA: Diagnosis not present

## 2019-09-04 DIAGNOSIS — R41841 Cognitive communication deficit: Secondary | ICD-10-CM | POA: Diagnosis not present

## 2019-09-04 DIAGNOSIS — S92492D Other fracture of left great toe, subsequent encounter for fracture with routine healing: Secondary | ICD-10-CM | POA: Diagnosis not present

## 2019-09-04 DIAGNOSIS — R2689 Other abnormalities of gait and mobility: Secondary | ICD-10-CM | POA: Diagnosis not present

## 2019-09-05 DIAGNOSIS — S82891D Other fracture of right lower leg, subsequent encounter for closed fracture with routine healing: Secondary | ICD-10-CM | POA: Diagnosis not present

## 2019-09-05 DIAGNOSIS — S92492S Other fracture of left great toe, sequela: Secondary | ICD-10-CM | POA: Diagnosis not present

## 2019-09-05 DIAGNOSIS — S92492D Other fracture of left great toe, subsequent encounter for fracture with routine healing: Secondary | ICD-10-CM | POA: Diagnosis not present

## 2019-09-05 DIAGNOSIS — M6281 Muscle weakness (generalized): Secondary | ICD-10-CM | POA: Diagnosis not present

## 2019-09-05 DIAGNOSIS — S82891S Other fracture of right lower leg, sequela: Secondary | ICD-10-CM | POA: Diagnosis not present

## 2019-09-05 DIAGNOSIS — R1312 Dysphagia, oropharyngeal phase: Secondary | ICD-10-CM | POA: Diagnosis not present

## 2019-09-06 DIAGNOSIS — S92492D Other fracture of left great toe, subsequent encounter for fracture with routine healing: Secondary | ICD-10-CM | POA: Diagnosis not present

## 2019-09-06 DIAGNOSIS — S82891D Other fracture of right lower leg, subsequent encounter for closed fracture with routine healing: Secondary | ICD-10-CM | POA: Diagnosis not present

## 2019-09-06 DIAGNOSIS — M6281 Muscle weakness (generalized): Secondary | ICD-10-CM | POA: Diagnosis not present

## 2019-09-06 DIAGNOSIS — R1312 Dysphagia, oropharyngeal phase: Secondary | ICD-10-CM | POA: Diagnosis not present

## 2019-09-06 DIAGNOSIS — S82891S Other fracture of right lower leg, sequela: Secondary | ICD-10-CM | POA: Diagnosis not present

## 2019-09-06 DIAGNOSIS — S92492S Other fracture of left great toe, sequela: Secondary | ICD-10-CM | POA: Diagnosis not present

## 2019-09-09 DIAGNOSIS — R1312 Dysphagia, oropharyngeal phase: Secondary | ICD-10-CM | POA: Diagnosis not present

## 2019-09-09 DIAGNOSIS — S92492S Other fracture of left great toe, sequela: Secondary | ICD-10-CM | POA: Diagnosis not present

## 2019-09-09 DIAGNOSIS — S92492D Other fracture of left great toe, subsequent encounter for fracture with routine healing: Secondary | ICD-10-CM | POA: Diagnosis not present

## 2019-09-09 DIAGNOSIS — S82891S Other fracture of right lower leg, sequela: Secondary | ICD-10-CM | POA: Diagnosis not present

## 2019-09-09 DIAGNOSIS — S82891D Other fracture of right lower leg, subsequent encounter for closed fracture with routine healing: Secondary | ICD-10-CM | POA: Diagnosis not present

## 2019-09-09 DIAGNOSIS — M6281 Muscle weakness (generalized): Secondary | ICD-10-CM | POA: Diagnosis not present

## 2019-09-10 DIAGNOSIS — S92492S Other fracture of left great toe, sequela: Secondary | ICD-10-CM | POA: Diagnosis not present

## 2019-09-10 DIAGNOSIS — S92492D Other fracture of left great toe, subsequent encounter for fracture with routine healing: Secondary | ICD-10-CM | POA: Diagnosis not present

## 2019-09-10 DIAGNOSIS — M6281 Muscle weakness (generalized): Secondary | ICD-10-CM | POA: Diagnosis not present

## 2019-09-10 DIAGNOSIS — S82891S Other fracture of right lower leg, sequela: Secondary | ICD-10-CM | POA: Diagnosis not present

## 2019-09-10 DIAGNOSIS — R1312 Dysphagia, oropharyngeal phase: Secondary | ICD-10-CM | POA: Diagnosis not present

## 2019-09-10 DIAGNOSIS — S82891D Other fracture of right lower leg, subsequent encounter for closed fracture with routine healing: Secondary | ICD-10-CM | POA: Diagnosis not present

## 2019-09-11 DIAGNOSIS — R1312 Dysphagia, oropharyngeal phase: Secondary | ICD-10-CM | POA: Diagnosis not present

## 2019-09-11 DIAGNOSIS — S92492D Other fracture of left great toe, subsequent encounter for fracture with routine healing: Secondary | ICD-10-CM | POA: Diagnosis not present

## 2019-09-11 DIAGNOSIS — S82891S Other fracture of right lower leg, sequela: Secondary | ICD-10-CM | POA: Diagnosis not present

## 2019-09-11 DIAGNOSIS — M6281 Muscle weakness (generalized): Secondary | ICD-10-CM | POA: Diagnosis not present

## 2019-09-11 DIAGNOSIS — S82891D Other fracture of right lower leg, subsequent encounter for closed fracture with routine healing: Secondary | ICD-10-CM | POA: Diagnosis not present

## 2019-09-11 DIAGNOSIS — S92492S Other fracture of left great toe, sequela: Secondary | ICD-10-CM | POA: Diagnosis not present

## 2019-09-12 DIAGNOSIS — R1312 Dysphagia, oropharyngeal phase: Secondary | ICD-10-CM | POA: Diagnosis not present

## 2019-09-12 DIAGNOSIS — S92492D Other fracture of left great toe, subsequent encounter for fracture with routine healing: Secondary | ICD-10-CM | POA: Diagnosis not present

## 2019-09-12 DIAGNOSIS — S92492S Other fracture of left great toe, sequela: Secondary | ICD-10-CM | POA: Diagnosis not present

## 2019-09-12 DIAGNOSIS — S82891D Other fracture of right lower leg, subsequent encounter for closed fracture with routine healing: Secondary | ICD-10-CM | POA: Diagnosis not present

## 2019-09-12 DIAGNOSIS — M6281 Muscle weakness (generalized): Secondary | ICD-10-CM | POA: Diagnosis not present

## 2019-09-12 DIAGNOSIS — S82891S Other fracture of right lower leg, sequela: Secondary | ICD-10-CM | POA: Diagnosis not present

## 2019-09-13 DIAGNOSIS — S82891S Other fracture of right lower leg, sequela: Secondary | ICD-10-CM | POA: Diagnosis not present

## 2019-09-13 DIAGNOSIS — S92492D Other fracture of left great toe, subsequent encounter for fracture with routine healing: Secondary | ICD-10-CM | POA: Diagnosis not present

## 2019-09-13 DIAGNOSIS — M6281 Muscle weakness (generalized): Secondary | ICD-10-CM | POA: Diagnosis not present

## 2019-09-13 DIAGNOSIS — J189 Pneumonia, unspecified organism: Secondary | ICD-10-CM | POA: Diagnosis not present

## 2019-09-13 DIAGNOSIS — S92492S Other fracture of left great toe, sequela: Secondary | ICD-10-CM | POA: Diagnosis not present

## 2019-09-13 DIAGNOSIS — R05 Cough: Secondary | ICD-10-CM | POA: Diagnosis not present

## 2019-09-13 DIAGNOSIS — S82891D Other fracture of right lower leg, subsequent encounter for closed fracture with routine healing: Secondary | ICD-10-CM | POA: Diagnosis not present

## 2019-09-13 DIAGNOSIS — R0689 Other abnormalities of breathing: Secondary | ICD-10-CM | POA: Diagnosis not present

## 2019-09-13 DIAGNOSIS — R1312 Dysphagia, oropharyngeal phase: Secondary | ICD-10-CM | POA: Diagnosis not present

## 2019-09-16 DIAGNOSIS — Z20822 Contact with and (suspected) exposure to covid-19: Secondary | ICD-10-CM | POA: Diagnosis not present

## 2019-09-23 DIAGNOSIS — U071 COVID-19: Secondary | ICD-10-CM | POA: Diagnosis not present

## 2019-11-07 DIAGNOSIS — J42 Unspecified chronic bronchitis: Secondary | ICD-10-CM | POA: Diagnosis not present

## 2019-11-07 DIAGNOSIS — F39 Unspecified mood [affective] disorder: Secondary | ICD-10-CM | POA: Diagnosis not present

## 2019-11-07 DIAGNOSIS — I10 Essential (primary) hypertension: Secondary | ICD-10-CM | POA: Diagnosis not present

## 2019-11-07 DIAGNOSIS — G4089 Other seizures: Secondary | ICD-10-CM | POA: Diagnosis not present

## 2019-11-07 DIAGNOSIS — G8929 Other chronic pain: Secondary | ICD-10-CM | POA: Diagnosis not present

## 2019-11-07 DIAGNOSIS — I2510 Atherosclerotic heart disease of native coronary artery without angina pectoris: Secondary | ICD-10-CM | POA: Diagnosis not present

## 2019-11-07 DIAGNOSIS — R238 Other skin changes: Secondary | ICD-10-CM | POA: Diagnosis not present

## 2019-11-07 DIAGNOSIS — F0280 Dementia in other diseases classified elsewhere without behavioral disturbance: Secondary | ICD-10-CM | POA: Diagnosis not present

## 2019-12-02 DIAGNOSIS — F0391 Unspecified dementia with behavioral disturbance: Secondary | ICD-10-CM | POA: Diagnosis not present

## 2019-12-02 DIAGNOSIS — F329 Major depressive disorder, single episode, unspecified: Secondary | ICD-10-CM | POA: Diagnosis not present

## 2019-12-02 DIAGNOSIS — F419 Anxiety disorder, unspecified: Secondary | ICD-10-CM | POA: Diagnosis not present

## 2019-12-04 DIAGNOSIS — M24572 Contracture, left ankle: Secondary | ICD-10-CM | POA: Diagnosis not present

## 2019-12-04 DIAGNOSIS — M6281 Muscle weakness (generalized): Secondary | ICD-10-CM | POA: Diagnosis not present

## 2019-12-04 DIAGNOSIS — R293 Abnormal posture: Secondary | ICD-10-CM | POA: Diagnosis not present

## 2019-12-04 DIAGNOSIS — R1312 Dysphagia, oropharyngeal phase: Secondary | ICD-10-CM | POA: Diagnosis not present

## 2019-12-04 DIAGNOSIS — S82891D Other fracture of right lower leg, subsequent encounter for closed fracture with routine healing: Secondary | ICD-10-CM | POA: Diagnosis not present

## 2019-12-04 DIAGNOSIS — R278 Other lack of coordination: Secondary | ICD-10-CM | POA: Diagnosis not present

## 2019-12-04 DIAGNOSIS — R2681 Unsteadiness on feet: Secondary | ICD-10-CM | POA: Diagnosis not present

## 2019-12-04 DIAGNOSIS — M24571 Contracture, right ankle: Secondary | ICD-10-CM | POA: Diagnosis not present

## 2019-12-04 DIAGNOSIS — R41841 Cognitive communication deficit: Secondary | ICD-10-CM | POA: Diagnosis not present

## 2019-12-04 DIAGNOSIS — S82891S Other fracture of right lower leg, sequela: Secondary | ICD-10-CM | POA: Diagnosis not present

## 2019-12-04 DIAGNOSIS — S92492D Other fracture of left great toe, subsequent encounter for fracture with routine healing: Secondary | ICD-10-CM | POA: Diagnosis not present

## 2019-12-04 DIAGNOSIS — S92492S Other fracture of left great toe, sequela: Secondary | ICD-10-CM | POA: Diagnosis not present

## 2019-12-04 DIAGNOSIS — R2689 Other abnormalities of gait and mobility: Secondary | ICD-10-CM | POA: Diagnosis not present

## 2019-12-04 DIAGNOSIS — F0390 Unspecified dementia without behavioral disturbance: Secondary | ICD-10-CM | POA: Diagnosis not present

## 2019-12-05 DIAGNOSIS — S82891D Other fracture of right lower leg, subsequent encounter for closed fracture with routine healing: Secondary | ICD-10-CM | POA: Diagnosis not present

## 2019-12-05 DIAGNOSIS — S92492D Other fracture of left great toe, subsequent encounter for fracture with routine healing: Secondary | ICD-10-CM | POA: Diagnosis not present

## 2019-12-05 DIAGNOSIS — M24572 Contracture, left ankle: Secondary | ICD-10-CM | POA: Diagnosis not present

## 2019-12-05 DIAGNOSIS — S92492S Other fracture of left great toe, sequela: Secondary | ICD-10-CM | POA: Diagnosis not present

## 2019-12-05 DIAGNOSIS — M6281 Muscle weakness (generalized): Secondary | ICD-10-CM | POA: Diagnosis not present

## 2019-12-05 DIAGNOSIS — S82891S Other fracture of right lower leg, sequela: Secondary | ICD-10-CM | POA: Diagnosis not present

## 2019-12-06 DIAGNOSIS — S82891D Other fracture of right lower leg, subsequent encounter for closed fracture with routine healing: Secondary | ICD-10-CM | POA: Diagnosis not present

## 2019-12-06 DIAGNOSIS — S92492D Other fracture of left great toe, subsequent encounter for fracture with routine healing: Secondary | ICD-10-CM | POA: Diagnosis not present

## 2019-12-06 DIAGNOSIS — M6281 Muscle weakness (generalized): Secondary | ICD-10-CM | POA: Diagnosis not present

## 2019-12-06 DIAGNOSIS — S92492S Other fracture of left great toe, sequela: Secondary | ICD-10-CM | POA: Diagnosis not present

## 2019-12-06 DIAGNOSIS — M24572 Contracture, left ankle: Secondary | ICD-10-CM | POA: Diagnosis not present

## 2019-12-06 DIAGNOSIS — S82891S Other fracture of right lower leg, sequela: Secondary | ICD-10-CM | POA: Diagnosis not present

## 2019-12-09 DIAGNOSIS — S92492S Other fracture of left great toe, sequela: Secondary | ICD-10-CM | POA: Diagnosis not present

## 2019-12-09 DIAGNOSIS — M24572 Contracture, left ankle: Secondary | ICD-10-CM | POA: Diagnosis not present

## 2019-12-09 DIAGNOSIS — M6281 Muscle weakness (generalized): Secondary | ICD-10-CM | POA: Diagnosis not present

## 2019-12-09 DIAGNOSIS — S92492D Other fracture of left great toe, subsequent encounter for fracture with routine healing: Secondary | ICD-10-CM | POA: Diagnosis not present

## 2019-12-09 DIAGNOSIS — S82891D Other fracture of right lower leg, subsequent encounter for closed fracture with routine healing: Secondary | ICD-10-CM | POA: Diagnosis not present

## 2019-12-09 DIAGNOSIS — S82891S Other fracture of right lower leg, sequela: Secondary | ICD-10-CM | POA: Diagnosis not present

## 2019-12-10 DIAGNOSIS — M6281 Muscle weakness (generalized): Secondary | ICD-10-CM | POA: Diagnosis not present

## 2019-12-10 DIAGNOSIS — S82891S Other fracture of right lower leg, sequela: Secondary | ICD-10-CM | POA: Diagnosis not present

## 2019-12-10 DIAGNOSIS — M24572 Contracture, left ankle: Secondary | ICD-10-CM | POA: Diagnosis not present

## 2019-12-10 DIAGNOSIS — S92492D Other fracture of left great toe, subsequent encounter for fracture with routine healing: Secondary | ICD-10-CM | POA: Diagnosis not present

## 2019-12-10 DIAGNOSIS — S82891D Other fracture of right lower leg, subsequent encounter for closed fracture with routine healing: Secondary | ICD-10-CM | POA: Diagnosis not present

## 2019-12-10 DIAGNOSIS — S92492S Other fracture of left great toe, sequela: Secondary | ICD-10-CM | POA: Diagnosis not present

## 2019-12-11 DIAGNOSIS — S82891S Other fracture of right lower leg, sequela: Secondary | ICD-10-CM | POA: Diagnosis not present

## 2019-12-11 DIAGNOSIS — S92492S Other fracture of left great toe, sequela: Secondary | ICD-10-CM | POA: Diagnosis not present

## 2019-12-11 DIAGNOSIS — S82891D Other fracture of right lower leg, subsequent encounter for closed fracture with routine healing: Secondary | ICD-10-CM | POA: Diagnosis not present

## 2019-12-11 DIAGNOSIS — S92492D Other fracture of left great toe, subsequent encounter for fracture with routine healing: Secondary | ICD-10-CM | POA: Diagnosis not present

## 2019-12-11 DIAGNOSIS — M6281 Muscle weakness (generalized): Secondary | ICD-10-CM | POA: Diagnosis not present

## 2019-12-11 DIAGNOSIS — M24572 Contracture, left ankle: Secondary | ICD-10-CM | POA: Diagnosis not present

## 2019-12-12 DIAGNOSIS — S92492S Other fracture of left great toe, sequela: Secondary | ICD-10-CM | POA: Diagnosis not present

## 2019-12-12 DIAGNOSIS — M6281 Muscle weakness (generalized): Secondary | ICD-10-CM | POA: Diagnosis not present

## 2019-12-12 DIAGNOSIS — M24572 Contracture, left ankle: Secondary | ICD-10-CM | POA: Diagnosis not present

## 2019-12-12 DIAGNOSIS — S92492D Other fracture of left great toe, subsequent encounter for fracture with routine healing: Secondary | ICD-10-CM | POA: Diagnosis not present

## 2019-12-12 DIAGNOSIS — S82891S Other fracture of right lower leg, sequela: Secondary | ICD-10-CM | POA: Diagnosis not present

## 2019-12-12 DIAGNOSIS — S82891D Other fracture of right lower leg, subsequent encounter for closed fracture with routine healing: Secondary | ICD-10-CM | POA: Diagnosis not present

## 2019-12-13 DIAGNOSIS — M24571 Contracture, right ankle: Secondary | ICD-10-CM | POA: Diagnosis not present

## 2019-12-13 DIAGNOSIS — S92492D Other fracture of left great toe, subsequent encounter for fracture with routine healing: Secondary | ICD-10-CM | POA: Diagnosis not present

## 2019-12-13 DIAGNOSIS — S82891S Other fracture of right lower leg, sequela: Secondary | ICD-10-CM | POA: Diagnosis not present

## 2019-12-13 DIAGNOSIS — S92492S Other fracture of left great toe, sequela: Secondary | ICD-10-CM | POA: Diagnosis not present

## 2019-12-13 DIAGNOSIS — M24572 Contracture, left ankle: Secondary | ICD-10-CM | POA: Diagnosis not present

## 2019-12-13 DIAGNOSIS — M6281 Muscle weakness (generalized): Secondary | ICD-10-CM | POA: Diagnosis not present

## 2019-12-13 DIAGNOSIS — F0280 Dementia in other diseases classified elsewhere without behavioral disturbance: Secondary | ICD-10-CM | POA: Diagnosis not present

## 2019-12-13 DIAGNOSIS — S82891D Other fracture of right lower leg, subsequent encounter for closed fracture with routine healing: Secondary | ICD-10-CM | POA: Diagnosis not present

## 2019-12-16 DIAGNOSIS — M24572 Contracture, left ankle: Secondary | ICD-10-CM | POA: Diagnosis not present

## 2019-12-16 DIAGNOSIS — S82891D Other fracture of right lower leg, subsequent encounter for closed fracture with routine healing: Secondary | ICD-10-CM | POA: Diagnosis not present

## 2019-12-16 DIAGNOSIS — M6281 Muscle weakness (generalized): Secondary | ICD-10-CM | POA: Diagnosis not present

## 2019-12-16 DIAGNOSIS — S92492S Other fracture of left great toe, sequela: Secondary | ICD-10-CM | POA: Diagnosis not present

## 2019-12-16 DIAGNOSIS — S92492D Other fracture of left great toe, subsequent encounter for fracture with routine healing: Secondary | ICD-10-CM | POA: Diagnosis not present

## 2019-12-16 DIAGNOSIS — S82891S Other fracture of right lower leg, sequela: Secondary | ICD-10-CM | POA: Diagnosis not present

## 2019-12-17 DIAGNOSIS — S92492S Other fracture of left great toe, sequela: Secondary | ICD-10-CM | POA: Diagnosis not present

## 2019-12-17 DIAGNOSIS — M24572 Contracture, left ankle: Secondary | ICD-10-CM | POA: Diagnosis not present

## 2019-12-17 DIAGNOSIS — M6281 Muscle weakness (generalized): Secondary | ICD-10-CM | POA: Diagnosis not present

## 2019-12-17 DIAGNOSIS — S92492D Other fracture of left great toe, subsequent encounter for fracture with routine healing: Secondary | ICD-10-CM | POA: Diagnosis not present

## 2019-12-17 DIAGNOSIS — S82891S Other fracture of right lower leg, sequela: Secondary | ICD-10-CM | POA: Diagnosis not present

## 2019-12-17 DIAGNOSIS — S82891D Other fracture of right lower leg, subsequent encounter for closed fracture with routine healing: Secondary | ICD-10-CM | POA: Diagnosis not present

## 2019-12-18 DIAGNOSIS — M24572 Contracture, left ankle: Secondary | ICD-10-CM | POA: Diagnosis not present

## 2019-12-18 DIAGNOSIS — S92492D Other fracture of left great toe, subsequent encounter for fracture with routine healing: Secondary | ICD-10-CM | POA: Diagnosis not present

## 2019-12-18 DIAGNOSIS — S82891D Other fracture of right lower leg, subsequent encounter for closed fracture with routine healing: Secondary | ICD-10-CM | POA: Diagnosis not present

## 2019-12-18 DIAGNOSIS — S82891S Other fracture of right lower leg, sequela: Secondary | ICD-10-CM | POA: Diagnosis not present

## 2019-12-18 DIAGNOSIS — M6281 Muscle weakness (generalized): Secondary | ICD-10-CM | POA: Diagnosis not present

## 2019-12-18 DIAGNOSIS — S92492S Other fracture of left great toe, sequela: Secondary | ICD-10-CM | POA: Diagnosis not present

## 2019-12-19 DIAGNOSIS — S82891S Other fracture of right lower leg, sequela: Secondary | ICD-10-CM | POA: Diagnosis not present

## 2019-12-19 DIAGNOSIS — S82891D Other fracture of right lower leg, subsequent encounter for closed fracture with routine healing: Secondary | ICD-10-CM | POA: Diagnosis not present

## 2019-12-19 DIAGNOSIS — M6281 Muscle weakness (generalized): Secondary | ICD-10-CM | POA: Diagnosis not present

## 2019-12-19 DIAGNOSIS — M24572 Contracture, left ankle: Secondary | ICD-10-CM | POA: Diagnosis not present

## 2019-12-19 DIAGNOSIS — S92492D Other fracture of left great toe, subsequent encounter for fracture with routine healing: Secondary | ICD-10-CM | POA: Diagnosis not present

## 2019-12-19 DIAGNOSIS — S92492S Other fracture of left great toe, sequela: Secondary | ICD-10-CM | POA: Diagnosis not present

## 2019-12-20 DIAGNOSIS — S82891S Other fracture of right lower leg, sequela: Secondary | ICD-10-CM | POA: Diagnosis not present

## 2019-12-20 DIAGNOSIS — M24572 Contracture, left ankle: Secondary | ICD-10-CM | POA: Diagnosis not present

## 2019-12-20 DIAGNOSIS — S92492D Other fracture of left great toe, subsequent encounter for fracture with routine healing: Secondary | ICD-10-CM | POA: Diagnosis not present

## 2019-12-20 DIAGNOSIS — M6281 Muscle weakness (generalized): Secondary | ICD-10-CM | POA: Diagnosis not present

## 2019-12-20 DIAGNOSIS — S92492S Other fracture of left great toe, sequela: Secondary | ICD-10-CM | POA: Diagnosis not present

## 2019-12-20 DIAGNOSIS — S82891D Other fracture of right lower leg, subsequent encounter for closed fracture with routine healing: Secondary | ICD-10-CM | POA: Diagnosis not present

## 2019-12-23 DIAGNOSIS — M6281 Muscle weakness (generalized): Secondary | ICD-10-CM | POA: Diagnosis not present

## 2019-12-23 DIAGNOSIS — S82891S Other fracture of right lower leg, sequela: Secondary | ICD-10-CM | POA: Diagnosis not present

## 2019-12-23 DIAGNOSIS — M24572 Contracture, left ankle: Secondary | ICD-10-CM | POA: Diagnosis not present

## 2019-12-23 DIAGNOSIS — S82891D Other fracture of right lower leg, subsequent encounter for closed fracture with routine healing: Secondary | ICD-10-CM | POA: Diagnosis not present

## 2019-12-23 DIAGNOSIS — S92492D Other fracture of left great toe, subsequent encounter for fracture with routine healing: Secondary | ICD-10-CM | POA: Diagnosis not present

## 2019-12-23 DIAGNOSIS — S92492S Other fracture of left great toe, sequela: Secondary | ICD-10-CM | POA: Diagnosis not present

## 2019-12-24 DIAGNOSIS — S82891D Other fracture of right lower leg, subsequent encounter for closed fracture with routine healing: Secondary | ICD-10-CM | POA: Diagnosis not present

## 2019-12-24 DIAGNOSIS — S92492D Other fracture of left great toe, subsequent encounter for fracture with routine healing: Secondary | ICD-10-CM | POA: Diagnosis not present

## 2019-12-24 DIAGNOSIS — M6281 Muscle weakness (generalized): Secondary | ICD-10-CM | POA: Diagnosis not present

## 2019-12-24 DIAGNOSIS — M24572 Contracture, left ankle: Secondary | ICD-10-CM | POA: Diagnosis not present

## 2019-12-24 DIAGNOSIS — S82891S Other fracture of right lower leg, sequela: Secondary | ICD-10-CM | POA: Diagnosis not present

## 2019-12-24 DIAGNOSIS — S92492S Other fracture of left great toe, sequela: Secondary | ICD-10-CM | POA: Diagnosis not present

## 2019-12-25 DIAGNOSIS — S92492S Other fracture of left great toe, sequela: Secondary | ICD-10-CM | POA: Diagnosis not present

## 2019-12-25 DIAGNOSIS — M24572 Contracture, left ankle: Secondary | ICD-10-CM | POA: Diagnosis not present

## 2019-12-25 DIAGNOSIS — S92492D Other fracture of left great toe, subsequent encounter for fracture with routine healing: Secondary | ICD-10-CM | POA: Diagnosis not present

## 2019-12-25 DIAGNOSIS — S82891S Other fracture of right lower leg, sequela: Secondary | ICD-10-CM | POA: Diagnosis not present

## 2019-12-25 DIAGNOSIS — S82891D Other fracture of right lower leg, subsequent encounter for closed fracture with routine healing: Secondary | ICD-10-CM | POA: Diagnosis not present

## 2019-12-25 DIAGNOSIS — M6281 Muscle weakness (generalized): Secondary | ICD-10-CM | POA: Diagnosis not present

## 2019-12-26 DIAGNOSIS — S82891S Other fracture of right lower leg, sequela: Secondary | ICD-10-CM | POA: Diagnosis not present

## 2019-12-26 DIAGNOSIS — M24572 Contracture, left ankle: Secondary | ICD-10-CM | POA: Diagnosis not present

## 2019-12-26 DIAGNOSIS — M6281 Muscle weakness (generalized): Secondary | ICD-10-CM | POA: Diagnosis not present

## 2019-12-26 DIAGNOSIS — S82891D Other fracture of right lower leg, subsequent encounter for closed fracture with routine healing: Secondary | ICD-10-CM | POA: Diagnosis not present

## 2019-12-26 DIAGNOSIS — S92492S Other fracture of left great toe, sequela: Secondary | ICD-10-CM | POA: Diagnosis not present

## 2019-12-26 DIAGNOSIS — S92492D Other fracture of left great toe, subsequent encounter for fracture with routine healing: Secondary | ICD-10-CM | POA: Diagnosis not present

## 2019-12-27 DIAGNOSIS — S82891S Other fracture of right lower leg, sequela: Secondary | ICD-10-CM | POA: Diagnosis not present

## 2019-12-27 DIAGNOSIS — M24572 Contracture, left ankle: Secondary | ICD-10-CM | POA: Diagnosis not present

## 2019-12-27 DIAGNOSIS — S82891D Other fracture of right lower leg, subsequent encounter for closed fracture with routine healing: Secondary | ICD-10-CM | POA: Diagnosis not present

## 2019-12-27 DIAGNOSIS — S92492D Other fracture of left great toe, subsequent encounter for fracture with routine healing: Secondary | ICD-10-CM | POA: Diagnosis not present

## 2019-12-27 DIAGNOSIS — S92492S Other fracture of left great toe, sequela: Secondary | ICD-10-CM | POA: Diagnosis not present

## 2019-12-27 DIAGNOSIS — M6281 Muscle weakness (generalized): Secondary | ICD-10-CM | POA: Diagnosis not present

## 2019-12-30 DIAGNOSIS — R1312 Dysphagia, oropharyngeal phase: Secondary | ICD-10-CM | POA: Diagnosis not present

## 2019-12-30 DIAGNOSIS — F0390 Unspecified dementia without behavioral disturbance: Secondary | ICD-10-CM | POA: Diagnosis not present

## 2019-12-30 DIAGNOSIS — S92492D Other fracture of left great toe, subsequent encounter for fracture with routine healing: Secondary | ICD-10-CM | POA: Diagnosis not present

## 2019-12-30 DIAGNOSIS — R41841 Cognitive communication deficit: Secondary | ICD-10-CM | POA: Diagnosis not present

## 2019-12-30 DIAGNOSIS — R2681 Unsteadiness on feet: Secondary | ICD-10-CM | POA: Diagnosis not present

## 2019-12-30 DIAGNOSIS — S82891D Other fracture of right lower leg, subsequent encounter for closed fracture with routine healing: Secondary | ICD-10-CM | POA: Diagnosis not present

## 2019-12-30 DIAGNOSIS — M6281 Muscle weakness (generalized): Secondary | ICD-10-CM | POA: Diagnosis not present

## 2019-12-30 DIAGNOSIS — S82891S Other fracture of right lower leg, sequela: Secondary | ICD-10-CM | POA: Diagnosis not present

## 2019-12-30 DIAGNOSIS — M24571 Contracture, right ankle: Secondary | ICD-10-CM | POA: Diagnosis not present

## 2019-12-30 DIAGNOSIS — S92492S Other fracture of left great toe, sequela: Secondary | ICD-10-CM | POA: Diagnosis not present

## 2019-12-30 DIAGNOSIS — R2689 Other abnormalities of gait and mobility: Secondary | ICD-10-CM | POA: Diagnosis not present

## 2019-12-30 DIAGNOSIS — M24572 Contracture, left ankle: Secondary | ICD-10-CM | POA: Diagnosis not present

## 2019-12-30 DIAGNOSIS — R278 Other lack of coordination: Secondary | ICD-10-CM | POA: Diagnosis not present

## 2019-12-30 DIAGNOSIS — R293 Abnormal posture: Secondary | ICD-10-CM | POA: Diagnosis not present

## 2019-12-31 DIAGNOSIS — G20 Parkinson's disease: Secondary | ICD-10-CM | POA: Diagnosis not present

## 2019-12-31 DIAGNOSIS — S92492D Other fracture of left great toe, subsequent encounter for fracture with routine healing: Secondary | ICD-10-CM | POA: Diagnosis not present

## 2019-12-31 DIAGNOSIS — F0390 Unspecified dementia without behavioral disturbance: Secondary | ICD-10-CM | POA: Diagnosis not present

## 2019-12-31 DIAGNOSIS — S92492S Other fracture of left great toe, sequela: Secondary | ICD-10-CM | POA: Diagnosis not present

## 2019-12-31 DIAGNOSIS — I2510 Atherosclerotic heart disease of native coronary artery without angina pectoris: Secondary | ICD-10-CM | POA: Diagnosis not present

## 2019-12-31 DIAGNOSIS — M6281 Muscle weakness (generalized): Secondary | ICD-10-CM | POA: Diagnosis not present

## 2019-12-31 DIAGNOSIS — G8929 Other chronic pain: Secondary | ICD-10-CM | POA: Diagnosis not present

## 2019-12-31 DIAGNOSIS — M24572 Contracture, left ankle: Secondary | ICD-10-CM | POA: Diagnosis not present

## 2019-12-31 DIAGNOSIS — S82891D Other fracture of right lower leg, subsequent encounter for closed fracture with routine healing: Secondary | ICD-10-CM | POA: Diagnosis not present

## 2019-12-31 DIAGNOSIS — S82891S Other fracture of right lower leg, sequela: Secondary | ICD-10-CM | POA: Diagnosis not present

## 2020-01-01 DIAGNOSIS — S82891D Other fracture of right lower leg, subsequent encounter for closed fracture with routine healing: Secondary | ICD-10-CM | POA: Diagnosis not present

## 2020-01-01 DIAGNOSIS — S82891S Other fracture of right lower leg, sequela: Secondary | ICD-10-CM | POA: Diagnosis not present

## 2020-01-01 DIAGNOSIS — S92492D Other fracture of left great toe, subsequent encounter for fracture with routine healing: Secondary | ICD-10-CM | POA: Diagnosis not present

## 2020-01-01 DIAGNOSIS — M6281 Muscle weakness (generalized): Secondary | ICD-10-CM | POA: Diagnosis not present

## 2020-01-01 DIAGNOSIS — S92492S Other fracture of left great toe, sequela: Secondary | ICD-10-CM | POA: Diagnosis not present

## 2020-01-01 DIAGNOSIS — M24572 Contracture, left ankle: Secondary | ICD-10-CM | POA: Diagnosis not present

## 2020-01-02 DIAGNOSIS — M24572 Contracture, left ankle: Secondary | ICD-10-CM | POA: Diagnosis not present

## 2020-01-02 DIAGNOSIS — S82891D Other fracture of right lower leg, subsequent encounter for closed fracture with routine healing: Secondary | ICD-10-CM | POA: Diagnosis not present

## 2020-01-02 DIAGNOSIS — S92492D Other fracture of left great toe, subsequent encounter for fracture with routine healing: Secondary | ICD-10-CM | POA: Diagnosis not present

## 2020-01-02 DIAGNOSIS — M6281 Muscle weakness (generalized): Secondary | ICD-10-CM | POA: Diagnosis not present

## 2020-01-02 DIAGNOSIS — S92492S Other fracture of left great toe, sequela: Secondary | ICD-10-CM | POA: Diagnosis not present

## 2020-01-02 DIAGNOSIS — S82891S Other fracture of right lower leg, sequela: Secondary | ICD-10-CM | POA: Diagnosis not present

## 2020-01-03 DIAGNOSIS — Z20822 Contact with and (suspected) exposure to covid-19: Secondary | ICD-10-CM | POA: Diagnosis not present

## 2020-01-15 DIAGNOSIS — Z20822 Contact with and (suspected) exposure to covid-19: Secondary | ICD-10-CM | POA: Diagnosis not present

## 2020-01-20 DIAGNOSIS — F331 Major depressive disorder, recurrent, moderate: Secondary | ICD-10-CM | POA: Diagnosis not present

## 2020-01-20 DIAGNOSIS — F411 Generalized anxiety disorder: Secondary | ICD-10-CM | POA: Diagnosis not present

## 2020-01-20 DIAGNOSIS — Z20822 Contact with and (suspected) exposure to covid-19: Secondary | ICD-10-CM | POA: Diagnosis not present

## 2020-01-20 DIAGNOSIS — F0391 Unspecified dementia with behavioral disturbance: Secondary | ICD-10-CM | POA: Diagnosis not present

## 2020-01-28 DIAGNOSIS — Z20822 Contact with and (suspected) exposure to covid-19: Secondary | ICD-10-CM | POA: Diagnosis not present

## 2020-01-29 ENCOUNTER — Ambulatory Visit (INDEPENDENT_AMBULATORY_CARE_PROVIDER_SITE_OTHER): Payer: Medicare Other | Admitting: Neurology

## 2020-01-29 ENCOUNTER — Other Ambulatory Visit: Payer: Self-pay

## 2020-01-29 ENCOUNTER — Encounter: Payer: Self-pay | Admitting: Neurology

## 2020-01-29 VITALS — BP 120/65 | HR 64

## 2020-01-29 DIAGNOSIS — G301 Alzheimer's disease with late onset: Secondary | ICD-10-CM

## 2020-01-29 DIAGNOSIS — F0280 Dementia in other diseases classified elsewhere without behavioral disturbance: Secondary | ICD-10-CM | POA: Diagnosis not present

## 2020-01-29 NOTE — Progress Notes (Signed)
Reason for visit: Alzheimer's disease, possible seizure  Jasmine Carrillo is an 79 y.o. 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  The patient currently is residing at [facility].  She is total care.  She fractured her right leg following a fall on 30 January 2019, she has not really ambulated since that time.  The patient requires assistance with bathing, dressing, and feeding.  She is minimally verbal.  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  Due to some problems with back pain, she is on gabapentin.  Sometime in June 2021, she was noted to have a seizure-like episode.  She was placed back on a very low-dose of Depakote taking 125 mg twice daily.  She comes in today with her daughter.  The daughter has very little information about what exactly happened.  To her knowledge, only one event was noted.  The patient remains on Aricept and Namenda currently.  She comes to this office for further evaluation.  Past Medical History:  Diagnosis Date  . Abnormal posture   . Anxiety   . Atherosclerotic heart disease of native coronary artery without angina pectoris   . Cognitive communication deficit   . Depression   . Falls frequently   . Fracture    right lower leg   . Hyperlipidemia   . Hypertension   . Repeated falls   . Unspecified dementia without behavioral disturbance [facility])     Past Surgical History:  Procedure Laterality Date  . CORONARY ARTERY BYPASS GRAFT    . NECK SURGERY     Neck bone replacement   . VALVE REPLACEMENT      History reviewed. No pertinent family history.  Social history:  reports that she quit smoking about 13 years ago. She has never used smokeless tobacco. She reports previous alcohol use. She reports that she does not use drugs.   No Known Allergies  Medications:  Prior to Admission medications   Medication Sig  Start Date End Date Taking? Authorizing Provider  Artificial Tear Ointment (DRY EYES OP) Apply 1 drop to eye daily as needed (dry eyes).    [provider]  ASPIRIN 81 PO Take 81 mg by mouth daily.     [provider]  CALCIUM PO Take 1 tablet by mouth daily.    [provider]  carvedilol (COREG) 6.25 MG tablet Take 6.25 mg by mouth 2 (two) times daily. 12/26/18   [provider]  divalproex (DEPAKOTE) 250 MG DR tablet Take 1 tablet (250 mg total) by mouth at bedtime. 06/16/18   York Spaniel, MD  donepezil (ARICEPT) 10 MG tablet Take 10 mg by mouth at bedtime.  03/23/11   [provider]  Ensure (ENSURE) Take 237 mLs by mouth daily.    [provider]  escitalopram (LEXAPRO) 20 MG tablet Take 20 mg by mouth daily. 08/02/18   [provider]  HYDROcodone-acetaminophen (NORCO) 7.5-325 MG tablet Take 1 tablet by mouth every 6 (six) hours as needed for up to 10 doses (for pain). 02/08/19   Hughie Closs, MD  memantine (NAMENDA) 10 MG tablet Take 10 mg by mouth 2 (two) times daily.  02/24/15   [provider]  Multiple Vitamins-Minerals (MULTIVITAMIN PO) Take 1 tablet by mouth daily.    [provider]  simvastatin (ZOCOR) 40 MG tablet Take 40 mg by mouth every evening. 06/30/18  [provider]  tretinoin (RETIN-A) 0.025 % cream Apply 1 application topically every evening. 07/31/18   [provider]  Tretinoin, Facial Wrinkles, (TRETINOIN, EMOLLIENT,) 0.05 % CREA Take 1 application by mouth as needed. 06/20/11   [provider]    ROS:  Out of a complete 14 system review of symptoms, the patient complains only of the following symptoms, and all other reviewed systems are negative.  Possible seizure Dementia Myoclonus  Blood pressure 120/65, pulse 64.  Physical Exam  General: The patient is alert, minimally verbal.  Skin: No significant peripheral edema is noted.   Neurologic  Exam  Mental status: The patient is alert, will not state her name, does not state name of daughter.   Cranial nerves: Facial symmetry is present. Speech very limited.  Motor: The patient has the ability to lift the arms up, with good strength is seen.  She does not move her legs much to command.  Sensory examination: Soft touch sensation according to the patient is symmetric.  Coordination: The patient will not follow commands for finger-nose-finger and heel-to-shin bilaterally.  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  Gait and station: The patient is wheelchair-bound, she is not ambulatory.  Reflexes: Deep tendon reflexes are symmetric.   CT head 06/15/18:  IMPRESSION: Abnormal CT scan of the head showing age advanced generalized cerebral cortical atrophy and mild changes of chronic microvascular ischemia. 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  * CT scan images were reviewed online. I agree with the written report.    Assessment/Plan:  1.  Alzheimer's disease, end-stage  2.  Myoclonus  3.  Possible seizure  The patient currently is end-stage with her dementia.  Given the history of a possible seizure, we will stop the Aricept which may lower the seizure threshold.  She is on low-dose Depakote and gabapentin currently.  This will be continued.  She will follow-up here in 4 months.  We may be able to discontinue Namenda in the future as the patient is not having a lot of problems with agitation.  Marlan Palau MD 01/29/2020 10:08 AM  [facility] [address] [place], [place] [address]  Phone [phone] Fax [phone]

## 2020-01-29 NOTE — Patient Instructions (Signed)
Stop the aricept

## 2020-01-30 DIAGNOSIS — R627 Adult failure to thrive: Secondary | ICD-10-CM | POA: Diagnosis not present

## 2020-01-30 DIAGNOSIS — F0390 Unspecified dementia without behavioral disturbance: Secondary | ICD-10-CM | POA: Diagnosis not present

## 2020-01-30 DIAGNOSIS — R259 Unspecified abnormal involuntary movements: Secondary | ICD-10-CM | POA: Diagnosis not present

## 2020-01-30 DIAGNOSIS — Z20822 Contact with and (suspected) exposure to covid-19: Secondary | ICD-10-CM | POA: Diagnosis not present

## 2020-02-06 DIAGNOSIS — H26493 Other secondary cataract, bilateral: Secondary | ICD-10-CM | POA: Diagnosis not present

## 2020-02-06 DIAGNOSIS — Z961 Presence of intraocular lens: Secondary | ICD-10-CM | POA: Diagnosis not present

## 2020-02-11 DIAGNOSIS — Z20822 Contact with and (suspected) exposure to covid-19: Secondary | ICD-10-CM | POA: Diagnosis not present

## 2020-02-17 DIAGNOSIS — U071 COVID-19: Secondary | ICD-10-CM | POA: Diagnosis not present

## 2020-02-20 DIAGNOSIS — U071 COVID-19: Secondary | ICD-10-CM | POA: Diagnosis not present

## 2020-02-24 DIAGNOSIS — F411 Generalized anxiety disorder: Secondary | ICD-10-CM | POA: Diagnosis not present

## 2020-02-24 DIAGNOSIS — Z20822 Contact with and (suspected) exposure to covid-19: Secondary | ICD-10-CM | POA: Diagnosis not present

## 2020-02-24 DIAGNOSIS — F0391 Unspecified dementia with behavioral disturbance: Secondary | ICD-10-CM | POA: Diagnosis not present

## 2020-02-24 DIAGNOSIS — F331 Major depressive disorder, recurrent, moderate: Secondary | ICD-10-CM | POA: Diagnosis not present

## 2020-03-03 DIAGNOSIS — U071 COVID-19: Secondary | ICD-10-CM | POA: Diagnosis not present

## 2020-03-05 DIAGNOSIS — R41841 Cognitive communication deficit: Secondary | ICD-10-CM | POA: Diagnosis not present

## 2020-03-05 DIAGNOSIS — F0390 Unspecified dementia without behavioral disturbance: Secondary | ICD-10-CM | POA: Diagnosis not present

## 2020-03-05 DIAGNOSIS — Z9181 History of falling: Secondary | ICD-10-CM | POA: Diagnosis not present

## 2020-03-05 DIAGNOSIS — S92492S Other fracture of left great toe, sequela: Secondary | ICD-10-CM | POA: Diagnosis not present

## 2020-03-05 DIAGNOSIS — R2689 Other abnormalities of gait and mobility: Secondary | ICD-10-CM | POA: Diagnosis not present

## 2020-03-05 DIAGNOSIS — M24572 Contracture, left ankle: Secondary | ICD-10-CM | POA: Diagnosis not present

## 2020-03-05 DIAGNOSIS — S82891S Other fracture of right lower leg, sequela: Secondary | ICD-10-CM | POA: Diagnosis not present

## 2020-03-05 DIAGNOSIS — R2681 Unsteadiness on feet: Secondary | ICD-10-CM | POA: Diagnosis not present

## 2020-03-05 DIAGNOSIS — R278 Other lack of coordination: Secondary | ICD-10-CM | POA: Diagnosis not present

## 2020-03-05 DIAGNOSIS — R293 Abnormal posture: Secondary | ICD-10-CM | POA: Diagnosis not present

## 2020-03-05 DIAGNOSIS — R1312 Dysphagia, oropharyngeal phase: Secondary | ICD-10-CM | POA: Diagnosis not present

## 2020-03-05 DIAGNOSIS — M6281 Muscle weakness (generalized): Secondary | ICD-10-CM | POA: Diagnosis not present

## 2020-03-05 DIAGNOSIS — S82891D Other fracture of right lower leg, subsequent encounter for closed fracture with routine healing: Secondary | ICD-10-CM | POA: Diagnosis not present

## 2020-03-05 DIAGNOSIS — S92492D Other fracture of left great toe, subsequent encounter for fracture with routine healing: Secondary | ICD-10-CM | POA: Diagnosis not present

## 2020-03-05 DIAGNOSIS — M24571 Contracture, right ankle: Secondary | ICD-10-CM | POA: Diagnosis not present

## 2020-03-06 DIAGNOSIS — S92492D Other fracture of left great toe, subsequent encounter for fracture with routine healing: Secondary | ICD-10-CM | POA: Diagnosis not present

## 2020-03-06 DIAGNOSIS — S82891S Other fracture of right lower leg, sequela: Secondary | ICD-10-CM | POA: Diagnosis not present

## 2020-03-06 DIAGNOSIS — Z9181 History of falling: Secondary | ICD-10-CM | POA: Diagnosis not present

## 2020-03-06 DIAGNOSIS — S92492S Other fracture of left great toe, sequela: Secondary | ICD-10-CM | POA: Diagnosis not present

## 2020-03-06 DIAGNOSIS — S82891D Other fracture of right lower leg, subsequent encounter for closed fracture with routine healing: Secondary | ICD-10-CM | POA: Diagnosis not present

## 2020-03-06 DIAGNOSIS — M24572 Contracture, left ankle: Secondary | ICD-10-CM | POA: Diagnosis not present

## 2020-03-09 DIAGNOSIS — Z9181 History of falling: Secondary | ICD-10-CM | POA: Diagnosis not present

## 2020-03-09 DIAGNOSIS — S82891D Other fracture of right lower leg, subsequent encounter for closed fracture with routine healing: Secondary | ICD-10-CM | POA: Diagnosis not present

## 2020-03-09 DIAGNOSIS — S92492D Other fracture of left great toe, subsequent encounter for fracture with routine healing: Secondary | ICD-10-CM | POA: Diagnosis not present

## 2020-03-09 DIAGNOSIS — S92492S Other fracture of left great toe, sequela: Secondary | ICD-10-CM | POA: Diagnosis not present

## 2020-03-09 DIAGNOSIS — S82891S Other fracture of right lower leg, sequela: Secondary | ICD-10-CM | POA: Diagnosis not present

## 2020-03-09 DIAGNOSIS — M24572 Contracture, left ankle: Secondary | ICD-10-CM | POA: Diagnosis not present

## 2020-03-10 DIAGNOSIS — Z9181 History of falling: Secondary | ICD-10-CM | POA: Diagnosis not present

## 2020-03-10 DIAGNOSIS — S92492D Other fracture of left great toe, subsequent encounter for fracture with routine healing: Secondary | ICD-10-CM | POA: Diagnosis not present

## 2020-03-10 DIAGNOSIS — S82891D Other fracture of right lower leg, subsequent encounter for closed fracture with routine healing: Secondary | ICD-10-CM | POA: Diagnosis not present

## 2020-03-10 DIAGNOSIS — S92492S Other fracture of left great toe, sequela: Secondary | ICD-10-CM | POA: Diagnosis not present

## 2020-03-10 DIAGNOSIS — S82891S Other fracture of right lower leg, sequela: Secondary | ICD-10-CM | POA: Diagnosis not present

## 2020-03-10 DIAGNOSIS — M24572 Contracture, left ankle: Secondary | ICD-10-CM | POA: Diagnosis not present

## 2020-03-11 DIAGNOSIS — S92492S Other fracture of left great toe, sequela: Secondary | ICD-10-CM | POA: Diagnosis not present

## 2020-03-11 DIAGNOSIS — S92492D Other fracture of left great toe, subsequent encounter for fracture with routine healing: Secondary | ICD-10-CM | POA: Diagnosis not present

## 2020-03-11 DIAGNOSIS — S82891D Other fracture of right lower leg, subsequent encounter for closed fracture with routine healing: Secondary | ICD-10-CM | POA: Diagnosis not present

## 2020-03-11 DIAGNOSIS — M24572 Contracture, left ankle: Secondary | ICD-10-CM | POA: Diagnosis not present

## 2020-03-11 DIAGNOSIS — S82891S Other fracture of right lower leg, sequela: Secondary | ICD-10-CM | POA: Diagnosis not present

## 2020-03-11 DIAGNOSIS — Z9181 History of falling: Secondary | ICD-10-CM | POA: Diagnosis not present

## 2020-03-12 DIAGNOSIS — S82891D Other fracture of right lower leg, subsequent encounter for closed fracture with routine healing: Secondary | ICD-10-CM | POA: Diagnosis not present

## 2020-03-12 DIAGNOSIS — M24572 Contracture, left ankle: Secondary | ICD-10-CM | POA: Diagnosis not present

## 2020-03-12 DIAGNOSIS — Z9181 History of falling: Secondary | ICD-10-CM | POA: Diagnosis not present

## 2020-03-12 DIAGNOSIS — S82891S Other fracture of right lower leg, sequela: Secondary | ICD-10-CM | POA: Diagnosis not present

## 2020-03-12 DIAGNOSIS — S92492D Other fracture of left great toe, subsequent encounter for fracture with routine healing: Secondary | ICD-10-CM | POA: Diagnosis not present

## 2020-03-12 DIAGNOSIS — S92492S Other fracture of left great toe, sequela: Secondary | ICD-10-CM | POA: Diagnosis not present

## 2020-03-13 DIAGNOSIS — S92492S Other fracture of left great toe, sequela: Secondary | ICD-10-CM | POA: Diagnosis not present

## 2020-03-13 DIAGNOSIS — Z9181 History of falling: Secondary | ICD-10-CM | POA: Diagnosis not present

## 2020-03-13 DIAGNOSIS — S82891D Other fracture of right lower leg, subsequent encounter for closed fracture with routine healing: Secondary | ICD-10-CM | POA: Diagnosis not present

## 2020-03-13 DIAGNOSIS — S82891S Other fracture of right lower leg, sequela: Secondary | ICD-10-CM | POA: Diagnosis not present

## 2020-03-13 DIAGNOSIS — S92492D Other fracture of left great toe, subsequent encounter for fracture with routine healing: Secondary | ICD-10-CM | POA: Diagnosis not present

## 2020-03-13 DIAGNOSIS — M24572 Contracture, left ankle: Secondary | ICD-10-CM | POA: Diagnosis not present

## 2020-03-16 DIAGNOSIS — U071 COVID-19: Secondary | ICD-10-CM | POA: Diagnosis not present

## 2020-03-17 DIAGNOSIS — S82891S Other fracture of right lower leg, sequela: Secondary | ICD-10-CM | POA: Diagnosis not present

## 2020-03-17 DIAGNOSIS — S92492S Other fracture of left great toe, sequela: Secondary | ICD-10-CM | POA: Diagnosis not present

## 2020-03-17 DIAGNOSIS — F3489 Other specified persistent mood disorders: Secondary | ICD-10-CM | POA: Diagnosis not present

## 2020-03-17 DIAGNOSIS — Z9181 History of falling: Secondary | ICD-10-CM | POA: Diagnosis not present

## 2020-03-17 DIAGNOSIS — R2689 Other abnormalities of gait and mobility: Secondary | ICD-10-CM | POA: Diagnosis not present

## 2020-03-17 DIAGNOSIS — S92492D Other fracture of left great toe, subsequent encounter for fracture with routine healing: Secondary | ICD-10-CM | POA: Diagnosis not present

## 2020-03-17 DIAGNOSIS — E559 Vitamin D deficiency, unspecified: Secondary | ICD-10-CM | POA: Diagnosis not present

## 2020-03-17 DIAGNOSIS — F0280 Dementia in other diseases classified elsewhere without behavioral disturbance: Secondary | ICD-10-CM | POA: Diagnosis not present

## 2020-03-17 DIAGNOSIS — I2510 Atherosclerotic heart disease of native coronary artery without angina pectoris: Secondary | ICD-10-CM | POA: Diagnosis not present

## 2020-03-17 DIAGNOSIS — J984 Other disorders of lung: Secondary | ICD-10-CM | POA: Diagnosis not present

## 2020-03-17 DIAGNOSIS — D519 Vitamin B12 deficiency anemia, unspecified: Secondary | ICD-10-CM | POA: Diagnosis not present

## 2020-03-17 DIAGNOSIS — G8929 Other chronic pain: Secondary | ICD-10-CM | POA: Diagnosis not present

## 2020-03-17 DIAGNOSIS — D649 Anemia, unspecified: Secondary | ICD-10-CM | POA: Diagnosis not present

## 2020-03-17 DIAGNOSIS — M24572 Contracture, left ankle: Secondary | ICD-10-CM | POA: Diagnosis not present

## 2020-03-17 DIAGNOSIS — G20 Parkinson's disease: Secondary | ICD-10-CM | POA: Diagnosis not present

## 2020-03-17 DIAGNOSIS — I10 Essential (primary) hypertension: Secondary | ICD-10-CM | POA: Diagnosis not present

## 2020-03-17 DIAGNOSIS — S82891D Other fracture of right lower leg, subsequent encounter for closed fracture with routine healing: Secondary | ICD-10-CM | POA: Diagnosis not present

## 2020-03-18 DIAGNOSIS — M24572 Contracture, left ankle: Secondary | ICD-10-CM | POA: Diagnosis not present

## 2020-03-18 DIAGNOSIS — S92492S Other fracture of left great toe, sequela: Secondary | ICD-10-CM | POA: Diagnosis not present

## 2020-03-18 DIAGNOSIS — S92492D Other fracture of left great toe, subsequent encounter for fracture with routine healing: Secondary | ICD-10-CM | POA: Diagnosis not present

## 2020-03-18 DIAGNOSIS — S82891D Other fracture of right lower leg, subsequent encounter for closed fracture with routine healing: Secondary | ICD-10-CM | POA: Diagnosis not present

## 2020-03-18 DIAGNOSIS — S82891S Other fracture of right lower leg, sequela: Secondary | ICD-10-CM | POA: Diagnosis not present

## 2020-03-18 DIAGNOSIS — Z9181 History of falling: Secondary | ICD-10-CM | POA: Diagnosis not present

## 2020-03-19 DIAGNOSIS — S92492D Other fracture of left great toe, subsequent encounter for fracture with routine healing: Secondary | ICD-10-CM | POA: Diagnosis not present

## 2020-03-19 DIAGNOSIS — S82891S Other fracture of right lower leg, sequela: Secondary | ICD-10-CM | POA: Diagnosis not present

## 2020-03-19 DIAGNOSIS — S92492S Other fracture of left great toe, sequela: Secondary | ICD-10-CM | POA: Diagnosis not present

## 2020-03-19 DIAGNOSIS — Z9181 History of falling: Secondary | ICD-10-CM | POA: Diagnosis not present

## 2020-03-19 DIAGNOSIS — S82891D Other fracture of right lower leg, subsequent encounter for closed fracture with routine healing: Secondary | ICD-10-CM | POA: Diagnosis not present

## 2020-03-19 DIAGNOSIS — M24572 Contracture, left ankle: Secondary | ICD-10-CM | POA: Diagnosis not present

## 2020-03-26 ENCOUNTER — Other Ambulatory Visit: Payer: Self-pay

## 2020-03-26 ENCOUNTER — Emergency Department (HOSPITAL_COMMUNITY): Payer: Medicare Other

## 2020-03-26 ENCOUNTER — Encounter (HOSPITAL_COMMUNITY): Payer: Self-pay

## 2020-03-26 ENCOUNTER — Inpatient Hospital Stay (HOSPITAL_COMMUNITY)
Admission: EM | Admit: 2020-03-26 | Discharge: 2020-03-30 | DRG: 871 | Disposition: A | Discharge status: Skilled Nursing Facility | Payer: Medicare Other | Source: Skilled Nursing Facility | Attending: Internal Medicine | Admitting: Internal Medicine

## 2020-03-26 DIAGNOSIS — I10 Essential (primary) hypertension: Secondary | ICD-10-CM | POA: Diagnosis present

## 2020-03-26 DIAGNOSIS — I959 Hypotension, unspecified: Secondary | ICD-10-CM | POA: Diagnosis present

## 2020-03-26 DIAGNOSIS — I714 Abdominal aortic aneurysm, without rupture: Secondary | ICD-10-CM | POA: Diagnosis present

## 2020-03-26 DIAGNOSIS — K5900 Constipation, unspecified: Secondary | ICD-10-CM | POA: Diagnosis present

## 2020-03-26 DIAGNOSIS — N390 Urinary tract infection, site not specified: Secondary | ICD-10-CM | POA: Diagnosis present

## 2020-03-26 DIAGNOSIS — J189 Pneumonia, unspecified organism: Secondary | ICD-10-CM | POA: Diagnosis not present

## 2020-03-26 DIAGNOSIS — A4151 Sepsis due to Escherichia coli [E. coli]: Principal | ICD-10-CM | POA: Diagnosis present

## 2020-03-26 DIAGNOSIS — Z7401 Bed confinement status: Secondary | ICD-10-CM

## 2020-03-26 DIAGNOSIS — F0280 Dementia in other diseases classified elsewhere without behavioral disturbance: Secondary | ICD-10-CM | POA: Diagnosis present

## 2020-03-26 DIAGNOSIS — R7989 Other specified abnormal findings of blood chemistry: Secondary | ICD-10-CM | POA: Diagnosis present

## 2020-03-26 DIAGNOSIS — R930 Abnormal findings on diagnostic imaging of skull and head, not elsewhere classified: Secondary | ICD-10-CM | POA: Diagnosis not present

## 2020-03-26 DIAGNOSIS — R748 Abnormal levels of other serum enzymes: Secondary | ICD-10-CM | POA: Diagnosis present

## 2020-03-26 DIAGNOSIS — S2242XA Multiple fractures of ribs, left side, initial encounter for closed fracture: Secondary | ICD-10-CM | POA: Diagnosis not present

## 2020-03-26 DIAGNOSIS — G9341 Metabolic encephalopathy: Secondary | ICD-10-CM | POA: Diagnosis not present

## 2020-03-26 DIAGNOSIS — J9601 Acute respiratory failure with hypoxia: Secondary | ICD-10-CM | POA: Diagnosis present

## 2020-03-26 DIAGNOSIS — J9811 Atelectasis: Secondary | ICD-10-CM | POA: Diagnosis present

## 2020-03-26 DIAGNOSIS — R4182 Altered mental status, unspecified: Secondary | ICD-10-CM | POA: Diagnosis not present

## 2020-03-26 DIAGNOSIS — R6520 Severe sepsis without septic shock: Secondary | ICD-10-CM | POA: Diagnosis present

## 2020-03-26 DIAGNOSIS — K5909 Other constipation: Secondary | ICD-10-CM | POA: Diagnosis present

## 2020-03-26 DIAGNOSIS — Z8249 Family history of ischemic heart disease and other diseases of the circulatory system: Secondary | ICD-10-CM

## 2020-03-26 DIAGNOSIS — I712 Thoracic aortic aneurysm, without rupture: Secondary | ICD-10-CM | POA: Diagnosis present

## 2020-03-26 DIAGNOSIS — R0902 Hypoxemia: Secondary | ICD-10-CM

## 2020-03-26 DIAGNOSIS — E785 Hyperlipidemia, unspecified: Secondary | ICD-10-CM | POA: Diagnosis not present

## 2020-03-26 DIAGNOSIS — E860 Dehydration: Secondary | ICD-10-CM | POA: Diagnosis present

## 2020-03-26 DIAGNOSIS — G309 Alzheimer's disease, unspecified: Secondary | ICD-10-CM | POA: Diagnosis present

## 2020-03-26 DIAGNOSIS — F0390 Unspecified dementia without behavioral disturbance: Secondary | ICD-10-CM | POA: Diagnosis present

## 2020-03-26 DIAGNOSIS — J439 Emphysema, unspecified: Secondary | ICD-10-CM | POA: Diagnosis present

## 2020-03-26 DIAGNOSIS — Z20822 Contact with and (suspected) exposure to covid-19: Secondary | ICD-10-CM | POA: Diagnosis present

## 2020-03-26 DIAGNOSIS — I4510 Unspecified right bundle-branch block: Secondary | ICD-10-CM | POA: Diagnosis not present

## 2020-03-26 DIAGNOSIS — G20 Parkinson's disease: Secondary | ICD-10-CM | POA: Diagnosis present

## 2020-03-26 DIAGNOSIS — I739 Peripheral vascular disease, unspecified: Secondary | ICD-10-CM | POA: Diagnosis not present

## 2020-03-26 DIAGNOSIS — D649 Anemia, unspecified: Secondary | ICD-10-CM | POA: Diagnosis present

## 2020-03-26 DIAGNOSIS — K819 Cholecystitis, unspecified: Secondary | ICD-10-CM

## 2020-03-26 DIAGNOSIS — R55 Syncope and collapse: Secondary | ICD-10-CM | POA: Diagnosis not present

## 2020-03-26 DIAGNOSIS — I7121 Aneurysm of the ascending aorta, without rupture: Secondary | ICD-10-CM | POA: Diagnosis present

## 2020-03-26 DIAGNOSIS — K828 Other specified diseases of gallbladder: Secondary | ICD-10-CM | POA: Diagnosis not present

## 2020-03-26 DIAGNOSIS — G301 Alzheimer's disease with late onset: Secondary | ICD-10-CM | POA: Diagnosis not present

## 2020-03-26 DIAGNOSIS — R4189 Other symptoms and signs involving cognitive functions and awareness: Secondary | ICD-10-CM | POA: Diagnosis not present

## 2020-03-26 DIAGNOSIS — Z951 Presence of aortocoronary bypass graft: Secondary | ICD-10-CM

## 2020-03-26 DIAGNOSIS — R6510 Systemic inflammatory response syndrome (SIRS) of non-infectious origin without acute organ dysfunction: Secondary | ICD-10-CM | POA: Diagnosis present

## 2020-03-26 DIAGNOSIS — M4856XA Collapsed vertebra, not elsewhere classified, lumbar region, initial encounter for fracture: Secondary | ICD-10-CM | POA: Diagnosis present

## 2020-03-26 DIAGNOSIS — I639 Cerebral infarction, unspecified: Secondary | ICD-10-CM | POA: Diagnosis not present

## 2020-03-26 DIAGNOSIS — R296 Repeated falls: Secondary | ICD-10-CM

## 2020-03-26 DIAGNOSIS — Z87891 Personal history of nicotine dependence: Secondary | ICD-10-CM

## 2020-03-26 DIAGNOSIS — K449 Diaphragmatic hernia without obstruction or gangrene: Secondary | ICD-10-CM | POA: Diagnosis not present

## 2020-03-26 DIAGNOSIS — Z515 Encounter for palliative care: Secondary | ICD-10-CM

## 2020-03-26 DIAGNOSIS — Z66 Do not resuscitate: Secondary | ICD-10-CM | POA: Diagnosis not present

## 2020-03-26 DIAGNOSIS — Z953 Presence of xenogenic heart valve: Secondary | ICD-10-CM

## 2020-03-26 DIAGNOSIS — G253 Myoclonus: Secondary | ICD-10-CM | POA: Diagnosis present

## 2020-03-26 DIAGNOSIS — I2510 Atherosclerotic heart disease of native coronary artery without angina pectoris: Secondary | ICD-10-CM | POA: Diagnosis present

## 2020-03-26 DIAGNOSIS — F32A Depression, unspecified: Secondary | ICD-10-CM | POA: Diagnosis present

## 2020-03-26 DIAGNOSIS — R000 Tachycardia, unspecified: Secondary | ICD-10-CM | POA: Diagnosis not present

## 2020-03-26 DIAGNOSIS — I774 Celiac artery compression syndrome: Secondary | ICD-10-CM | POA: Diagnosis not present

## 2020-03-26 DIAGNOSIS — Z8673 Personal history of transient ischemic attack (TIA), and cerebral infarction without residual deficits: Secondary | ICD-10-CM

## 2020-03-26 DIAGNOSIS — R41841 Cognitive communication deficit: Secondary | ICD-10-CM | POA: Diagnosis present

## 2020-03-26 DIAGNOSIS — F419 Anxiety disorder, unspecified: Secondary | ICD-10-CM | POA: Diagnosis present

## 2020-03-26 LAB — CBC WITH DIFFERENTIAL/PLATELET
Abs Immature Granulocytes: 0.04 10*3/uL (ref 0.00–0.07)
Basophils Absolute: 0 10*3/uL (ref 0.0–0.1)
Basophils Relative: 0 %
Eosinophils Absolute: 0 10*3/uL (ref 0.0–0.5)
Eosinophils Relative: 0 %
HCT: 40.9 % (ref 36.0–46.0)
Hemoglobin: 12.6 g/dL (ref 12.0–15.0)
Immature Granulocytes: 0 %
Lymphocytes Relative: 4 %
Lymphs Abs: 0.4 10*3/uL — ABNORMAL LOW (ref 0.7–4.0)
MCH: 26.9 pg (ref 26.0–34.0)
MCHC: 30.8 g/dL (ref 30.0–36.0)
MCV: 87.4 fL (ref 80.0–100.0)
Monocytes Absolute: 0.4 10*3/uL (ref 0.1–1.0)
Monocytes Relative: 4 %
Neutro Abs: 9.4 10*3/uL — ABNORMAL HIGH (ref 1.7–7.7)
Neutrophils Relative %: 92 %
Platelets: 195 10*3/uL (ref 150–400)
RBC: 4.68 MIL/uL (ref 3.87–5.11)
RDW: 16.5 % — ABNORMAL HIGH (ref 11.5–15.5)
WBC: 10.2 10*3/uL (ref 4.0–10.5)
nRBC: 0 % (ref 0.0–0.2)

## 2020-03-26 LAB — COMPREHENSIVE METABOLIC PANEL
ALT: 184 U/L — ABNORMAL HIGH (ref 0–44)
AST: 185 U/L — ABNORMAL HIGH (ref 15–41)
Albumin: 3.4 g/dL — ABNORMAL LOW (ref 3.5–5.0)
Alkaline Phosphatase: 192 U/L — ABNORMAL HIGH (ref 38–126)
Anion gap: 11 (ref 5–15)
BUN: 25 mg/dL — ABNORMAL HIGH (ref 8–23)
CO2: 26 mmol/L (ref 22–32)
Calcium: 9.2 mg/dL (ref 8.9–10.3)
Chloride: 106 mmol/L (ref 98–111)
Creatinine, Ser: 0.66 mg/dL (ref 0.44–1.00)
GFR, Estimated: >60 mL/min (ref >60)
Glucose, Bld: 117 mg/dL — ABNORMAL HIGH (ref 70–99)
Potassium: 4.2 mmol/L (ref 3.5–5.1)
Sodium: 143 mmol/L (ref 135–145)
Total Bilirubin: 0.8 mg/dL (ref 0.3–1.2)
Total Protein: 6.9 g/dL (ref 6.5–8.1)

## 2020-03-26 LAB — RESPIRATORY PANEL BY PCR

## 2020-03-26 LAB — URINALYSIS, ROUTINE W REFLEX MICROSCOPIC
Bilirubin Urine: NEGATIVE
Glucose, UA: NEGATIVE mg/dL
Hgb urine dipstick: NEGATIVE
Ketones, ur: NEGATIVE mg/dL
Nitrite: NEGATIVE
Protein, ur: NEGATIVE mg/dL
Specific Gravity, Urine: 1.025 (ref 1.005–1.030)
pH: 5.5 (ref 5.0–8.0)

## 2020-03-26 LAB — URINALYSIS, MICROSCOPIC (REFLEX)

## 2020-03-26 LAB — TSH: TSH: 0.862 u[IU]/mL (ref 0.350–4.500)

## 2020-03-26 LAB — I-STAT VENOUS BLOOD GAS, ED
Acid-Base Excess: 1 mmol/L (ref 0.0–2.0)
Bicarbonate: 28.3 mmol/L — ABNORMAL HIGH (ref 20.0–28.0)
Calcium, Ion: 1.23 mmol/L (ref 1.15–1.40)
HCT: 39 % (ref 36.0–46.0)
Hemoglobin: 13.3 g/dL (ref 12.0–15.0)
O2 Saturation: 90 %
Potassium: 4.1 mmol/L (ref 3.5–5.1)
Sodium: 144 mmol/L (ref 135–145)
TCO2: 30 mmol/L (ref 22–32)
pCO2, Ven: 52.9 mmHg (ref 44.0–60.0)
pH, Ven: 7.337 (ref 7.250–7.430)
pO2, Ven: 64 mmHg — ABNORMAL HIGH (ref 32.0–45.0)

## 2020-03-26 LAB — LACTIC ACID, PLASMA
Lactic Acid, Venous: 1.3 mmol/L (ref 0.5–1.9)
Lactic Acid, Venous: 1.2 mmol/L (ref 0.5–1.9)

## 2020-03-26 LAB — RESPIRATORY PANEL BY RT PCR (FLU A&B, COVID)
Influenza A by PCR: NEGATIVE
Influenza B by PCR: NEGATIVE
SARS Coronavirus 2 by RT PCR: NEGATIVE

## 2020-03-26 LAB — TROPONIN I (HIGH SENSITIVITY)
Troponin I (High Sensitivity): 11 ng/L (ref <18)
Troponin I (High Sensitivity): 14 ng/L (ref <18)

## 2020-03-26 LAB — LIPASE, BLOOD: Lipase: 44 U/L (ref 11–51)

## 2020-03-26 LAB — CK: Total CK: 176 U/L (ref 38–234)

## 2020-03-26 LAB — GAMMA GT: GGT: 74 U/L — ABNORMAL HIGH (ref 7–50)

## 2020-03-26 LAB — MAGNESIUM: Magnesium: 1.3 mg/dL — ABNORMAL LOW (ref 1.7–2.4)

## 2020-03-26 LAB — AMMONIA: Ammonia: 11 umol/L (ref 9–35)

## 2020-03-26 MED ORDER — SODIUM CHLORIDE 0.9 % IV SOLN
2.0000 g | INTRAVENOUS | Status: DC
  Administered 2020-03-26 – 2020-03-28 (×3): 2 g via INTRAVENOUS
  Filled 2020-03-26: qty 20
  Filled 2020-03-26: qty 2
  Filled 2020-03-26 (×2): qty 20

## 2020-03-26 MED ORDER — MILK AND MOLASSES ENEMA
1.0000 | Freq: Once | RECTAL | Status: AC
  Administered 2020-03-27: 240 mL via RECTAL
  Filled 2020-03-26 (×2): qty 240

## 2020-03-26 MED ORDER — SODIUM CHLORIDE 0.9 % IV SOLN
500.0000 mg | INTRAVENOUS | Status: DC
  Administered 2020-03-26: 500 mg via INTRAVENOUS
  Filled 2020-03-26: qty 500

## 2020-03-26 MED ORDER — STROKE: EARLY STAGES OF RECOVERY BOOK: Freq: Once | Status: DC

## 2020-03-26 MED ORDER — ACETAMINOPHEN 325 MG PO TABS: 650.0000 mg | ORAL_TABLET | Freq: Four times a day (QID) | ORAL | Status: DC | PRN

## 2020-03-26 MED ORDER — BISACODYL 10 MG RE SUPP: 10.0000 mg | Freq: Every day | RECTAL | Status: DC | PRN

## 2020-03-26 MED ORDER — LACTATED RINGERS IV BOLUS
30.0000 mL/kg | Freq: Once | INTRAVENOUS | Status: AC
  Administered 2020-03-26: 1851 mL via INTRAVENOUS

## 2020-03-26 MED ORDER — DIVALPROEX SODIUM 125 MG PO CSDR
125.0000 mg | DELAYED_RELEASE_CAPSULE | Freq: Two times a day (BID) | ORAL | Status: DC
  Administered 2020-03-28 – 2020-03-29 (×4): 125 mg via ORAL
  Filled 2020-03-26 (×9): qty 1

## 2020-03-26 MED ORDER — SODIUM CHLORIDE 0.9 % IV SOLN
75.0000 mL/h | INTRAVENOUS | Status: AC
  Administered 2020-03-26: 75 mL/h via INTRAVENOUS

## 2020-03-26 MED ORDER — ALBUTEROL SULFATE HFA 108 (90 BASE) MCG/ACT IN AERS
2.0000 | INHALATION_SPRAY | Freq: Every day | RESPIRATORY_TRACT | Status: DC
  Administered 2020-03-28 – 2020-03-30 (×3): 2 via RESPIRATORY_TRACT
  Filled 2020-03-26: qty 6.7

## 2020-03-26 MED ORDER — ACETAMINOPHEN 650 MG RE SUPP: 650.0000 mg | Freq: Four times a day (QID) | RECTAL | Status: DC | PRN

## 2020-03-26 MED ORDER — METRONIDAZOLE IN NACL 5-0.79 MG/ML-% IV SOLN
500.0000 mg | Freq: Three times a day (TID) | INTRAVENOUS | Status: DC
  Administered 2020-03-26 – 2020-03-27 (×2): 500 mg via INTRAVENOUS
  Filled 2020-03-26 (×2): qty 100

## 2020-03-26 MED ORDER — ACETAMINOPHEN 650 MG RE SUPP
650.0000 mg | Freq: Once | RECTAL | Status: AC
  Administered 2020-03-26: 650 mg via RECTAL
  Filled 2020-03-26: qty 1

## 2020-03-26 MED ORDER — IOHEXOL 350 MG/ML SOLN
75.0000 mL | Freq: Once | INTRAVENOUS | Status: AC | PRN
  Administered 2020-03-26: 75 mL via INTRAVENOUS

## 2020-03-26 MED ORDER — ASPIRIN 300 MG RE SUPP
300.0000 mg | Freq: Once | RECTAL | Status: AC
  Administered 2020-03-26: 300 mg via RECTAL
  Filled 2020-03-26: qty 1

## 2020-03-26 NOTE — ED Triage Notes (Signed)
Pt from [facility] for AMS, LSN 1130am. Pt at baseline with dementia, nonverbal but follows simple commands. Pt arrives with eyes open, responsive to pain. VSS, rhonchi noted

## 2020-03-26 NOTE — H&P (Signed)
Dai Apel ZOX:[id number] DOB: [birth year] DOA: 03/26/2020     PCP: Laurann Montana, MD   Outpatient Specialists:     NEurology    Dr. Anne Hahn    Patient arrived to ER on 03/26/20 at 1349 Referred by Attending Pollyann Savoy, MD   Patient coming from  From facility  [facility]  Chief Complaint:   Chief Complaint  Patient presents with  . Altered Mental Status    HPI: Deeandra Jerry is a 79 y.o. female with medical history significant of dementia, HTN, HLD, CAD sp CABG sp porcine valve, CABG in 2008, chronic constipation, depression recurrent falls, seizure-like activities, myoclonus    Presented with altered mental status.  Patient appeared to be poorly responsive initially.  Noted to have fever at baseline patient is pleasantly confused patient this time is total care end-stage Alzheimer's patient have had frequent falls prior to her placement at nursing home facility fracture of the left leg in 2020 resulting in long-term stay at SNF.  Infectious risk factors:  Reports fever,  Has   been vaccinated against COVID back in January 2021   Initial COVID TEST  NEGATIVE  Lab Results  Component Value Date   SARSCOV2NAA NEGATIVE 03/26/2020   SARSCOV2NAA NEGATIVE 02/07/2019   SARSCOV2NAA NEGATIVE 02/01/2019    Regarding pertinent Chronic problems:  Hyperlipidemia - on statins Zocor Lipid Panel     Component Value Date/Time   CHOL 162 02/01/2019 2233   TRIG 51 02/01/2019 2233   HDL 72 02/01/2019 2233   CHOLHDL 2.3 02/01/2019 2233   VLDL 10 02/01/2019 2233   LDLCALC 80 02/01/2019 2233   Seizure-like activity on Depakote  HTN on Coreg  CAD  - On Aspirin, statin, betablocker,         Asthma -well  controlled on home inhalers   Dementia - on Aricept Nemenda  While in ER: Initially noted to be tachycardic up to 104 febrile up to 100.9 Noted elevated LFTs Chest x-ray show widened mediastinum She had follow-up CTA of her chest that showed 6.3 cm ascending  aortic aneurysm This was discussed with family who stated patient has MOST form they wish for IV fluids and antibiotics as needed but no aggressive interventions In particular no surgical intervention regarding patient's aneurysm as per ER providers discussion with the family  UA showed presence of bacteria Imaging showed obstipation as well as some gallbladder wall abnormality Patient was also noted to be transiently hypoxic started on 3 L CT scan showed no evidence of new infiltrates although some plexuses was present   Hospitalist was called for admission for SIRS unclear source, LFT elevation possible early UTI vs Cholecystitis  The following Work up has been ordered so far:  Orders Placed This Encounter  Procedures  . Respiratory Panel by RT PCR (Flu A&B, Covid) - Nasopharyngeal Swab  . Urine culture  . Blood culture (routine x 2)  . Respiratory Panel by PCR  . CT Head Wo Contrast  . DG Chest Portable 1 View  . CT Angio Chest/Abd/Pel for Dissection W and/or Wo Contrast  . CBC with Differential  . Comprehensive metabolic panel  . Lactic acid, plasma  . Lipase, blood  . Ammonia  . Urinalysis, Routine w reflex microscopic  . TSH  . Blood gas, arterial  . Urinalysis, Microscopic (reflex)  . Cardiac monitoring  . Check Rectal Temperature  . Do not attempt resuscitation/DNR  . Consult to hospitalist  ALL PATIENTS BEING ADMITTED/HAVING PROCEDURES NEED COVID-19 SCREENING  .  I-Stat venous blood gas, [facility])    Following Medications were ordered in ER: Medications  cefTRIAXone (ROCEPHIN) 2 g in sodium chloride 0.9 % 100 mL IVPB (0 g Intravenous Stopped 03/26/20 1807)  azithromycin (ZITHROMAX) 500 mg in sodium chloride 0.9 % 250 mL IVPB (500 mg Intravenous New Bag/Given 03/26/20 1818)  iohexol (OMNIPAQUE) 350 MG/ML injection 75 mL (75 mLs Intravenous Contrast Given 03/26/20 1728)  lactated ringers bolus 1,851 mL (1,851 mLs Intravenous New Bag/Given 03/26/20 1841)  acetaminophen  (TYLENOL) suppository 650 mg (650 mg Rectal Given 03/26/20 1841)        Consult Orders  (From admission, onward)         Start     Ordered   03/26/20 1828  Consult to hospitalist  ALL PATIENTS BEING ADMITTED/HAVING PROCEDURES NEED COVID-19 SCREENING  Once       Comments: ALL PATIENTS BEING ADMITTED/HAVING PROCEDURES NEED COVID-19 SCREENING  Provider:  (Not yet assigned)  Question Answer Comment  Place call to: [facility]   Reason for Consult Admit      03/26/20 1827         Significant initial  Findings: Abnormal Labs Reviewed  CBC WITH DIFFERENTIAL/PLATELET - Abnormal; Notable for the following components:      Result Value   RDW 16.5 (*)    Neutro Abs 9.4 (*)    Lymphs Abs 0.4 (*)    All other components within normal limits  COMPREHENSIVE METABOLIC PANEL - Abnormal; Notable for the following components:   Glucose, Bld 117 (*)    BUN 25 (*)    Albumin 3.4 (*)    AST 185 (*)    ALT 184 (*)    Alkaline Phosphatase 192 (*)    All other components within normal limits  URINALYSIS, ROUTINE W REFLEX MICROSCOPIC - Abnormal; Notable for the following components:   Leukocytes,Ua SMALL (*)    All other components within normal limits  URINALYSIS, MICROSCOPIC (REFLEX) - Abnormal; Notable for the following components:   Bacteria, UA MANY (*)    All other components within normal limits  I-STAT VENOUS BLOOD GAS, ED - Abnormal; Notable for the following components:   pO2, Ven 64.0 (*)    Bicarbonate 28.3 (*)    All other components within normal limits    Otherwise labs showing:   Recent Labs  Lab 03/26/20 1416 03/26/20 1517  NA 143 144  K 4.2 4.1  CO2 26  --   GLUCOSE 117*  --   BUN 25*  --   CREATININE 0.66  --   CALCIUM 9.2  --    Cr    stable,    Lab Results  Component Value Date   CREATININE 0.66 03/26/2020   CREATININE 0.82 02/08/2019   CREATININE 0.49 02/07/2019    Recent Labs  Lab 03/26/20 1416  AST 185*  ALT 184*  ALKPHOS 192*  BILITOT  0.8  PROT 6.9  ALBUMIN 3.4*   Lab Results  Component Value Date   CALCIUM 9.2 03/26/2020   PHOS 3.3 02/01/2019    WBC      Component Value Date/Time   WBC 10.2 03/26/2020 1416   LYMPHSABS 0.4 (L) 03/26/2020 1416   MONOABS 0.4 03/26/2020 1416   EOSABS 0.0 03/26/2020 1416   BASOSABS 0.0 03/26/2020 1416    Plt: Lab Results  Component Value Date   PLT 195 03/26/2020   Lactic Acid, Venous    Component Value Date/Time   LATICACIDVEN 1.2 03/26/2020 1816  Procalcitonin   Ordered   Venous  Blood Gas result:  PH 7.337 pCO2 52.9    HG/HCT  stable,       Component Value Date/Time   HGB 13.3 03/26/2020 1517   HCT 39.0 03/26/2020 1517   MCV 87.4 03/26/2020 1416    Recent Labs  Lab 03/26/20 1416  LIPASE 44   Recent Labs  Lab 03/26/20 1417  AMMONIA 11   Troponin 11   ECG: Ordered      UA? evidence of UTI  bACTERIA PRESENT MILD wbc LEVATION   Urine analysis:    Component Value Date/Time   COLORURINE YELLOW 03/26/2020 1517   APPEARANCEUR CLEAR 03/26/2020 1517   LABSPEC 1.025 03/26/2020 1517   PHURINE 5.5 03/26/2020 1517   GLUCOSEU NEGATIVE 03/26/2020 1517   HGBUR NEGATIVE 03/26/2020 1517   BILIRUBINUR NEGATIVE 03/26/2020 1517   KETONESUR NEGATIVE 03/26/2020 1517   PROTEINUR NEGATIVE 03/26/2020 1517   NITRITE NEGATIVE 03/26/2020 1517   LEUKOCYTESUR SMALL (A) 03/26/2020 1517      Ordered  CT HEAD abnormal showing possible CVA and distal basilar artery suspicious for possible endoluminal thrombus  CXR - concerning for aneurysm of the ascending aorta.  CTabd/pelvis -  Obstipation mild gallbladder wall thickening  CTA chest -  no evidence of infiltrate/ atelectasis  emphysema changes Ascending thoracic aortic aneurysm measuring approximately 6.3 cm in diameter.     ED Triage Vitals  Enc Vitals Group     BP 03/26/20 1433 105/88     Pulse Rate 03/26/20 1433 93     Resp 03/26/20 1434 (!) 22     Temp 03/26/20 1433 (!) 97.4 F (36.3 C)     Temp  Source 03/26/20 1433 Temporal     SpO2 03/26/20 1433 100 %     Weight 03/26/20 1434 136 lb 0.4 oz (61.7 kg)     Height 03/26/20 1434  (1.575 m)     Head Circumference --      Peak Flow --      Pain Score --      Pain Loc --      Pain Edu? --      Excl. in GC? --   TMAX(24)@       Latest  Blood pressure 122/80, pulse (!) 103, temperature (!) 100.9 F (38.3 C), temperature source Rectal, resp. rate (!) 29, height  (1.575 m), weight 61.7 kg, SpO2 99 %.   Review of Systems:    Pertinent positives include:  Fevers, chills, fatigue,   Constitutional:  No weight loss, night sweats, weight loss confusion    Unable to do full systems review as patient is unable to provide history     Past Medical History:   Past Medical History:  Diagnosis Date  . Abnormal posture   . Anxiety   . Atherosclerotic heart disease of native coronary artery without angina pectoris   . Cognitive communication deficit   . Depression   . Falls frequently   . Fracture    right lower leg   . Hyperlipidemia   . Hypertension   . Repeated falls   . Unspecified dementia without behavioral disturbance [facility])     Past Surgical History:  Procedure Laterality Date  . CORONARY ARTERY BYPASS GRAFT    . NECK SURGERY     Neck bone replacement   . VALVE REPLACEMENT      Social History:   bed bound, total care     reports that she quit smoking  about 13 years ago. She has never used smokeless tobacco. She reports previous alcohol use. She reports that she does not use drugs.   Family History:   Family History  Problem Relation Age of Onset  . Hypertension Father     Allergies: No Known Allergies   Prior to Admission medications   Medication Sig Start Date End Date Taking? Authorizing Provider  acetaminophen (TYLENOL) 500 MG tablet Take 1,000 mg by mouth every 8 (eight) hours.    Yes [provider]  albuterol (VENTOLIN HFA) 108 (90 Base) MCG/ACT inhaler Inhale 2 puffs into the  lungs daily. Also 2 puffs via spacer twice daily as needed for wheezing.   Yes [provider]  ASPIRIN 81 PO Take 81 mg by mouth daily.    Yes [provider]  calcium carbonate (TUMS - DOSED IN MG ELEMENTAL CALCIUM) 500 MG chewable tablet Chew 2 tablets by mouth daily.   Yes [provider]  carvedilol (COREG) 6.25 MG tablet Take 6.25 mg by mouth at bedtime.  12/26/18  Yes [provider]  cholecalciferol (VITAMIN D3) 25 MCG (1000 UNIT) tablet Take 1,000 Units by mouth daily.   Yes [provider]  divalproex (DEPAKOTE) 125 MG DR tablet Take 125 mg by mouth 2 (two) times daily.   Yes [provider]  docusate sodium (COLACE) 100 MG capsule Take 100 mg by mouth daily.   Yes [provider]  escitalopram (LEXAPRO) 20 MG tablet Take 20 mg by mouth daily. 08/02/18  Yes [provider]  fluticasone (FLONASE) 50 MCG/ACT nasal spray Place 2 sprays into both nostrils daily.   Yes [provider]  gabapentin (NEURONTIN) 100 MG capsule Take 300 mg by mouth 2 (two) times daily.   Yes [provider]  guaiFENesin (MUCINEX) 600 MG 12 hr tablet Take 600 mg by mouth every 12 (twelve) hours.   Yes [provider]  hydrOXYzine (ATARAX/VISTARIL) 10 MG tablet Take 10 mg by mouth in the morning and at bedtime.   Yes [provider]  Lidocaine 4 % PTCH Apply topically. Apply one patch to back in the AM remove at bedtime.   Yes [provider]  memantine (NAMENDA) 5 MG tablet Take 5 mg by mouth at bedtime. 03/18/20  Yes [provider]  polyvinyl alcohol (LIQUIFILM TEARS) 1.4 % ophthalmic solution Place 1 drop into both eyes 2 (two) times daily as needed for dry eyes.   Yes [provider]  simvastatin (ZOCOR) 40 MG tablet Take 40 mg by mouth every evening. 06/30/18  Yes [provider]  vitamin B-12 (CYANOCOBALAMIN) 1000 MCG tablet Take 1,000 mcg by mouth daily.   Yes [provider]   Physical Exam: Vitals with BMI 03/26/2020 03/26/2020 03/26/2020  Height - - -  Weight - - -  BMI - - -  Systolic 122 132 -  Diastolic 80 105 -  Pulse 103 106 104     1. General:  in No Acute distress    Chronically ill -appearing 2. Psychological: Alert and  Oriented 3. Head/ENT:    Dry Mucous Membranes                          Head Non traumatic, neck supple                         Poor Dentition  Red conjuctiva 4. SKIN:   decreased Skin turgor,  Skin clean Dry and intact mottled 5. Heart: Regular rate and rhythm no  Murmur, no Rub or gallop 6. Lungs: no wheezes or crackles   7. Abdomen: Soft, tender ??  Patient unable to verbalize but appears to be more in distress when abdomen pressed, Non distended  8. Lower extremities: no clubbing, cyanosis, no edema 9. Neurologically Grossly intact, moving all 4 extremities equally  10. MSK: Normal range of motion   All other LABS:     Recent Labs  Lab 03/26/20 1416 03/26/20 1517  WBC 10.2  --   NEUTROABS 9.4*  --   HGB 12.6 13.3  HCT 40.9 39.0  MCV 87.4  --   PLT 195  --      Recent Labs  Lab 03/26/20 1416 03/26/20 1517  NA 143 144  K 4.2 4.1  CL 106  --   CO2 26  --   GLUCOSE 117*  --   BUN 25*  --   CREATININE 0.66  --   CALCIUM 9.2  --      Recent Labs  Lab 03/26/20 1416  AST 185*  ALT 184*  ALKPHOS 192*  BILITOT 0.8  PROT 6.9  ALBUMIN 3.4*       Cultures:    Component Value Date/Time   SDES  08/08/2018 1115    URINE, CLEAN CATCH Performed at [facility], [address], [place], [place] [address]    SPECREQUEST  08/08/2018 1115    Normal Performed at [facility], [address], [place], [place] [address]    CULT >=100,000 COLONIES/mL ESCHERICHIA COLI (A) 08/08/2018 1115   REPTSTATUS 08/10/2018 FINAL 08/08/2018 1115     Radiological Exams on Admission: DG Chest Portable 1 View  Result Date:  03/26/2020 CLINICAL DATA:  Hypoxia, altered mental status. EXAM: PORTABLE CHEST 1 VIEW COMPARISON:  None. FINDINGS: Bulging contour of the ascending aorta, concerning for aneurysm. Aortic atherosclerosis. Mild enlargement the cardiac silhouette. Postsurgical changes of CABG with median sternotomy. Linear right basilar opacities. No confluent consolidation. No visible pleural effusions or pneumothorax. No acute osseous abnormality. IMPRESSION: 1. Findings concerning for aneurysm of the ascending aorta. Recommend CTA chest to further evaluate. 2. Linear right basilar opacities, favor atelectasis. Findings discussed with Dr. Julieanne Manson Via telephone at 2:44 PM. Electronically Signed   By: Feliberto Harts MD   On: 03/26/2020 14:47   CT Angio Chest/Abd/Pel for Dissection W and/or Wo Contrast  Result Date: 03/26/2020 CLINICAL DATA:  Widening of the mediastinum. EXAM: CT ANGIOGRAPHY CHEST, ABDOMEN AND PELVIS TECHNIQUE: Non-contrast CT of the chest was initially obtained. Multidetector CT imaging through the chest, abdomen and pelvis was performed using the standard protocol during bolus administration of intravenous contrast. Multiplanar reconstructed images and MIPs were obtained and reviewed to evaluate the vascular anatomy. CONTRAST:  75mL OMNIPAQUE IOHEXOL 350 MG/ML SOLN COMPARISON:  None. FINDINGS: CTA CHEST FINDINGS Cardiovascular: There is an ascending thoracic aortic aneurysm measuring approximately 6.3 cm in diameter. There is no CT evidence for a dissection, however evaluation is limited by contrast timing. The sino-tubular junction measures up to approximately 5 cm in diameter. Atherosclerotic changes are noted of the thoracic aorta. The arch vessels appear to be grossly patent where visualized. The main pulmonary artery is dilated measuring 3.4 cm. Contrast bolus timing is suboptimal for the detection of acute pulmonary emboli. The patient is status post prior median sternotomy and aortic valve replacement.  Mediastinum/Nodes: --  No mediastinal lymphadenopathy. -- No hilar lymphadenopathy. -- No axillary lymphadenopathy. -- No supraclavicular lymphadenopathy. -- Normal thyroid gland where visualized. -there is a moderate-sized hiatal hernia. Lungs/Pleura: There are moderate emphysematous changes bilaterally. The lung fields are suboptimally evaluated secondary to respiratory motion artifact. There is an airspace opacity in the right lower lobe that appears to be enhancing and therefore is favored to represent an area of atelectasis. There is no pneumothorax or large pleural effusion. The trachea is unremarkable. Musculoskeletal: There are old healed left-sided rib fractures. No acute displaced fracture identified on today's study. Review of the MIP images confirms the above findings. CTA ABDOMEN AND PELVIS FINDINGS VASCULAR Aorta: There are atherosclerotic changes throughout the abdominal aorta without evidence for an aneurysm. Celiac: There is atherosclerotic disease at the origin of the celiac axis resulting in mild stenosis. SMA: Patent without evidence of aneurysm, dissection, vasculitis or significant stenosis. 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 There is a moderate grade stenosis of the left renal artery. IMA: Patent without evidence of aneurysm, dissection, vasculitis or significant stenosis. Inflow: There is moderate narrowing throughout the bilateral common iliac arteries, right worse than left. There is moderate narrowing throughout the bilateral external iliac arteries, right worse than left. 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 There is complete occlusion of the proximal left SFA. Veins: No obvious venous abnormality within the limitations of this arterial phase study. Review of the MIP images confirms the above findings. NON-VASCULAR Hepatobiliary: The liver is normal. There is questionable mild gallbladder wall thickening, suboptimally evaluated secondary to  streak artifact from the patient's arms.There is no biliary ductal dilation. Pancreas: Normal contours without ductal dilatation. No peripancreatic fluid collection. Spleen: Unremarkable. Adrenals/Urinary Tract: --Adrenal glands: Unremarkable. --Right kidney/ureter: No hydronephrosis or radiopaque kidney stones. --Left kidney/ureter: No hydronephrosis or radiopaque kidney stones. --Urinary bladder: Unremarkable. Stomach/Bowel: --Stomach/Duodenum: No hiatal hernia or other gastric abnormality. Normal duodenal course and caliber. --Small bowel: Unremarkable. --Colon: There is a large amount of stool at the level of the rectum. --Appendix: Normal. Lymphatic: --No retroperitoneal lymphadenopathy. --No mesenteric lymphadenopathy. --No pelvic or inguinal lymphadenopathy. Reproductive: Unremarkable Other: No ascites or free air. The abdominal wall is normal. Musculoskeletal. There is a chronic appearing compression fracture of L1 vertebral body. Multilevel degenerative changes are noted throughout the visualized thoracolumbar spine. No evidence for an acute fracture on today's study. Review of the MIP images confirms the above findings. IMPRESSION: 1. Ascending thoracic aortic aneurysm measuring approximately 6.3 cm in diameter. There is no CT evidence for a dissection, however evaluation is limited by contrast timing. Surgical consultation is recommended. 2. Vascular disease involving the abdomen and bilateral lower extremities as detailed above. 3. Very large amount of stool at the level of the rectum. 4. Questionable mild gallbladder wall thickening. Correlation with laboratory studies is recommended. 5. Right basilar atelectasis. 6. Status post CABG and aortic valve replacement. Aortic Atherosclerosis (ICD10-I70.0) and Emphysema (ICD10-J43.9). Electronically Signed   By: Katherine Mantle M.D.   On: 03/26/2020 18:02    Chart has been reviewed    Assessment/Plan  79 y.o. female with medical history significant  of dementia, HTN, HLDCAD, CABG in 2008, chronic constipation, depression recurrent falls, seizure-like activities, myoclonus Admitted for SIRS, acute encephalopathy   Present on Admission: . SIRS (systemic inflammatory response syndrome) (HCC) -   tachycardia   ,  Fever RR >20 Today's Vitals   03/26/20 1517 03/26/20 1522 03/26/20 1721 03/26/20 1840  BP:   (!) 132/105  122/80  Pulse:  (!) 104 (!) 106 (!) 103  Resp:  (!) 24 (!) 26 (!) 29  Temp: (!) 100.9 F (38.3 C)     TempSrc: Rectal     SpO2:  97% 99% 99%  Weight:      Height:        -Most likely source being  Urinary, vs intra-abdominal,    Patient meeting criteria for Severe sepsis with  acute metabolic encephalopathy   Acute hypoxia requiring new supplemental oxygen, SpO2: 99 % O2 Flow Rate (L/min): 2 L/min     - Obtain serial lactic acid and procalcitonin level.  - Initiated IV antibiotics   - await results of blood and urine culture  - Rehydrate aggressively          Per family ok to give Iv antibiotics and IV fluids but would like to avoid aggressive interventions 7:57 PM    Abnormal CT possible CVA -spoke at length with family at this point the do not want to pursue this further, hold off on MRI as patient unlikely to tolerate at this point.  Would start on aspirin when able to take either p.o. or rectally.  Currently patient is severely obstipated and will need bowel regimen and unable to take p.o. secondary to altered mental status Family did not wish neurology consult to be called  . Ascending aortic aneurysm (HCC) - Per family pt did not wish to undergo invasive procedures, would avoid hypertension and resume BB when BP allows  . Hyperlipidemia - chronic stable resume home meds when patient s able to tolerate  . Essential hypertension Home medications on hold as patient was initially hypotensive  . Dementia (HCC) -resume home medications when able to tolerate  .possible  Acute lower UTI -await results of urine  culture, for now continue rocephin  . Obstipation -will order bowel regimen May need enema and repeat KUB in a.m.   . Acute metabolic encephalopathy -most likely multifactorial secondary to combination of  infection   mild dehydration secondary to decreased by mouth intake,   - Will rehydrate   - treat underlining infection    - CT showing possible CVA patient is out of the window for TPA   Family would like to hold off on further work up at this Time  family do not wish for any aggressive intervention      - neurological exam appears to be nonfocal but patient unable to cooperate fully   - VBG unremarkable no evidence of hypercarbia    - no history of liver disease ammonia unremarkable  . Acute respiratory failure with hypoxia (HCC) -likely multifactorial patient chest CT did show evidence of emphysematous changes as well atelectasis. No evidence of infectious process noted. Continue to monitor provide oxygen as needed  . Elevated LFTs -abnormal CT showed possible cholecystitis obtain right upper quadrant ultrasound. Also check CK level, for completion obtain hepatitis panel in a.m. RUQ US showing no cholecystitis 10:13 PM Await results of CK  Other plan as per orders.  DVT prophylaxis:  SCD       Code Status:    Code Status: DNR   DNR/DNI  as per  family  I had personally discussed CODE STATUS with  family   Family Communication:   Family not at  Bedside  plan of care was discussed on the phone with Daughter  Disposition Plan:  Back to current facility when stable                             Following barriers for discharge:                                                         Pain controlled with PO medications                               Afebrile,  able to transition to PO antibiotics                             Will need to be able to tolerate PO                    Would benefit from PT/OT eval prior to DC  Ordered                   Swallow  eval - SLP ordered                                     Transition of care consulted                   Nutrition    consulted                                   Palliative care    consulted                                       Consults called: none   Admission status:  ED Disposition    ED Disposition Condition Comment   Admit  The patient appears reasonably stabilized for admission considering the current resources, flow, and capabilities available in the ED at this time, and I doubt any other [facility] requiring further screening and/or treatment in the ED prior to admission is  present.       Obs     Level of care  tele  For   24H    Lab Results  Component Value Date   SARSCOV2NAA NEGATIVE 03/26/2020     Precautions: admitted as  Covid Negative     PPE: Used by the provider:   P100  eye Goggles,  Gloves     Lamichael Youkhana 03/26/2020, 10:14 PM  [facility]     after 2 AM please page floor coverage PA If 7AM-7PM, please contact the day team taking care of the patient using [email or web address]   Patient was evaluated in the context of the global COVID-19 pandemic, which necessitated consideration that the patient might be at risk for infection with the SARS-CoV-2 virus that causes COVID-19. Institutional protocols and algorithms that pertain to the evaluation of patients at risk for COVID-19 are in a state of rapid change based on information released by regulatory bodies including the CDC and federal and state organizations. These policies  and algorithms were followed during the patient's care.

## 2020-03-26 NOTE — ED Provider Notes (Signed)
Care of the patient assumed at the change of shift. Here for AMS, has DNR and MOST form.  Physical Exam  BP (!) 150/99   Pulse 95   Temp (!) 100.9 F (38.3 C) (Rectal)   Resp (!) 29   Ht 5\' 2"  (1.575 m)   Wt 61.7 kg   SpO2 100%   BMI 24.88 kg/m   Physical Exam  ED Course/Procedures   Clinical Course as of Mar 26 1848  Thu Mar 26, 2020  1556 CMP with mildly elevated LFTs, Lipase and Trop normal.    [CS]  1615 UA with possible source of infection. Given Rocephin/Zithromax for CAP which should also cover UA pending culture.    [CS]  1705 Covid is neg.    [CS]  1827 CTA images and results reviewed. Discussed patient's current findings with daughter who advises patient would not want any kind of heart surgery and so for now we will just continue to watch her closely. BP is now a bit lower and so LR bolus ordered. Will admit to Medicine for further evaluation.    [CS]  1848 Spoke with Dr. Mar 28, 2020, Hospitalist, who will evaluate the patient for admission. She requests full RVP.    [CS]    Clinical Course User Index [CS] Kara Pacer, MD    Procedures  MDM  Abnormal CXR, pending CTA for widened mediastinum. Also now found to be febrile, has normal WBC and lactic acid.       Pollyann Savoy, MD 03/26/20 [phone]

## 2020-03-26 NOTE — ED Provider Notes (Signed)
[facility] Provider Note   CSN: [id number] Arrival date & time: 03/26/20  1349     History Chief Complaint  Patient presents with  . Altered Mental Status    Jasmine Carrillo is a 79 y.o. female.  The history is provided by the EMS personnel and medical records. The history is limited by the condition of the patient.  Altered Mental Status Presenting symptoms: unresponsiveness   Severity:  Severe Most recent episode:  Today Episode history:  Single Timing:  Constant Progression:  Unchanged Chronicity:  New Context: nursing home resident   Associated symptoms: fever        Past Medical History:  Diagnosis Date  . Abnormal posture   . Anxiety   . Atherosclerotic heart disease of native coronary artery without angina pectoris   . Cognitive communication deficit   . Depression   . Falls frequently   . Fracture    right lower leg   . Hyperlipidemia   . Hypertension   . Repeated falls   . Unspecified dementia without behavioral disturbance [facility](HCC)     Patient Active Problem List   Diagnosis Date Noted  . Essential hypertension 02/07/2019  . Hyperlipidemia 02/07/2019  . Dementia (HCC) 02/07/2019  . Closed right ankle fracture 02/07/2019  . Other fracture of left great toe, initial encounter for closed fracture 02/07/2019  . Recurrent falls 02/01/2019    Past Surgical History:  Procedure Laterality Date  . CORONARY ARTERY BYPASS GRAFT    . NECK SURGERY     Neck bone replacement   . VALVE REPLACEMENT       OB History   No obstetric history on file.     No family history on file.  Social History   Tobacco Use  . Smoking status: Former Smoker    Quit date: 07/29/2006    Years since quitting: 13.6  . Smokeless tobacco: Never Used  Vaping Use  . Vaping Use: Never used  Substance Use Topics  . Alcohol use: Not Currently    Comment: wine 1/2 twice a week   . Drug use: No    Home Medications Prior to Admission  medications   Medication Sig Start Date End Date Taking? Authorizing Provider  acetaminophen (TYLENOL) 500 MG tablet Take 1,000 mg by mouth every 8 (eight) hours as needed.    [provider]  albuterol (VENTOLIN HFA) 108 (90 Base) MCG/ACT inhaler Inhale 2 puffs into the lungs daily. Also 2 puffs via spacer twice daily as needed for wheezing.    [provider]  ASPIRIN 81 PO Take 81 mg by mouth daily.     [provider]  calcium carbonate (TUMS - DOSED IN MG ELEMENTAL CALCIUM) 500 MG chewable tablet Chew 2 tablets by mouth daily.    [provider]  carvedilol (COREG) 6.25 MG tablet Take 6.25 mg by mouth 2 (two) times daily. 12/26/18   [provider]  Cholecalciferol (VITAMIN D3) 25 MCG (1000 UT) CAPS Take 1 capsule by mouth daily.    [provider]  divalproex (DEPAKOTE) 125 MG DR tablet Take 125 mg by mouth 2 (two) times daily.    [provider]  docusate sodium (COLACE) 100 MG capsule Take 100 mg by mouth daily.    [provider]  Ensure (ENSURE) Take 237 mLs by mouth daily.    [provider]  escitalopram (LEXAPRO) 20 MG tablet Take 20 mg by mouth daily. 08/02/18   [provider]  fluticasone (FLONASE) 50 MCG/ACT nasal spray Place 2 sprays into both nostrils daily.    [provider]  gabapentin (NEURONTIN) 100 MG capsule Take 300 mg by mouth 2 (two) times daily.    [provider]  guaiFENesin (MUCINEX) 600 MG 12 hr tablet Take 600 mg by mouth every 12 (twelve) hours.    [provider]  hydrOXYzine (ATARAX/VISTARIL) 10 MG tablet Take 10 mg by mouth in the morning and at bedtime.    [provider]  Lidocaine 4 % PTCH Apply topically. Apply one patch to back in the AM remove at bedtime.    [provider]  memantine (NAMENDA) 10 MG tablet Take 10 mg by mouth at bedtime.  02/24/15   [provider]  polyvinyl alcohol (LIQUIFILM TEARS) 1.4 %  ophthalmic solution Place 1 drop into both eyes 2 (two) times daily as needed for dry eyes.    [provider]  simvastatin (ZOCOR) 40 MG tablet Take 40 mg by mouth every evening. 06/30/18   [provider]  Tretinoin, Facial Wrinkles, (TRETINOIN, EMOLLIENT,) 0.05 % CREA Take 1 application by mouth as needed. 06/20/11   [provider]  vitamin B-12 (CYANOCOBALAMIN) 1000 MCG tablet Take 1,000 mcg by mouth daily.    [provider]    Allergies    Patient has no known allergies.  Review of Systems   Review of Systems  Unable to perform ROS: Patient unresponsive  Constitutional: Positive for fever.  HENT: Negative for congestion.   Respiratory: Positive for cough.     Physical Exam Updated Vital Signs BP (!) 150/99   Pulse (!) 104   Temp (!) 100.9 F (38.3 C) (Rectal)   Resp (!) 24   Ht 5\' 2"  (1.575 m)   Wt 61.7 kg   SpO2 97%   BMI 24.88 kg/m   Physical Exam Vitals and nursing note reviewed.  Constitutional:      General: She is in acute distress.     Appearance: She is well-developed. She is ill-appearing. She is not toxic-appearing or diaphoretic.  HENT:     Head: Normocephalic and atraumatic.     Right Ear: External ear normal.     Left Ear: External ear normal.     Nose: Nose normal.     Mouth/Throat:     Mouth: Mucous membranes are moist.     Pharynx: No oropharyngeal exudate or posterior oropharyngeal erythema.  Eyes:     Extraocular Movements: Extraocular movements intact.     Conjunctiva/sclera: Conjunctivae normal.     Pupils: Pupils are equal, round, and reactive to light.  Cardiovascular:     Rate and Rhythm: Regular rhythm. Tachycardia present.     Pulses: Normal pulses.     Heart sounds: No murmur heard.   Pulmonary:     Effort: Respiratory distress present.     Breath sounds: No stridor. Rhonchi present. No wheezing or rales.  Chest:     Chest wall: No tenderness.  Abdominal:     General: Abdomen is flat.      Tenderness: There is no abdominal tenderness. There is no guarding or rebound.  Musculoskeletal:        General: No tenderness.     Cervical back: Normal range of motion and neck supple. No tenderness.     Right lower leg: No edema.     Left lower leg: No edema.  Skin:    Findings: No erythema or rash.  Neurological:  Mental Status: She is alert.     GCS: GCS eye subscore is 1. GCS verbal subscore is 1. GCS motor subscore is 4.     Motor: No abnormal muscle tone.     Deep Tendon Reflexes: Reflexes are normal and symmetric.     ED Results / Procedures / Treatments   Labs (all labs ordered are listed, but only abnormal results are displayed) Labs Reviewed  CBC WITH DIFFERENTIAL/PLATELET - Abnormal; Notable for the following components:      Result Value   RDW 16.5 (*)    Neutro Abs 9.4 (*)    Lymphs Abs 0.4 (*)    All other components within normal limits  COMPREHENSIVE METABOLIC PANEL - Abnormal; Notable for the following components:   Glucose, Bld 117 (*)    BUN 25 (*)    Albumin 3.4 (*)    AST 185 (*)    ALT 184 (*)    Alkaline Phosphatase 192 (*)    All other components within normal limits  I-STAT VENOUS BLOOD GAS, ED - Abnormal; Notable for the following components:   pO2, Ven 64.0 (*)    Bicarbonate 28.3 (*)    All other components within normal limits  RESPIRATORY PANEL BY RT PCR (FLU A&B, COVID)  URINE CULTURE  CULTURE, BLOOD (ROUTINE X 2)  CULTURE, BLOOD (ROUTINE X 2)  LACTIC ACID, PLASMA  LIPASE, BLOOD  AMMONIA  TSH  LACTIC ACID, PLASMA  URINALYSIS, ROUTINE W REFLEX MICROSCOPIC  BLOOD GAS, ARTERIAL  TROPONIN I (HIGH SENSITIVITY)  TROPONIN I (HIGH SENSITIVITY)    EKG None  Radiology DG Chest Portable 1 View  Result Date: 03/26/2020 CLINICAL DATA:  Hypoxia, altered mental status. EXAM: PORTABLE CHEST 1 VIEW COMPARISON:  None. FINDINGS: Bulging contour of the ascending aorta, concerning for aneurysm. Aortic atherosclerosis. Mild enlargement the  cardiac silhouette. Postsurgical changes of CABG with median sternotomy. Linear right basilar opacities. No confluent consolidation. No visible pleural effusions or pneumothorax. No acute osseous abnormality. IMPRESSION: 1. Findings concerning for aneurysm of the ascending aorta. Recommend CTA chest to further evaluate. 2. Linear right basilar opacities, favor atelectasis. Findings discussed with Dr. Julieanne Manson Via telephone at 2:44 PM. Electronically Signed   By: Feliberto Harts MD   On: 03/26/2020 14:47    Procedures Procedures (including critical care time)  CRITICAL CARE Performed by: Canary Brim Indea Dearman Total critical care time: 35 minutes Critical care time was exclusive of separately billable procedures and treating other patients. Critical care was necessary to treat or prevent imminent or life-threatening deterioration. Critical care was time spent personally by me on the following activities: development of treatment plan with patient and/or surrogate as well as nursing, discussions with consultants, evaluation of patient's response to treatment, examination of patient, obtaining history from patient or surrogate, ordering and performing treatments and interventions, ordering and review of laboratory studies, ordering and review of radiographic studies, pulse oximetry and re-evaluation of patient's condition.  Medications Ordered in ED Medications - No data to display  ED Course  I have reviewed the triage vital signs and the nursing notes.  Pertinent labs & imaging results that were available during my care of the patient were reviewed by me and considered in my medical decision making (see chart for details).  Clinical Course as of Mar 26 1601  Thu Mar 26, 2020  1556 CMP with mildly elevated LFTs, Lipase and Trop normal.    [CS]    Clinical Course User Index [CS] Pollyann Savoy, MD  MDM Rules/Calculators/A&P                          Jasmine Carrillo is a 79 y.o. female  with a past medical history significant for dementia (reportedly nonverbal at baseline), DNR/DNI with a MOST form, hypertension, anxiety, depression, hyperlipidemia, and prior CAD status post CABG who presents for altered mental status and hypoxia.  According to EMS, was normal at 11:30 AM eating lunch.  She then was found around 1 PM with decreased responsiveness and intermittent hypoxia.  According EMS, patient's baseline is that she does not speak but follows commands and follows people and they speak with her.  She is not doing this on arrival.  Patient did have a cough on arrival but otherwise she was minimally responding to painful stimuli.  She did not have any abdominal tenderness or chest tenderness on my exam but had significant rhonchi in all lung fields.  Her oxygen saturations were in the 70s when I first assessed the patient on room air on arrival.  Patient had no evidence of acute trauma and did not have a fall reported.  On exam, patient had rhonchi.  Chest and abdomen appear to be tender.  Good pulses in extremities.  Patient difficult story given her nonverbal baseline.  Clinically I am concerned about a pneumonia given the rhonchorous breathing and the hypoxia.  She initially was not tachycardic or febrile.  Patient will get chest x-ray and labs and CT head given the altered mental status.  CT head has not yet been completed but chest x-ray shows evidence of some opacities as well as a wide mediastinum.  I spoke with radiology personally and they recommend CTA to make sure there is no dissection or aneurysm.  This will be ordered.  She will still have the head CT.  After return from x-ray, she is now febrile and tachycardic.  Given the breath sounds, hypoxia, and x-ray findings, we will treat with antibiotics for pneumonia.  Given her new oxygen requirement, anticipate admission after work-up is completed with the CT imaging.  Care transferred to Dr. Bernette Mayers while awaiting for results of  CT imaging.  Anticipate admission.   Final Clinical Impression(s) / ED Diagnoses Final diagnoses:  Altered mental status, unspecified altered mental status type  Hypoxia  Community acquired pneumonia, unspecified laterality   Clinical Impression: 1. Altered mental status, unspecified altered mental status type   2. Hypoxia   3. Community acquired pneumonia, unspecified laterality     Disposition:  Care transferred to Dr. Bernette Mayers while awaiting for results of CT imaging.  Anticipate admission.  This note was prepared with assistance of Conservation officer, historic buildings. Occasional wrong-word or sound-a-like substitutions may have occurred due to the inherent limitations of voice recognition software.     Shandrea Lusk, Canary Brim, MD 03/26/20 [phone]

## 2020-03-26 NOTE — ED Notes (Signed)
Patient transported to CT 

## 2020-03-26 NOTE — ED Notes (Signed)
Pt noted to have stool in brief, pt cleaned, new purewick placed

## 2020-03-26 NOTE — ED Notes (Signed)
This RN called CT to inquire about head CT not getting done while doing chest CT, CT tech did not know why it was not done but pt cannot get head CT done for another hour due to contrast from previous CT

## 2020-03-27 ENCOUNTER — Observation Stay (HOSPITAL_BASED_OUTPATIENT_CLINIC_OR_DEPARTMENT_OTHER): Payer: Medicare Other

## 2020-03-27 ENCOUNTER — Observation Stay (HOSPITAL_COMMUNITY): Payer: Medicare Other

## 2020-03-27 DIAGNOSIS — Z515 Encounter for palliative care: Secondary | ICD-10-CM | POA: Diagnosis not present

## 2020-03-27 DIAGNOSIS — J189 Pneumonia, unspecified organism: Secondary | ICD-10-CM | POA: Diagnosis present

## 2020-03-27 DIAGNOSIS — G301 Alzheimer's disease with late onset: Secondary | ICD-10-CM | POA: Diagnosis not present

## 2020-03-27 DIAGNOSIS — K5909 Other constipation: Secondary | ICD-10-CM | POA: Diagnosis present

## 2020-03-27 DIAGNOSIS — E860 Dehydration: Secondary | ICD-10-CM | POA: Diagnosis present

## 2020-03-27 DIAGNOSIS — R4182 Altered mental status, unspecified: Secondary | ICD-10-CM | POA: Diagnosis not present

## 2020-03-27 DIAGNOSIS — I6389 Other cerebral infarction: Secondary | ICD-10-CM | POA: Diagnosis not present

## 2020-03-27 DIAGNOSIS — J9811 Atelectasis: Secondary | ICD-10-CM | POA: Diagnosis present

## 2020-03-27 DIAGNOSIS — J9601 Acute respiratory failure with hypoxia: Secondary | ICD-10-CM | POA: Diagnosis present

## 2020-03-27 DIAGNOSIS — R6520 Severe sepsis without septic shock: Secondary | ICD-10-CM | POA: Diagnosis present

## 2020-03-27 DIAGNOSIS — R296 Repeated falls: Secondary | ICD-10-CM | POA: Diagnosis present

## 2020-03-27 DIAGNOSIS — I361 Nonrheumatic tricuspid (valve) insufficiency: Secondary | ICD-10-CM | POA: Diagnosis not present

## 2020-03-27 DIAGNOSIS — R6510 Systemic inflammatory response syndrome (SIRS) of non-infectious origin without acute organ dysfunction: Secondary | ICD-10-CM | POA: Diagnosis not present

## 2020-03-27 DIAGNOSIS — I2510 Atherosclerotic heart disease of native coronary artery without angina pectoris: Secondary | ICD-10-CM | POA: Diagnosis present

## 2020-03-27 DIAGNOSIS — R7989 Other specified abnormal findings of blood chemistry: Secondary | ICD-10-CM | POA: Diagnosis not present

## 2020-03-27 DIAGNOSIS — F32A Depression, unspecified: Secondary | ICD-10-CM | POA: Diagnosis present

## 2020-03-27 DIAGNOSIS — I10 Essential (primary) hypertension: Secondary | ICD-10-CM | POA: Diagnosis present

## 2020-03-27 DIAGNOSIS — M4856XA Collapsed vertebra, not elsewhere classified, lumbar region, initial encounter for fracture: Secondary | ICD-10-CM | POA: Diagnosis present

## 2020-03-27 DIAGNOSIS — M2550 Pain in unspecified joint: Secondary | ICD-10-CM | POA: Diagnosis not present

## 2020-03-27 DIAGNOSIS — K5900 Constipation, unspecified: Secondary | ICD-10-CM | POA: Diagnosis not present

## 2020-03-27 DIAGNOSIS — F0280 Dementia in other diseases classified elsewhere without behavioral disturbance: Secondary | ICD-10-CM | POA: Diagnosis present

## 2020-03-27 DIAGNOSIS — J439 Emphysema, unspecified: Secondary | ICD-10-CM | POA: Diagnosis present

## 2020-03-27 DIAGNOSIS — Z20822 Contact with and (suspected) exposure to covid-19: Secondary | ICD-10-CM | POA: Diagnosis present

## 2020-03-27 DIAGNOSIS — G9341 Metabolic encephalopathy: Secondary | ICD-10-CM | POA: Diagnosis present

## 2020-03-27 DIAGNOSIS — A4151 Sepsis due to Escherichia coli [E. coli]: Secondary | ICD-10-CM | POA: Diagnosis present

## 2020-03-27 DIAGNOSIS — G309 Alzheimer's disease, unspecified: Secondary | ICD-10-CM | POA: Diagnosis present

## 2020-03-27 DIAGNOSIS — N390 Urinary tract infection, site not specified: Secondary | ICD-10-CM | POA: Diagnosis present

## 2020-03-27 DIAGNOSIS — Z7401 Bed confinement status: Secondary | ICD-10-CM | POA: Diagnosis not present

## 2020-03-27 DIAGNOSIS — I712 Thoracic aortic aneurysm, without rupture: Secondary | ICD-10-CM | POA: Diagnosis present

## 2020-03-27 DIAGNOSIS — I959 Hypotension, unspecified: Secondary | ICD-10-CM | POA: Diagnosis present

## 2020-03-27 DIAGNOSIS — Z66 Do not resuscitate: Secondary | ICD-10-CM | POA: Diagnosis present

## 2020-03-27 DIAGNOSIS — R930 Abnormal findings on diagnostic imaging of skull and head, not elsewhere classified: Secondary | ICD-10-CM | POA: Diagnosis not present

## 2020-03-27 DIAGNOSIS — E785 Hyperlipidemia, unspecified: Secondary | ICD-10-CM | POA: Diagnosis present

## 2020-03-27 DIAGNOSIS — Z951 Presence of aortocoronary bypass graft: Secondary | ICD-10-CM | POA: Diagnosis not present

## 2020-03-27 DIAGNOSIS — I639 Cerebral infarction, unspecified: Secondary | ICD-10-CM | POA: Diagnosis present

## 2020-03-27 LAB — BLOOD CULTURE ID PANEL (REFLEXED) - BCID2

## 2020-03-27 LAB — CBC WITH DIFFERENTIAL/PLATELET
Abs Immature Granulocytes: 0.02 10*3/uL (ref 0.00–0.07)
Basophils Absolute: 0 10*3/uL (ref 0.0–0.1)
Basophils Relative: 0 %
Eosinophils Absolute: 0 10*3/uL (ref 0.0–0.5)
Eosinophils Relative: 0 %
HCT: 31.4 % — ABNORMAL LOW (ref 36.0–46.0)
Hemoglobin: 9.7 g/dL — ABNORMAL LOW (ref 12.0–15.0)
Immature Granulocytes: 0 %
Lymphocytes Relative: 8 %
Lymphs Abs: 0.4 10*3/uL — ABNORMAL LOW (ref 0.7–4.0)
MCH: 26.4 pg (ref 26.0–34.0)
MCHC: 30.9 g/dL (ref 30.0–36.0)
MCV: 85.3 fL (ref 80.0–100.0)
Monocytes Absolute: 0.4 10*3/uL (ref 0.1–1.0)
Monocytes Relative: 9 %
Neutro Abs: 4 10*3/uL (ref 1.7–7.7)
Neutrophils Relative %: 83 %
Platelets: 153 10*3/uL (ref 150–400)
RBC: 3.68 MIL/uL — ABNORMAL LOW (ref 3.87–5.11)
RDW: 16.5 % — ABNORMAL HIGH (ref 11.5–15.5)
WBC: 4.8 10*3/uL (ref 4.0–10.5)
nRBC: 0 % (ref 0.0–0.2)

## 2020-03-27 LAB — ECHOCARDIOGRAM COMPLETE
AR max vel: 1 cm2
AV Area VTI: 1.08 cm2
AV Area mean vel: 1.04 cm2
AV Mean grad: 21.5 mmHg
AV Peak grad: 41.9 mmHg
Ao pk vel: 3.24 m/s
Area-P 1/2: 2.43 cm2
Height: 62 in
S' Lateral: 2.8 cm
Weight: 1823.65 oz

## 2020-03-27 LAB — COMPREHENSIVE METABOLIC PANEL
ALT: 129 U/L — ABNORMAL HIGH (ref 0–44)
AST: 96 U/L — ABNORMAL HIGH (ref 15–41)
Albumin: 2.6 g/dL — ABNORMAL LOW (ref 3.5–5.0)
Alkaline Phosphatase: 137 U/L — ABNORMAL HIGH (ref 38–126)
Anion gap: 8 (ref 5–15)
BUN: 20 mg/dL (ref 8–23)
CO2: 24 mmol/L (ref 22–32)
Calcium: 8.3 mg/dL — ABNORMAL LOW (ref 8.9–10.3)
Chloride: 112 mmol/L — ABNORMAL HIGH (ref 98–111)
Creatinine, Ser: 0.47 mg/dL (ref 0.44–1.00)
GFR, Estimated: >60 mL/min (ref >60)
Glucose, Bld: 96 mg/dL (ref 70–99)
Potassium: 3.5 mmol/L (ref 3.5–5.1)
Sodium: 144 mmol/L (ref 135–145)
Total Bilirubin: 0.6 mg/dL (ref 0.3–1.2)
Total Protein: 5.5 g/dL — ABNORMAL LOW (ref 6.5–8.1)

## 2020-03-27 LAB — LIPID PANEL
Cholesterol: 107 mg/dL (ref 0–200)
HDL: 47 mg/dL (ref >40)
LDL Cholesterol: 50 mg/dL (ref 0–99)
Total CHOL/HDL Ratio: 2.3 RATIO
Triglycerides: 52 mg/dL (ref <150)
VLDL: 10 mg/dL (ref 0–40)

## 2020-03-27 LAB — HEPATITIS PANEL, ACUTE
HCV Ab: NONREACTIVE
Hep A IgM: NONREACTIVE
Hep B C IgM: NONREACTIVE
Hepatitis B Surface Ag: NONREACTIVE

## 2020-03-27 LAB — MAGNESIUM: Magnesium: 1.9 mg/dL (ref 1.7–2.4)

## 2020-03-27 LAB — FOLATE: Folate: 27.5 ng/mL (ref >5.9)

## 2020-03-27 LAB — HEMOGLOBIN A1C
Hgb A1c MFr Bld: 5.8 % — ABNORMAL HIGH (ref 4.8–5.6)
Mean Plasma Glucose: 120 mg/dL

## 2020-03-27 LAB — IRON AND TIBC
Iron: 12 ug/dL — ABNORMAL LOW (ref 28–170)
Saturation Ratios: 4 % — ABNORMAL LOW (ref 10.4–31.8)
TIBC: 333 ug/dL (ref 250–450)
UIBC: 321 ug/dL

## 2020-03-27 LAB — VITAMIN B12: Vitamin B-12: 1206 pg/mL — ABNORMAL HIGH (ref 180–914)

## 2020-03-27 LAB — FERRITIN: Ferritin: 65 ng/mL (ref 11–307)

## 2020-03-27 LAB — PHOSPHORUS: Phosphorus: 2.6 mg/dL (ref 2.5–4.6)

## 2020-03-27 LAB — PROCALCITONIN: Procalcitonin: 0.55 ng/mL

## 2020-03-27 LAB — MRSA PCR SCREENING: MRSA by PCR: NEGATIVE

## 2020-03-27 MED ORDER — ASPIRIN 81 MG PO CHEW
81.0000 mg | CHEWABLE_TABLET | Freq: Every day | ORAL | Status: DC
  Administered 2020-03-27 – 2020-03-29 (×3): 81 mg via ORAL
  Filled 2020-03-27 (×3): qty 1

## 2020-03-27 MED ORDER — ENSURE ENLIVE PO LIQD
237.0000 mL | Freq: Two times a day (BID) | ORAL | Status: DC
  Administered 2020-03-29: 237 mL via ORAL

## 2020-03-27 MED ORDER — MEMANTINE HCL 10 MG PO TABS: 5.0000 mg | ORAL_TABLET | Freq: Every day | ORAL | Status: DC

## 2020-03-27 MED ORDER — LACTULOSE ENEMA
300.0000 mL | Freq: Once | ORAL | Status: AC
  Administered 2020-03-27: 300 mL via RECTAL
  Filled 2020-03-27: qty 300

## 2020-03-27 MED ORDER — ESCITALOPRAM OXALATE 20 MG PO TABS
20.0000 mg | ORAL_TABLET | Freq: Every day | ORAL | Status: DC
  Administered 2020-03-27 – 2020-03-28 (×2): 20 mg via ORAL
  Filled 2020-03-27 (×2): qty 1

## 2020-03-27 MED ORDER — ADULT MULTIVITAMIN W/MINERALS CH
1.0000 | ORAL_TABLET | Freq: Every day | ORAL | Status: DC
  Administered 2020-03-27 – 2020-03-29 (×3): 1 via ORAL
  Filled 2020-03-27 (×2): qty 1

## 2020-03-27 MED ORDER — ACETAMINOPHEN 500 MG PO TABS
1000.0000 mg | ORAL_TABLET | Freq: Three times a day (TID) | ORAL | Status: DC
  Administered 2020-03-27 – 2020-03-30 (×8): 1000 mg via ORAL
  Filled 2020-03-27 (×9): qty 2

## 2020-03-27 NOTE — Progress Notes (Signed)
Nutrition Follow-up  DOCUMENTATION CODES:   Not applicable  INTERVENTION:   -MVI with minerals daily -Ensure Enlive po BID, each supplement provides 350 kcal and 20 grams of protein -Magic cup TID with meals, each supplement provides 290 kcal and 9 grams of protein -Feeding assistance with meals  NUTRITION DIAGNOSIS:   Inadequate oral intake related to inability to eat as evidenced by NPO status.  GOAL:   Patient will meet greater than or equal to 90% of their needs  MONITOR:   PO intake, Supplement acceptance, Diet advancement, Labs, Weight trends, Skin, I & O's  REASON FOR ASSESSMENT:   Consult Assessment of nutrition requirement/status  ASSESSMENT:   79 y.o. female with medical history significant of dementia, HTN, HLDCAD, CABG in 2008, chronic constipation, depression recurrent falls, seizure-like activities, myoclonus Admitted for SIRS, acute encephalopathy  Pt admitted with SIRS, acute encephalopathy, and possible CVA.  Reviewed I/O's: +1 L x 24 hours  UOP: 100 ml x 24 hours  Pt out of room at time of visit. No family at bedside to obtain further history.   Reviewed wt hx; pt with remote hx of wt loss. Pt is at high risk for malnutrition, however, unable to identify at this time.   ADDENDUM (1612): Pt s/p BSE and advanced to a regular diet.   Medications reviewed and include lactulose.   Labs reviewed.   Diet Order:   Diet Order            Diet regular Room service appropriate? No; Fluid consistency: Thin  Diet effective now                 EDUCATION NEEDS:   No education needs have been identified at this time  Skin:  Skin Assessment: Reviewed RN Assessment  Last BM:  03/27/20  Height:   Ht Readings from Last 1 Encounters:  03/26/20 5\' 2"  (1.575 m)    Weight:   Wt Readings from Last 1 Encounters:  03/26/20 51.7 kg    Ideal Body Weight:  50 kg  BMI:  Body mass index is 20.85 kg/m.  Estimated Nutritional Needs:   Kcal:   1550-1750  Protein:  85-100 grams  Fluid:  > 1.5 L    03/28/20, RD, LDN, CDCES Registered Dietitian II Certified Diabetes Care and Education Specialist Please refer to [facility] for RD and/or RD on-call/weekend/after hours pager

## 2020-03-27 NOTE — NC FL2 (Signed)
[place] MEDICAID FL2 LEVEL OF CARE SCREENING TOOL     IDENTIFICATION  Patient Name: Jasmine Carrillo Birthdate: 12-Nov-1940 Sex: female Admission Date (Current Location): 03/26/2020  [facility] and [place] Number:  Producer, television/film/video and Address:  [facility], [address], [place], [place] [address]      Provider Number: [id number]  Attending Physician Name and Address:  Ollen Bowl, MD  Relative Name and Phone Number:  Janylah, [place] (Daughter) [phone] [facility])    Current Level of Care: Hospital Recommended Level of Care: Skilled Nursing Facility Prior Approval Number:    Date Approved/Denied:   PASRR Number: [id number]  Discharge Plan: SNF    Current Diagnoses: Patient Active Problem List   Diagnosis Date Noted  . SIRS (systemic inflammatory response syndrome) (HCC) 03/26/2020  . Ascending aortic aneurysm (HCC) 03/26/2020  . Acute lower UTI 03/26/2020  . Obstipation 03/26/2020  . Acute metabolic encephalopathy 03/26/2020  . Acute respiratory failure with hypoxia (HCC) 03/26/2020  . Elevated LFTs 03/26/2020  . Abnormal head CT 03/26/2020  . Essential hypertension 02/07/2019  . Hyperlipidemia 02/07/2019  . Dementia (HCC) 02/07/2019  . Closed right ankle fracture 02/07/2019  . Other fracture of left great toe, initial encounter for closed fracture 02/07/2019  . Recurrent falls 02/01/2019    Orientation RESPIRATION BLADDER Height & Weight      (disoriented x4)  O2 ([place] 2L) External catheter Weight: 113 lb 15.7 oz (51.7 kg) Height:  5\' 2"  (157.5 cm)  BEHAVIORAL SYMPTOMS/MOOD NEUROLOGICAL BOWEL NUTRITION STATUS   (none)   Incontinent Diet (see d/c summary)  AMBULATORY STATUS COMMUNICATION OF NEEDS Skin   Extensive Assist Verbally Normal                       Personal Care Assistance Level of Assistance  Bathing, Feeding, Dressing Bathing Assistance: Maximum assistance Feeding assistance: Limited assistance Dressing  Assistance: Maximum assistance     Functional Limitations Info  Sight, Hearing, Speech Sight Info: Adequate Hearing Info: Adequate Speech Info: Adequate    SPECIAL CARE FACTORS FREQUENCY                       Contractures Contractures Info: Not present    Additional Factors Info  Code Status Code Status Info: DNR             Current Medications (03/27/2020):  This is the current hospital active medication list Current Facility-Administered Medications  Medication Dose Route Frequency Provider Last Rate Last Admin  .  stroke: mapping our early stages of recovery book   Does not apply Once 03/29/2020, MD      . acetaminophen (TYLENOL) tablet 650 mg  650 mg Oral Q6H PRN Therisa Doyne, MD       Or  . acetaminophen (TYLENOL) suppository 650 mg  650 mg Rectal Q6H PRN Doutova, Anastassia, MD      . albuterol (VENTOLIN HFA) 108 (90 Base) MCG/ACT inhaler 2 puff  2 puff Inhalation Daily Doutova, Anastassia, MD      . bisacodyl (DULCOLAX) suppository 10 mg  10 mg Rectal Daily PRN Doutova, Anastassia, MD      . cefTRIAXone (ROCEPHIN) 2 g in sodium chloride 0.9 % 100 mL IVPB  2 g Intravenous Q24H Therisa Doyne, MD   Stopped at 03/26/20 1807  . divalproex (DEPAKOTE SPRINKLE) capsule 125 mg  125 mg Oral Q12H 03/28/20, MD  Discharge Medications: Please see discharge summary for a list of discharge medications.  Relevant Imaging Results:  Relevant Lab Results:   Additional Information SS# [id number] [place], [place]

## 2020-03-27 NOTE — Progress Notes (Signed)
PHARMACY - PHYSICIAN COMMUNICATION CRITICAL VALUE ALERT - BLOOD CULTURE IDENTIFICATION (BCID)  Jasmine Carrillo is an 79 y.o. female who presented to [facility] on 03/26/2020 with a chief complaint of AMS  Assessment:  Pt growing Ecoli (no resistance) in 4/4 blood culture bottles (likely source urinary vs intra-abd)  Name of physician (or Provider) Contacted: Dr. Rachael Darby  Current antibiotics: Rocephin, Azithromycin, and Flagyl  Changes to prescribed antibiotics recommended:  Continue Rocephin D/c Azithromycin and Flagyl  Results for orders placed or performed during the hospital encounter of 03/26/20  Blood Culture ID Panel (Reflexed) (Collected: 03/26/2020  2:46 PM)  Result Value Ref Range   Enterococcus faecalis NOT DETECTED NOT DETECTED   Enterococcus Faecium NOT DETECTED NOT DETECTED   Listeria monocytogenes NOT DETECTED NOT DETECTED   Staphylococcus species NOT DETECTED NOT DETECTED   Staphylococcus aureus (BCID) NOT DETECTED NOT DETECTED   Staphylococcus epidermidis NOT DETECTED NOT DETECTED   Staphylococcus lugdunensis NOT DETECTED NOT DETECTED   Streptococcus species NOT DETECTED NOT DETECTED   Streptococcus agalactiae NOT DETECTED NOT DETECTED   Streptococcus pneumoniae NOT DETECTED NOT DETECTED   Streptococcus pyogenes NOT DETECTED NOT DETECTED   A.calcoaceticus-baumannii NOT DETECTED NOT DETECTED   Bacteroides fragilis NOT DETECTED NOT DETECTED   Enterobacterales DETECTED (A) NOT DETECTED   Enterobacter cloacae complex NOT DETECTED NOT DETECTED   Escherichia coli DETECTED (A) NOT DETECTED   Klebsiella aerogenes NOT DETECTED NOT DETECTED   Klebsiella oxytoca NOT DETECTED NOT DETECTED   Klebsiella pneumoniae NOT DETECTED NOT DETECTED   Proteus species NOT DETECTED NOT DETECTED   Salmonella species NOT DETECTED NOT DETECTED   Serratia marcescens NOT DETECTED NOT DETECTED   Haemophilus influenzae NOT DETECTED NOT DETECTED   Neisseria meningitidis NOT DETECTED NOT  DETECTED   Pseudomonas aeruginosa NOT DETECTED NOT DETECTED   Stenotrophomonas maltophilia NOT DETECTED NOT DETECTED   Candida albicans NOT DETECTED NOT DETECTED   Candida auris NOT DETECTED NOT DETECTED   Candida glabrata NOT DETECTED NOT DETECTED   Candida krusei NOT DETECTED NOT DETECTED   Candida parapsilosis NOT DETECTED NOT DETECTED   Candida tropicalis NOT DETECTED NOT DETECTED   Cryptococcus neoformans/gattii NOT DETECTED NOT DETECTED   CTX-M ESBL NOT DETECTED NOT DETECTED   Carbapenem resistance IMP NOT DETECTED NOT DETECTED   Carbapenem resistance KPC NOT DETECTED NOT DETECTED   Carbapenem resistance NDM NOT DETECTED NOT DETECTED   Carbapenem resist OXA 48 LIKE NOT DETECTED NOT DETECTED   Carbapenem resistance VIM NOT DETECTED NOT DETECTED    Jasmine Carrillo 03/27/2020  6:26 AM

## 2020-03-27 NOTE — Progress Notes (Signed)
Jasmine Carrillo is a 79 y.o. female patient admitted. Awake, unable to communicate. VSS - Blood pressure 106/69, pulse 89, temperature 98.7 F (37.1 C), temperature source Oral, resp. rate 18, height 5\' 2"  (1.575 m), weight 51.7 kg, SpO2 100 %.    IV in place, occlusive dsg intact without redness.   Will cont to eval and treat per MD orders.  , RN 03/27/2020 12:29 AM

## 2020-03-27 NOTE — Evaluation (Signed)
Physical Therapy Evaluation & Discharge Patient Details Name: Jasmine Carrillo MRN: [id number] DOB: January 21, 1941 Today's Date: 03/27/2020   History of Present Illness  Pt is a 79 y.o. female admitted from [facility] on 03/26/20 with AMS. Workup for SIRS unclear source, LFT elevation possible early UTI vs. cholecystitis. PMH includes dementia, Parkinson's disease, HTN, CAD s/p CABG (2008), myoclonus, depression.    Clinical Impression  Patient evaluated by Physical Therapy with no further acute PT needs identified. PTA, pt resident at [facility], bed/wheelchair bound with assist for all ADL tasks. Today, pt requiring maxA+2 to totalA for all mobility, able to tolerate sitting EOB a few minutes with maxA. Pt minimally verbal with brief periods of eye contact; attempted to facilitate automatic response with music. At this time, recommend return to long term care at [facility]; post-acute rehab services not warranted. Acute PT is signing off. Thank you for this referral.  SpO2 94% on RA, HR 73    Follow Up Recommendations Other (return to long term care at [facility])    Equipment Recommendations  None recommended by PT    Recommendations for Other Services       Precautions / Restrictions Precautions Precautions: Fall;Other (comment) Precaution Comments: H/o Parkinson's disease, dementia Restrictions Weight Bearing Restrictions: No      Mobility  Bed Mobility Overal bed mobility: Needs Assistance Bed Mobility: Supine to Sit;Sit to Supine     Supine to sit: Total assist;+2 for physical assistance;+2 for safety/equipment Sit to supine: Total assist;+2 for physical assistance;+2 for safety/equipment   General bed mobility comments: total A helicopter style to sit EOB with max truncal support    Transfers                    Ambulation/Gait                Stairs            Wheelchair Mobility    Modified Rankin (Stroke Patients Only)        Balance Overall balance assessment: Needs assistance Sitting-balance support: Feet supported Sitting balance-Leahy Scale: Zero Sitting balance - Comments: MaxA for initial trunk support, temporarily progressing to modA with HHA, fading to max-totalA with fatigue; able to translate weight anteriorly with BUE assist                                     Pertinent Vitals/Pain Pain Assessment: Faces Faces Pain Scale: Hurts a little bit Pain Location: generalized with movement Pain Descriptors / Indicators: Grimacing Pain Intervention(s): Monitored during session;Limited activity within patient's tolerance;Repositioned    Home Living Family/patient expects to be discharged to:: Skilled nursing facility                 Additional Comments: Long term resident at [facility]    Prior Function Level of Independence: Needs assistance   Gait / Transfers Assistance Needed: assume pt spends most of time in bed or w/c due to BLE tightness/contracture at heels  ADL's / Homemaking Assistance Needed: dependent        Hand Dominance        Extremity/Trunk Assessment   Upper Extremity Assessment Upper Extremity Assessment: Difficult to assess due to impaired cognition;RUE deficits/detail;LUE deficits/detail RUE Deficits / Details: noted flexor contractures, strong grip strength and elbow flexor strength; myoclonus noted throughout; shoulder  <3/5 LUE Deficits / Details: noted flexor contractures, strong  grip strength and elbow flexor strength; myoclonus noted throughout; shoulder  <3/5    Lower Extremity Assessment Lower Extremity Assessment: RLE deficits/detail;LLE deficits/detail;Difficult to assess due to impaired cognition RLE Deficits / Details: Ankle PF contracture, pt grimacing with attempted at heel cord/calf stretch; knee/hip flexor contractures; myoclonus/tremors with movement; functionally <3/5 strength throughout LLE Deficits / Details: Ankle PF  contracture, pt grimacing with attempted at heel cord/calf stretch; knee/hip flexor contractures; myoclonus/tremors with movement; functionally <3/5 strength throughout LLE Coordination: decreased gross motor;decreased fine motor       Communication   Communication: Receptive difficulties;Expressive difficulties  Cognition Arousal/Alertness: Awake/alert Behavior During Therapy: Flat affect Overall Cognitive Status: History of cognitive impairments - at baseline                                 General Comments: history of dementia and parkinsons at baseline. Minimally verbal other than occasionally answering "yes". Emotionally reactive to some music played in session      General Comments General comments (skin integrity, edema, etc.): At end of session, positioned to encourage R-side sacral offloading and heels floated on pillow    Exercises Other Exercises Other Exercises: PROM heel cord stretch, knee extension stretch   Assessment/Plan    PT Assessment Patent does not need any further PT services  PT Problem List         PT Treatment Interventions      PT Goals (Current goals can be found in the Care Plan section)  Acute Rehab PT Goals PT Goal Formulation: Patient unable to participate in goal setting    Frequency     Barriers to discharge        Co-evaluation               AM-PAC PT "6 Clicks" Mobility  Outcome Measure Help needed turning from your back to your side while in a flat bed without using bedrails?: Total Help needed moving from lying on your back to sitting on the side of a flat bed without using bedrails?: Total Help needed moving to and from a bed to a chair (including a wheelchair)?: Total Help needed standing up from a chair using your arms (e.g., wheelchair or bedside chair)?: Total Help needed to walk in hospital room?: Total Help needed climbing 3-5 steps with a railing? : Total 6 Click Score: 6    End of Session    Activity Tolerance: Other (comment) (limited participation secondary to impaired cognition) Patient left: in bed;with call bell/phone within reach;with bed alarm set Nurse Communication: Mobility status PT Visit Diagnosis: Other abnormalities of gait and mobility (R26.89)    Time: 5852-7782 PT Time Calculation (min) (ACUTE ONLY): 27 min   Charges:   PT Evaluation $PT Eval Moderate Complexity: 1 Mod     Ina Homes, PT, DPT Acute Rehabilitation Services  Pager [phone] Office [phone]  Malachy Chamber 03/27/2020, 2:47 PM

## 2020-03-27 NOTE — Progress Notes (Signed)
SLP Cancellation Note  Patient Details Name: Shailee Foots MRN: [id number] DOB: April 27, 1941   Cancelled treatment:       Reason Eval/Treat Not Completed: Other (comment). Order received for cognitive linguistic eval as part of the stroke order set. Pt with advanced dementia with plans to not escalate care. Will defer cognitive linguistic eval. Note that pt is NPO. Comfort feeding may be plan of care, but if swallow eval is needed, please order   Rehanna Oloughlin, Riley Nearing 03/27/2020, 8:02 AM

## 2020-03-27 NOTE — Progress Notes (Signed)
PROGRESS NOTE    Jasmine Carrillo  ZOX:[id number] DOB: [birth year] DOA: 03/26/2020 PCP: Laurann MontanaWhite, Cynthia, MD   Brief Narrative:  Patient is a 79 year old female with past medical history of dementia, hypertension, hyperlipidemia, coronary artery disease status post CABG, chronic constipation, depression, recurrent falls presented with altered mental status.  ED course: Patient was noted to be tachycardic and febrile or fever of 100.9, CMP shows elevated LFTs, chest x-ray shows widened mediastinum.  CTA chest showed 6.3 cm ascending aortic aneurysm.  UA showed bacteria, imaging showed obstipation with some gallbladder wall abnormality.  CT scan showed no evidence of cholecystitis.  She was also placed on 3 L of oxygen due to hypoxia.  Admitted for acute metabolic encephalopathy.    Assessment & Plan:  Acute metabolic encephalopathy: -In the setting of severe Alzheimer's dementia, dehydration and?  UTI -Presented with fever, tachycardia, tachypnea, hypoxia requiring 3 L of oxygen via nasal cannula, chest x-ray negative for infection, COVID-19 negative, UA positive for bacteria. -Reviewed CT head, CTA chest, right upper quadrant ultrasound. -Continue IV fluids and IV antibiotics -No leukocytosis, lactic acid, ammonia, TSH, lipase, troponin: WNL, blood culture and urine culture obtained and is pending. -Consult PT/OT/SLP -On fall/aspiration precautions  Acute hypoxemic respiratory failure: Patient is requiring 3 L of oxygen via nasal cannula.  Chest x-ray and CT chest negative for infection.Marland Kitchen.  COVID-19 negative. -We will try to wean off of oxygen as tolerated.  Abnormal CT head: -Suspicious for acute infarct. -Patient's family refused further aggressive intervention at this time. -Continue aspirin.  Hold statin due to elevated liver enzymes. -Consult PT/OT/SLP  Elevated liver enzymes: -Ammonia: WNL.  Right upper quadrant ultrasound shows no cholecystitis. -Hepatitis panel: Negative -Monitor  liver enzymes.  Coronary artery disease status post CABG in 2008 -Continue aspirin, hold statin due to elevated liver enzymes.  Hypertension: Blood pressure soft upon admission. -Hold Coreg for now. -Continue with gentle hydration.  Resume Coreg once blood pressure is back to baseline.  Hyperlipidemia: -Reviewed lipid panel.  Hold statin due to elevated liver enzymes.  CK level: WNL  Hypomagnesemia: Replenished.  Repeat magnesium: WNL  Constipation: X-ray abdomen showed increased stool in rectum and right colon -Ordered rectal enema x1  Ascending aortic aneurysm: Noted on CTA chest. -Family does not want further intervention.  Low hemoglobin: H&H dropped from 13.3/39.0-9.7/31.4 in 1 day.  Likely dilutional.  No signs of active bleeding.  Continue to monitor.  Transfuse as needed. -Check B12, folate, iron panel  Dementia/depression: Continue home meds   DVT prophylaxis: Lovenox/SCD Code Status: DNR-confirmed with patient's daughter Family Communication: None present at bedside.  Plan of care discussed with patient in length and he verbalized understanding and agreed with it.  I called patient's daughter Ms. [name] and discussed the goals of care with her in details.  She does not want aggressive intervention.  Okay with IV fluids and IV antibiotics.  No further intervention for aortic aneurysm or stroke at this time.    Disposition Plan: SNF  Consultants:   None  Procedures:  CT head CT chest  Antimicrobials:   Rocephin  Status is: Observation  Dispo: The patient is from: SNF              Anticipated d/c is to: SNF              Anticipated d/c date is: 03/29/2020              Patient currently not a stable for the discharge.  Subjective: Patient seen and examined.  Appears confused and dehydrated and weak.  Alert but not following commands.  Objective: Vitals:   03/27/20 0007 03/27/20 0210 03/27/20 0505 03/27/20 1405  BP: 106/69 (!) 112/92 109/65  129/74  Pulse: 89 71 79 73  Resp: 18 16 17 18   Temp: 98.7 F (37.1 C) 98.4 F (36.9 C) (!) 97.5 F (36.4 C) 98.4 F (36.9 C)  TempSrc: Oral Oral Oral Oral  SpO2: 100% 97% 100% 98%  Weight:      Height:        Intake/Output Summary (Last 24 hours) at 03/27/2020 1445 Last data filed at 03/27/2020 1610 Gross per 24 hour  Intake 1112.67 ml  Output 100 ml  Net 1012.67 ml   Filed Weights   03/26/20 1434 03/26/20 2116  Weight: 61.7 kg 51.7 kg    Examination:  General exam: Appears calm and comfortable, on 3 L oxygen via nasal cannula, appears weak, dehydrated and confused. Respiratory system: Clear to auscultation. Respiratory effort normal. Cardiovascular system: S1 & S2 heard, RRR. No JVD, murmurs, rubs, gallops or clicks. No pedal edema. Gastrointestinal system: Abdomen is nondistended, soft and nontender. No organomegaly or masses felt. Normal bowel sounds heard. Central nervous system: Alert but not following commands.   Skin: No rashes, lesions or ulcers   Data Reviewed: I have personally reviewed following labs and imaging studies  CBC: Recent Labs  Lab 03/26/20 1416 03/26/20 1517 03/27/20 0413  WBC 10.2  --  4.8  NEUTROABS 9.4*  --  4.0  HGB 12.6 13.3 9.7*  HCT 40.9 39.0 31.4*  MCV 87.4  --  85.3  PLT 195  --  153   Basic Metabolic Panel: Recent Labs  Lab 03/26/20 1416 03/26/20 1517 03/26/20 2246 03/27/20 0413  NA 143 144  --  144  K 4.2 4.1  --  3.5  CL 106  --   --  112*  CO2 26  --   --  24  GLUCOSE 117*  --   --  96  BUN 25*  --   --  20  CREATININE 0.66  --   --  0.47  CALCIUM 9.2  --   --  8.3*  MG  --   --  1.3* 1.9  PHOS  --   --   --  2.6   GFR: Estimated Creatinine Clearance: 45.1 mL/min (by C-G formula based on SCr of 0.47 mg/dL). Liver Function Tests: Recent Labs  Lab 03/26/20 1416 03/27/20 0413  AST 185* 96*  ALT 184* 129*  ALKPHOS 192* 137*  BILITOT 0.8 0.6  PROT 6.9 5.5*  ALBUMIN 3.4* 2.6*   Recent Labs  Lab  03/26/20 1416  LIPASE 44   Recent Labs  Lab 03/26/20 1417  AMMONIA 11   Coagulation Profile: No results for input(s): INR, PROTIME in the last 168 hours. Cardiac Enzymes: Recent Labs  Lab 03/26/20 2246  CKTOTAL 176   BNP (last 3 results) No results for input(s): PROBNP in the last 8760 hours. HbA1C: No results for input(s): HGBA1C in the last 72 hours. CBG: No results for input(s): GLUCAP in the last 168 hours. Lipid Profile: Recent Labs    03/27/20 0413  CHOL 107  HDL 47  LDLCALC 50  TRIG 52  CHOLHDL 2.3   Thyroid Function Tests: Recent Labs    03/26/20 1417  TSH 0.862   Anemia Panel: No results for input(s): VITAMINB12, FOLATE, FERRITIN, TIBC, IRON, RETICCTPCT in the last 72 hours. Sepsis  Labs: Recent Labs  Lab 03/26/20 1416 03/26/20 1816 03/26/20 2246  PROCALCITON  --   --  0.55  LATICACIDVEN 1.3 1.2  --     Recent Results (from the past 240 hour(s))  Respiratory Panel by RT PCR (Flu A&B, Covid) - Nasopharyngeal Swab     Status: None   Collection Time: 03/26/20  2:45 PM   Specimen: Nasopharyngeal Swab  Result Value Ref Range Status   SARS Coronavirus 2 by RT PCR NEGATIVE NEGATIVE Final    Comment: (NOTE) SARS-CoV-2 target nucleic acids are NOT DETECTED.  The SARS-CoV-2 RNA is generally detectable in upper respiratoy specimens during the acute phase of infection. The lowest concentration of SARS-CoV-2 viral copies this assay can detect is 131 copies/mL. A negative result does not preclude SARS-Cov-2 infection and should not be used as the sole basis for treatment or other patient management decisions. A negative result may occur with  improper specimen collection/handling, submission of specimen other than nasopharyngeal swab, presence of viral mutation(s) within the areas targeted by this assay, and inadequate number of viral copies (<131 copies/mL). A negative result must be combined with clinical observations, patient history, and  epidemiological information. The expected result is Negative.  Fact Sheet for Patients:  https://www.moore.com/  Fact Sheet for Healthcare Providers:  https://www.young.biz/  This test is no t yet approved or cleared by the [place] FDA and  has been authorized for detection and/or diagnosis of SARS-CoV-2 by FDA under an Emergency Use Authorization (EUA). This EUA will remain  in effect (meaning this test can be used) for the duration of the COVID-19 declaration under Section 564(b)(1) of the Act, 21 U.S.C. section 360bbb-3(b)(1), unless the authorization is terminated or revoked sooner.     Influenza A by PCR NEGATIVE NEGATIVE Final   Influenza B by PCR NEGATIVE NEGATIVE Final    Comment: (NOTE) The Xpert Xpress SARS-CoV-2/FLU/RSV assay is intended as an aid in  the diagnosis of influenza from Nasopharyngeal swab specimens and  should not be used as a sole basis for treatment. Nasal washings and  aspirates are unacceptable for Xpert Xpress SARS-CoV-2/FLU/RSV  testing.  Fact Sheet for Patients: https://www.moore.com/  Fact Sheet for Healthcare Providers: https://www.young.biz/  This test is not yet approved or cleared by the [place] FDA and  has been authorized for detection and/or diagnosis of SARS-CoV-2 by  FDA under an Emergency Use Authorization (EUA). This EUA will remain  in effect (meaning this test can be used) for the duration of the  Covid-19 declaration under Section 564(b)(1) of the Act, 21  U.S.C. section 360bbb-3(b)(1), unless the authorization is  terminated or revoked. Performed at [facility], [address] [place], [place] [address]   Blood culture (routine x 2)     Status: None (Preliminary result)   Collection Time: 03/26/20  2:46 PM   Specimen: BLOOD  Result Value Ref Range Status   Specimen Description BLOOD LEFT ANTECUBITAL  Final   Special Requests    Final    BOTTLES DRAWN AEROBIC AND ANAEROBIC Blood Culture adequate volume   Culture  Setup Time   Final    IN BOTH AEROBIC AND ANAEROBIC BOTTLES GRAM NEGATIVE RODS Organism ID to follow CRITICAL RESULT CALLED TO, READ BACK BY AND VERIFIED WITH: K AMEND [facility] 03/27/20 0557 JDW Performed at [facility], [address] [place], [place] [address]    Culture GRAM NEGATIVE RODS  Final   Report Status PENDING  Incomplete  Blood Culture  ID Panel (Reflexed)     Status: Abnormal   Collection Time: 03/26/20  2:46 PM  Result Value Ref Range Status   Enterococcus faecalis NOT DETECTED NOT DETECTED Final   Enterococcus Faecium NOT DETECTED NOT DETECTED Final   Listeria monocytogenes NOT DETECTED NOT DETECTED Final   Staphylococcus species NOT DETECTED NOT DETECTED Final   Staphylococcus aureus (BCID) NOT DETECTED NOT DETECTED Final   Staphylococcus epidermidis NOT DETECTED NOT DETECTED Final   Staphylococcus lugdunensis NOT DETECTED NOT DETECTED Final   Streptococcus species NOT DETECTED NOT DETECTED Final   Streptococcus agalactiae NOT DETECTED NOT DETECTED Final   Streptococcus pneumoniae NOT DETECTED NOT DETECTED Final   Streptococcus pyogenes NOT DETECTED NOT DETECTED Final   A.calcoaceticus-baumannii NOT DETECTED NOT DETECTED Final   Bacteroides fragilis NOT DETECTED NOT DETECTED Final   Enterobacterales DETECTED (A) NOT DETECTED Final    Comment: Enterobacterales represent a large order of gram negative bacteria, not a single organism. CRITICAL RESULT CALLED TO, READ BACK BY AND VERIFIED WITH: K AMEND PHARMD 03/27/20 0557 JDW    Enterobacter cloacae complex NOT DETECTED NOT DETECTED Final   Escherichia coli DETECTED (A) NOT DETECTED Final    Comment: CRITICAL RESULT CALLED TO, READ BACK BY AND VERIFIED WITH: K AMEND PHARMD 03/27/20 0557 JDW    Klebsiella aerogenes NOT DETECTED NOT DETECTED Final   Klebsiella oxytoca NOT DETECTED NOT DETECTED Final   Klebsiella pneumoniae NOT  DETECTED NOT DETECTED Final   Proteus species NOT DETECTED NOT DETECTED Final   Salmonella species NOT DETECTED NOT DETECTED Final   Serratia marcescens NOT DETECTED NOT DETECTED Final   Haemophilus influenzae NOT DETECTED NOT DETECTED Final   Neisseria meningitidis NOT DETECTED NOT DETECTED Final   Pseudomonas aeruginosa NOT DETECTED NOT DETECTED Final   Stenotrophomonas maltophilia NOT DETECTED NOT DETECTED Final   Candida albicans NOT DETECTED NOT DETECTED Final   Candida auris NOT DETECTED NOT DETECTED Final   Candida glabrata NOT DETECTED NOT DETECTED Final   Candida krusei NOT DETECTED NOT DETECTED Final   Candida parapsilosis NOT DETECTED NOT DETECTED Final   Candida tropicalis NOT DETECTED NOT DETECTED Final   Cryptococcus neoformans/gattii NOT DETECTED NOT DETECTED Final   CTX-M ESBL NOT DETECTED NOT DETECTED Final   Carbapenem resistance IMP NOT DETECTED NOT DETECTED Final   Carbapenem resistance KPC NOT DETECTED NOT DETECTED Final   Carbapenem resistance NDM NOT DETECTED NOT DETECTED Final   Carbapenem resist OXA 48 LIKE NOT DETECTED NOT DETECTED Final   Carbapenem resistance VIM NOT DETECTED NOT DETECTED Final    Comment: Performed at [facility], [address] [place], [place] [address]  Blood culture (routine x 2)     Status: None (Preliminary result)   Collection Time: 03/26/20  3:00 PM   Specimen: BLOOD RIGHT ARM  Result Value Ref Range Status   Specimen Description BLOOD RIGHT ARM  Final   Special Requests   Final    BOTTLES DRAWN AEROBIC AND ANAEROBIC Blood Culture adequate volume   Culture  Setup Time   Final    IN BOTH AEROBIC AND ANAEROBIC BOTTLES GRAM NEGATIVE RODS CRITICAL VALUE NOTED.  VALUE IS CONSISTENT WITH PREVIOUSLY REPORTED AND CALLED VALUE.    Culture   Final    NO GROWTH < 24 HOURS Performed at [facility], [address] [place], [place] [address]    Report Status PENDING  Incomplete  Respiratory Panel by PCR     Status: None  Collection Time: 03/26/20  6:49 PM   Specimen: Nasopharyngeal Swab; Respiratory  Result Value Ref Range Status   Adenovirus NOT DETECTED NOT DETECTED Final   Coronavirus 229E NOT DETECTED NOT DETECTED Final    Comment: (NOTE) The Coronavirus on the Respiratory Panel, DOES NOT test for the novel  Coronavirus (2019 nCoV)    Coronavirus HKU1 NOT DETECTED NOT DETECTED Final   Coronavirus NL63 NOT DETECTED NOT DETECTED Final   Coronavirus OC43 NOT DETECTED NOT DETECTED Final   Metapneumovirus NOT DETECTED NOT DETECTED Final   Rhinovirus / Enterovirus NOT DETECTED NOT DETECTED Final   Influenza A NOT DETECTED NOT DETECTED Final   Influenza B NOT DETECTED NOT DETECTED Final   Parainfluenza Virus 1 NOT DETECTED NOT DETECTED Final   Parainfluenza Virus 2 NOT DETECTED NOT DETECTED Final   Parainfluenza Virus 3 NOT DETECTED NOT DETECTED Final   Parainfluenza Virus 4 NOT DETECTED NOT DETECTED Final   Respiratory Syncytial Virus NOT DETECTED NOT DETECTED Final   Bordetella pertussis NOT DETECTED NOT DETECTED Final   Chlamydophila pneumoniae NOT DETECTED NOT DETECTED Final   Mycoplasma pneumoniae NOT DETECTED NOT DETECTED Final    Comment: Performed at [facility], [address], [place], [place] [address]  MRSA PCR Screening     Status: None   Collection Time: 03/27/20 12:14 AM   Specimen: Nasal Mucosa; Nasopharyngeal  Result Value Ref Range Status   MRSA by PCR NEGATIVE NEGATIVE Final    Comment:        The GeneXpert MRSA Assay (FDA approved for NASAL specimens only), is one component of a comprehensive MRSA colonization surveillance program. It is not intended to diagnose MRSA infection nor to guide or monitor treatment for MRSA infections. Performed at [facility], [address], [place], [place] [address]       Radiology Studies: DG Abd 1 View  Result Date: 03/27/2020 CLINICAL DATA:  Obstipation EXAM: ABDOMEN - 1 VIEW COMPARISON:  01/31/2019 FINDINGS: Increased  stool in rectum and RIGHT colon. Nonobstructive bowel gas pattern. No bowel dilatation or bowel wall thickening. Osseous demineralization with levoconvex scoliosis and degenerative changes of lumbar spine. Extensive atherosclerotic calcifications of aorta, iliac arteries, and visceral arteries. IMPRESSION: Increased stool in rectum and RIGHT colon. Electronically Signed   By: Ulyses Southward M.D.   On: 03/27/2020 11:14   CT Head Wo Contrast  Result Date: 03/26/2020 CLINICAL DATA:  Mental status change, unknown cause; altered mental status EXAM: CT HEAD WITHOUT CONTRAST TECHNIQUE: Contiguous axial images were obtained from the base of the skull through the vertex without intravenous contrast. COMPARISON:  CT head 02/01/2019. FINDINGS: Brain: Advanced cerebral atrophy. There is apparent abnormal hypodensity within the right pons, as well as right midbrain and inferior right thalamus (series 3, images 15-17) (series 5, image 38). This could reflect beam hardening artifact but is suspicious for acute infarction. Advanced ill-defined hypoattenuation within the cerebral white matter is nonspecific, but compatible chronic small vessel ischemic disease. Redemonstrated bilateral basal ganglia mineralization. There is no acute intracranial hemorrhage. No demarcated cortical infarct. No extra-axial fluid collection. No evidence of intracranial mass. No midline shift. Vascular: There is focal hyperdensity within the distal basilar artery (series 5, image 31) (series 3, image 15). Atherosclerotic calcifications. Skull: Normal. Negative for fracture or focal lesion. Sinuses/Orbits: Visualized orbits show no acute finding. Mild ethmoid sinus mucosal thickening. Moderate mucosal thickening and frothy secretions within a small right maxillary sinus. Associated chronic reactive osteitis. No significant mastoid effusion. These  results were called by telephone at the time of interpretation on 03/26/2020 at 8:09 pm to provider Dr.  Bernette Mayers, who verbally acknowledged these results. IMPRESSION: Apparent abnormal hypodensity within the right pons, as well as right midbrain and inferior right thalamus. This could reflect beam hardening artifact but is suspicious for acute infarct. Additionally, there is focal hyperdensity within the distal basilar artery suspicious for possible endoluminal thrombus. Consider CTA head/neck for further evaluation. Background advanced generalized cerebral atrophy and chronic small vessel ischemic disease. Chronic right maxillary sinusitis. Electronically Signed   By: Jackey Loge DO   On: 03/26/2020 20:10   DG Chest Portable 1 View  Result Date: 03/26/2020 CLINICAL DATA:  Hypoxia, altered mental status. EXAM: PORTABLE CHEST 1 VIEW COMPARISON:  None. FINDINGS: Bulging contour of the ascending aorta, concerning for aneurysm. Aortic atherosclerosis. Mild enlargement the cardiac silhouette. Postsurgical changes of CABG with median sternotomy. Linear right basilar opacities. No confluent consolidation. No visible pleural effusions or pneumothorax. No acute osseous abnormality. IMPRESSION: 1. Findings concerning for aneurysm of the ascending aorta. Recommend CTA chest to further evaluate. 2. Linear right basilar opacities, favor atelectasis. Findings discussed with Dr. Julieanne Manson Via telephone at 2:44 PM. Electronically Signed   By: Feliberto Harts MD   On: 03/26/2020 14:47   ECHOCARDIOGRAM COMPLETE  Result Date: 03/27/2020    ECHOCARDIOGRAM REPORT   Patient Name:   [facility] Date of Exam: 03/27/2020 Medical Rec #:  [id number]     Height:       62.0 in Accession #:    [id number]    Weight:       114.0 lb Date of Birth:  [birth year]     BSA:          1.505 m Patient Age:    79 years      BP:           109/65 mmHg Patient Gender: F             HR:           98 bpm. Exam Location:  Inpatient Procedure: 2D Echo, Cardiac Doppler and Color Doppler Indications:    Stroke  History:        Patient has no prior history of  Echocardiogram examinations.                 CAD, Prior CABG; Risk Factors:Hypertension, Dyslipidemia and                 Former Smoker.                 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 Procedure Date: 2008.  Sonographer:    Ross Ludwig RDCS (AE) Referring Phys: [id number] ANASTASSIA DOUTOVA  Sonographer Comments: Patient unable to sniff for IVC collapse test. IMPRESSIONS  1. 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 V max 3.2 m/s, MG 21.5 mmHG, EOA 1.07 cm2, DI 0.34. The valve leaflets appear thickened with restricted leaflet motion. The jet is triangular and not rounded (AT <100 msec). Suspect there is mild to moderate stenosis of this prosthetic valve due to the reported age. The aortic valve has been repaired/replaced. Aortic valve regurgitation is not visualized. There is a bioprosthetic valve present in the aortic position. Procedure Date: 2008.  2. Asending aortic aneurysm up to 63 mm. This was seen on recent CTA. Aortic dilatation noted.  3. Left ventricular ejection fraction, by estimation, is 60 to 65%. The left ventricle has normal function. The left ventricle has no regional wall motion abnormalities. Left ventricular diastolic parameters are consistent with Grade I diastolic dysfunction (impaired relaxation).  4. Right ventricular systolic function is normal. The right ventricular size is normal. There is normal pulmonary artery systolic pressure. The estimated right ventricular systolic pressure is 28.0 mmHg.  5. The mitral valve is degenerative. Trivial mitral valve regurgitation. No evidence of mitral stenosis.  6. Tricuspid valve regurgitation is mild to moderate.  7. The inferior vena cava is normal in size with greater than 50% respiratory variability, suggesting right atrial pressure of 3 mmHg. FINDINGS  Left Ventricle: Left ventricular ejection fraction, by estimation, is 60 to 65%. The left ventricle has normal function. The left  ventricle has no regional wall motion abnormalities. The left ventricular internal cavity size was small. There is no left ventricular hypertrophy. Left ventricular diastolic parameters are consistent with Grade I diastolic dysfunction (impaired relaxation). Normal left ventricular filling pressure. Right Ventricle: The right ventricular size is normal. No increase in right ventricular wall thickness. Right ventricular systolic function is normal. There is normal pulmonary artery systolic pressure. The tricuspid regurgitant velocity is 2.50 m/s, and  with an assumed right atrial pressure of 3 mmHg, the estimated right ventricular systolic pressure is 28.0 mmHg. Left Atrium: Left atrial size was normal in size. Right Atrium: Right atrial size was normal in size. Pericardium: Trivial pericardial effusion is present. Mitral Valve: The mitral valve is degenerative in appearance. Trivial mitral valve regurgitation. No evidence of mitral valve stenosis. Tricuspid Valve: The tricuspid valve is grossly normal. Tricuspid valve regurgitation is mild to moderate. No evidence of tricuspid stenosis. 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 V max 3.2 m/s, MG 21.5 mmHG, EOA 1.07 cm2, DI 0.34. The valve leaflets appear thickened with restricted leaflet motion. The jet is triangular and not rounded (AT <100 msec). Suspect there is mild to moderate stenosis of this prosthetic valve due to the reported age. The aortic valve has been repaired/replaced. Aortic valve regurgitation is not visualized. Aortic valve mean gradient measures 21.5 mmHg. Aortic valve peak gradient measures 41.9 mmHg. Aortic valve area, by VTI measures 1.08 cm. There is a bioprosthetic valve present in the aortic position. Procedure Date: 2008. Pulmonic Valve: The pulmonic valve was grossly normal. Pulmonic valve regurgitation is not visualized. No evidence of pulmonic stenosis. Aorta: Asending aortic aneurysm up to 63 mm. This was  seen on recent CTA. Aortic dilatation noted. Venous: The inferior vena cava is normal in size with greater than 50% respiratory variability, suggesting right atrial pressure of 3 mmHg. IAS/Shunts: The atrial septum is grossly normal.  LEFT VENTRICLE PLAX 2D LVIDd:         3.90 cm  Diastology LVIDs:         2.80 cm  LV e' medial:    6.74 cm/s LV PW:         1.09 cm  LV E/e' medial:  10.4 LV IVS:        1.10 cm  LV e' lateral:   11.60 cm/s LVOT diam:     2.00 cm  LV E/e' lateral: 6.1 LV SV:         68 LV SV Index:   45 LVOT Area:     3.14 cm  RIGHT VENTRICLE            IVC RV Basal diam:  3.20 cm  IVC diam: 1.80 cm RV S prime:     7.62 cm/s TAPSE (M-mode): 0.9 cm LEFT ATRIUM           Index       RIGHT ATRIUM           Index LA diam:      2.40 cm 1.59 cm/m  RA Area:     14.20 cm LA Vol (A2C): 22.6 ml 15.01 ml/m RA Volume:   35.60 ml  23.65 ml/m LA Vol (A4C): 19.8 ml 13.15 ml/m  AORTIC VALVE AV Area (Vmax):    1.00 cm AV Area (Vmean):   1.04 cm AV Area (VTI):     1.08 cm AV Vmax:           323.67 cm/s AV Vmean:          215.500 cm/s AV VTI:            0.633 m AV Peak Grad:      41.9 mmHg AV Mean Grad:      21.5 mmHg LVOT Vmax:         103.00 cm/s LVOT Vmean:        71.600 cm/s LVOT VTI:          0.217 m LVOT/AV VTI ratio: 0.34  AORTA Ao Root diam: 4.40 cm MITRAL VALVE               TRICUSPID VALVE MV Area (PHT): 2.43 cm    TR Peak grad:   25.0 mmHg MV Decel Time: 312 msec    TR Vmax:        250.00 cm/s MV E velocity: 70.30 cm/s MV A velocity: 58.30 cm/s  SHUNTS MV E/A ratio:  1.21        Systemic VTI:  0.22 m                            Systemic Diam: 2.00 cm Lennie Odor MD Electronically signed by Lennie Odor MD Signature Date/Time: 03/27/2020/10:59:31 AM    Final    CT Angio Chest/Abd/Pel for Dissection W and/or Wo Contrast  Result Date: 03/26/2020 CLINICAL DATA:  Widening of the mediastinum. EXAM: CT ANGIOGRAPHY CHEST, ABDOMEN AND PELVIS TECHNIQUE: Non-contrast CT of the chest was initially  obtained. Multidetector CT imaging through the chest, abdomen and pelvis was performed using the standard protocol during bolus administration of intravenous contrast. Multiplanar reconstructed images and MIPs were obtained and reviewed to evaluate the vascular anatomy. CONTRAST:  28mL OMNIPAQUE IOHEXOL 350 MG/ML SOLN COMPARISON:  None. FINDINGS: CTA CHEST FINDINGS Cardiovascular: There is an ascending thoracic aortic aneurysm measuring approximately 6.3 cm in diameter. There is no CT evidence for a dissection, however evaluation is limited by contrast timing. The sino-tubular junction measures up to approximately 5 cm in diameter. Atherosclerotic changes are noted of the thoracic aorta. The arch vessels appear to be grossly patent where visualized. The main pulmonary artery is dilated measuring 3.4 cm. Contrast bolus timing is suboptimal for the detection of acute pulmonary emboli. The patient is status post prior median sternotomy and aortic valve replacement. Mediastinum/Nodes: -- No mediastinal lymphadenopathy. -- No hilar lymphadenopathy. -- No axillary lymphadenopathy. -- No supraclavicular lymphadenopathy. -- Normal thyroid gland where visualized. -there is a moderate-sized hiatal hernia. Lungs/Pleura: There are moderate emphysematous changes bilaterally. The lung fields are suboptimally evaluated secondary to respiratory motion artifact. There is an airspace opacity in the right lower lobe that appears to be enhancing  and therefore is favored to represent an area of atelectasis. There is no pneumothorax or large pleural effusion. The trachea is unremarkable. Musculoskeletal: There are old healed left-sided rib fractures. No acute displaced fracture identified on today's study. Review of the MIP images confirms the above findings. CTA ABDOMEN AND PELVIS FINDINGS VASCULAR Aorta: There are atherosclerotic changes throughout the abdominal aorta without evidence for an aneurysm. Celiac: There is atherosclerotic  disease at the origin of the celiac axis resulting in mild stenosis. SMA: Patent without evidence of aneurysm, dissection, vasculitis or significant stenosis. 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 There is a moderate grade stenosis of the left renal artery. IMA: Patent without evidence of aneurysm, dissection, vasculitis or significant stenosis. Inflow: There is moderate narrowing throughout the bilateral common iliac arteries, right worse than left. There is moderate narrowing throughout the bilateral external iliac arteries, right worse than left. 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 There is complete occlusion of the proximal left SFA. Veins: No obvious venous abnormality within the limitations of this arterial phase study. Review of the MIP images confirms the above findings. NON-VASCULAR Hepatobiliary: The liver is normal. There is questionable mild gallbladder wall thickening, suboptimally evaluated secondary to streak artifact from the patient's arms.There is no biliary ductal dilation. Pancreas: Normal contours without ductal dilatation. No peripancreatic fluid collection. Spleen: Unremarkable. Adrenals/Urinary Tract: --Adrenal glands: Unremarkable. --Right kidney/ureter: No hydronephrosis or radiopaque kidney stones. --Left kidney/ureter: No hydronephrosis or radiopaque kidney stones. --Urinary bladder: Unremarkable. Stomach/Bowel: --Stomach/Duodenum: No hiatal hernia or other gastric abnormality. Normal duodenal course and caliber. --Small bowel: Unremarkable. --Colon: There is a large amount of stool at the level of the rectum. --Appendix: Normal. Lymphatic: --No retroperitoneal lymphadenopathy. --No mesenteric lymphadenopathy. --No pelvic or inguinal lymphadenopathy. Reproductive: Unremarkable Other: No ascites or free air. The abdominal wall is normal. Musculoskeletal. There is a chronic appearing compression fracture of L1 vertebral body. Multilevel  degenerative changes are noted throughout the visualized thoracolumbar spine. No evidence for an acute fracture on today's study. Review of the MIP images confirms the above findings. IMPRESSION: 1. Ascending thoracic aortic aneurysm measuring approximately 6.3 cm in diameter. There is no CT evidence for a dissection, however evaluation is limited by contrast timing. Surgical consultation is recommended. 2. Vascular disease involving the abdomen and bilateral lower extremities as detailed above. 3. Very large amount of stool at the level of the rectum. 4. Questionable mild gallbladder wall thickening. Correlation with laboratory studies is recommended. 5. Right basilar atelectasis. 6. Status post CABG and aortic valve replacement. Aortic Atherosclerosis (ICD10-I70.0) and Emphysema (ICD10-J43.9). Electronically Signed   By: Katherine Mantle M.D.   On: 03/26/2020 18:02   US Abdomen Limited RUQ (LIVER/GB)  Result Date: 03/26/2020 CLINICAL DATA:  Cholecystitis EXAM: ULTRASOUND ABDOMEN LIMITED RIGHT UPPER QUADRANT COMPARISON:  CT from the same day FINDINGS: Gallbladder: There is mild gallbladder wall thickening with the gallbladder wall measuring approximately 3 mm. There is sludge without evidence for cholelithiasis. The sonographic Eulah Pont sign is negative. Common bile duct: Diameter: 6 mm Liver: No focal lesion identified. Within normal limits in parenchymal echogenicity. Portal vein is patent on color Doppler imaging with normal direction of blood flow towards the liver. Other: None. IMPRESSION: Gallbladder sludge without sonographic evidence for acute cholecystitis. Electronically Signed   By: Katherine Mantle M.D.   On: 03/26/2020 20:21    Scheduled Meds: .  stroke: mapping our early stages of recovery book   Does not apply Once  .  albuterol  2 puff Inhalation Daily  . divalproex  125 mg Oral Q12H   Continuous Infusions: . cefTRIAXone (ROCEPHIN)  IV Stopped (03/26/20 1807)     LOS: 0 days    Time spent: 40 minutes  Monta Maiorana Estill Cotta, MD [facility]  If 7PM-7AM, please contact night-coverage [email or web address] 03/27/2020, 2:45 PM

## 2020-03-27 NOTE — TOC Initial Note (Signed)
Transition of Care [facility]) - Initial/Assessment Note    Patient Details  Name: Jasmine Carrillo MRN: [id number] Date of Birth: 14-May-1941  [facility] of [facility]) CM/SW Contact:    Erin Sons, LCSW Phone Number: 03/27/2020, 11:51 AM  Clinical Narrative:      CSW contacted pt daughter Jasmine Carrillo and confirmed plan for pt to return to [facility] upon d/c. CSW contacted [facility] and confirmed they would be able to take pt back over the weekend if needed.              Expected Discharge Plan: Acute to Acute Transfer Barriers to Discharge: Continued Medical Work up   Patient Goals and CMS Choice Patient states their goals for this hospitalization and ongoing recovery are:: Daughter wants return to [facility]      Expected Discharge Plan and Services Expected Discharge Plan: Acute to Acute Transfer       Living arrangements for the past 2 months: Skilled Nursing Facility                                      Prior Living Arrangements/Services Living arrangements for the past 2 months: Skilled Nursing Facility Lives with:: Facility Resident          Need for Family Participation in Patient Care: Yes (Comment) Care giver support system in place?: Yes (comment)      Activities of Daily Living      Permission Sought/Granted                  Emotional Assessment         Alcohol / Substance Use: Not Applicable Psych Involvement: No (comment)  Admission diagnosis:  Cholecystitis [K81.9] Obstipation [K59.00] Hypoxia [R09.02] SIRS (systemic inflammatory response syndrome) (HCC) [R65.10] Altered mental status, unspecified altered mental status type [R41.82] Community acquired pneumonia, unspecified laterality [J18.9] Patient Active Problem List   Diagnosis Date Noted  . SIRS (systemic inflammatory response syndrome) (HCC) 03/26/2020  . Ascending aortic aneurysm (HCC) 03/26/2020  . Acute lower UTI 03/26/2020  . Obstipation 03/26/2020  . Acute  metabolic encephalopathy 03/26/2020  . Acute respiratory failure with hypoxia (HCC) 03/26/2020  . Elevated LFTs 03/26/2020  . Abnormal head CT 03/26/2020  . Essential hypertension 02/07/2019  . Hyperlipidemia 02/07/2019  . Dementia (HCC) 02/07/2019  . Closed right ankle fracture 02/07/2019  . Other fracture of left great toe, initial encounter for closed fracture 02/07/2019  . Recurrent falls 02/01/2019   PCP:  Laurann Montana, MD Pharmacy:  No Pharmacies Listed    Social Determinants of Health (SDOH) Interventions    Readmission Risk Interventions No flowsheet data found.

## 2020-03-27 NOTE — Evaluation (Signed)
Clinical/Bedside Swallow Evaluation Patient Details  Name: Lamari Youngers MRN: [id number] Date of Birth: 06/13/1940  Today's Date: 03/27/2020 Time: SLP Start Time (ACUTE ONLY): 1421 SLP Stop Time (ACUTE ONLY): 1445 SLP Time Calculation (min) (ACUTE ONLY): 24 min  Past Medical History:  Past Medical History:  Diagnosis Date  . Abnormal posture   . Anxiety   . Atherosclerotic heart disease of native coronary artery without angina pectoris   . Cognitive communication deficit   . Depression   . Falls frequently   . Fracture    right lower leg   . Hyperlipidemia   . Hypertension   . Repeated falls   . Unspecified dementia without behavioral disturbance [facility])    Past Surgical History:  Past Surgical History:  Procedure Laterality Date  . CORONARY ARTERY BYPASS GRAFT    . NECK SURGERY     Neck bone replacement   . VALVE REPLACEMENT     HPI:  Bebe Moncure is a 79 y.o. female with medical history significant for advanced Alzheimer's dz who is total care, HTN, HLD, CAD sp CABG sp porcine valve, CABG in 2008, chronic constipation, depression recurrent falls, seizure-like activities, myoclonus and Parkinson's. Presents to ED from [facility] with AMS. Work-up pending. Pt was evaluated by SLP during September 2020 admission, at which time swallow function was normal and a regular diet with thin liquids was recommended.    Assessment / Plan / Recommendation Clinical Impression  Pt presents with quite functional swallow.  Daughter was at bedside. She demonstrated excellent anticipation and recognition of approaching utensils, straw, etc.  Demonstrated normal mastication of regular solids and preserved use of straw; swallow response appeared to be swift;  there were no s/s of aspiration with any consistency, including thin liquids.  Pt requires careful hand-feeding.  Recommend resuming a regular diet, thin liquids; meds whole with puree.  Daughter asks that staff who feed her mother cue her  with the words "crunchy, crunchy" before solid foods are placed in her mouth.  There are no further SLP needs. Our service will sign off.   SLP Visit Diagnosis: Dysphagia, unspecified (R13.10)    Aspiration Risk  No limitations    Diet Recommendation   regular solids, thin liquids  Medication Administration: Whole meds with puree    Other  Recommendations Oral Care Recommendations: Oral care QID   Follow up Recommendations None      Frequency and Duration            Prognosis        Swallow Study   General Date of Onset: 03/26/20 HPI: Anelise Staron is a 79 y.o. female with medical history significant for advanced Alzheimer's dz who is total care, HTN, HLD, CAD sp CABG sp porcine valve, CABG in 2008, chronic constipation, depression recurrent falls, seizure-like activities, myoclonus and Parkinson's. Presents to ED from [facility] with AMS. Work-up pending. Pt was evaluated by SLP during September 2020 admission, at which time swallow function was normal and a regular diet with thin liquids was recommended.  Type of Study: Bedside Swallow Evaluation Previous Swallow Assessment: see HPI Diet Prior to this Study: NPO Temperature Spikes Noted: No Respiratory Status: Room air History of Recent Intubation: No Behavior/Cognition: Alert Oral Cavity Assessment: Within Functional Limits Oral Care Completed by SLP: No Self-Feeding Abilities: Total assist Patient Positioning: Upright in bed Baseline Vocal Quality: Normal Volitional Cough: Cognitively unable to elicit Volitional Swallow: Unable to elicit    Oral/Motor/Sensory Function Overall Oral Motor/Sensory Function:  Within functional limits   Ice Chips Ice chips: Within functional limits   Thin Liquid Thin Liquid: Within functional limits    Nectar Thick Nectar Thick Liquid: Not tested   Honey Thick Honey Thick Liquid: Not tested   Puree Puree: Within functional limits   Solid     Solid: Within functional limits       Blenda Mounts Laurice 03/27/2020,2:55 PM   Kahealani Yankovich L. Samson Frederic, MA CCC/SLP Acute Rehabilitation Services Office number [phone] Pager [phone]

## 2020-03-27 NOTE — Evaluation (Signed)
Occupational Therapy Evaluation Patient Details Name: Jasmine Carrillo MRN: [id number] DOB: 09-03-1940 Today's Date: 03/27/2020    History of Present Illness Jasmine Carrillo is a 79 y.o. female with medical history significant of dementia, HTN, HLD, CAD sp CABG sp porcine valve, CABG in 2008, chronic constipation, depression recurrent falls, seizure-like activities, myoclonus and Parkinson's. Presents to ED from her SNF with AMS   Clinical Impression   PTA pt a long term resident at [facility]. Pt is dependent with mobility and ADLs. At time of eval, pt required total A +2 for bed mobility. She was able to sit EOB ~5-7 mins with max truncal support. During this time, OT played music in attempts to facilitate automatic response. Pt was tearful to one song, laughed to another. Otherwise, pt minimally verbal and only making short periods of intermittent eye contact. At this time, pt is most appropriate for custodial care through [facility]. OT will sign off, thank you for this consult.    Follow Up Recommendations  SNF (return as long term resident, no therapy)    Equipment Recommendations  None recommended by OT    Recommendations for Other Services       Precautions / Restrictions Precautions Precautions: Fall Restrictions Weight Bearing Restrictions: No      Mobility Bed Mobility Overal bed mobility: Needs Assistance Bed Mobility: Supine to Sit;Sit to Supine     Supine to sit: Total assist;+2 for physical assistance;+2 for safety/equipment Sit to supine: Total assist;+2 for physical assistance;+2 for safety/equipment   General bed mobility comments: total A helicopter style to sit EOB with max truncal support    Transfers                      Balance Overall balance assessment: Needs assistance Sitting-balance support: Feet supported Sitting balance-Leahy Scale: Zero Sitting balance - Comments: max A for trunk support                                    ADL either performed or assessed with clinical judgement   ADL Overall ADL's : At baseline                                       General ADL Comments: Pt is at baseline, which is total care for all ADLs     Vision   Additional Comments: Pt with difficulty maintaining visual attention     Perception     Praxis      Pertinent Vitals/Pain Pain Assessment: Faces Faces Pain Scale: Hurts a little bit Pain Location: generalized with movement Pain Descriptors / Indicators: Aching Pain Intervention(s): Monitored during session     Hand Dominance     Extremity/Trunk Assessment Upper Extremity Assessment Upper Extremity Assessment: Generalized weakness   Lower Extremity Assessment Lower Extremity Assessment: Generalized weakness;Defer to PT evaluation       Communication Communication Communication: Receptive difficulties;Expressive difficulties   Cognition Arousal/Alertness: Awake/alert Behavior During Therapy: Flat affect Overall Cognitive Status: History of cognitive impairments - at baseline                                 General Comments: history of dementia and parkinsons at baseline. Minimally verbal other than occasionally answering "yes". Emotionally reactive to  some music played in session   General Comments       Exercises     Shoulder Instructions      Home Living Family/patient expects to be discharged to:: Skilled nursing facility                                 Additional Comments: Long term resident at [facility]      Prior Functioning/Environment Level of Independence: Needs assistance  Gait / Transfers Assistance Needed: assume pt spends most of time in bed or w/c due to BLE tightness/contracture at heels ADL's / Homemaking Assistance Needed: dependent            OT Problem List: Decreased strength;Decreased knowledge of use of DME or AE;Decreased knowledge of precautions;Decreased  activity tolerance;Impaired balance (sitting and/or standing);Decreased safety awareness;Decreased cognition      OT Treatment/Interventions:      OT Goals(Current goals can be found in the care plan section) Acute Rehab OT Goals OT Goal Formulation: All assessment and education complete, DC therapy  OT Frequency:     Barriers to D/C:            Co-evaluation              AM-PAC OT "6 Clicks" Daily Activity     Outcome Measure Help from another person eating meals?: Total Help from another person taking care of personal grooming?: Total Help from another person toileting, which includes using toliet, bedpan, or urinal?: Total Help from another person bathing (including washing, rinsing, drying)?: Total Help from another person to put on and taking off regular upper body clothing?: Total Help from another person to put on and taking off regular lower body clothing?: Total 6 Click Score: 6   End of Session Nurse Communication: Mobility status  Activity Tolerance: Patient tolerated treatment well Patient left: in bed;with call bell/phone within reach  OT Visit Diagnosis: Unsteadiness on feet (R26.81);Other abnormalities of gait and mobility (R26.89);Other symptoms and signs involving cognitive function                Time: 1610-9604 OT Time Calculation (min): 27 min Charges:  OT General Charges $OT Visit: 1 Visit OT Evaluation $OT Eval Moderate Complexity: 1 Mod  Dalphine Handing, MSOT, OTR/L Acute Rehabilitation Services [facility] Office Number: [phone] Pager: [phone]  Dalphine Handing 03/27/2020, 1:33 PM

## 2020-03-28 DIAGNOSIS — Z515 Encounter for palliative care: Secondary | ICD-10-CM

## 2020-03-28 LAB — CBC
HCT: 32 % — ABNORMAL LOW (ref 36.0–46.0)
Hemoglobin: 10.1 g/dL — ABNORMAL LOW (ref 12.0–15.0)
MCH: 27.5 pg (ref 26.0–34.0)
MCHC: 31.6 g/dL (ref 30.0–36.0)
MCV: 87.2 fL (ref 80.0–100.0)
Platelets: 151 10*3/uL (ref 150–400)
RBC: 3.67 MIL/uL — ABNORMAL LOW (ref 3.87–5.11)
RDW: 16.5 % — ABNORMAL HIGH (ref 11.5–15.5)
WBC: 3 10*3/uL — ABNORMAL LOW (ref 4.0–10.5)
nRBC: 0 % (ref 0.0–0.2)

## 2020-03-28 LAB — COMPREHENSIVE METABOLIC PANEL
ALT: 101 U/L — ABNORMAL HIGH (ref 0–44)
AST: 66 U/L — ABNORMAL HIGH (ref 15–41)
Albumin: 2.6 g/dL — ABNORMAL LOW (ref 3.5–5.0)
Alkaline Phosphatase: 143 U/L — ABNORMAL HIGH (ref 38–126)
Anion gap: 7 (ref 5–15)
BUN: 21 mg/dL (ref 8–23)
CO2: 25 mmol/L (ref 22–32)
Calcium: 8.3 mg/dL — ABNORMAL LOW (ref 8.9–10.3)
Chloride: 111 mmol/L (ref 98–111)
Creatinine, Ser: 0.39 mg/dL — ABNORMAL LOW (ref 0.44–1.00)
GFR, Estimated: >60 mL/min (ref >60)
Glucose, Bld: 92 mg/dL (ref 70–99)
Potassium: 3.8 mmol/L (ref 3.5–5.1)
Sodium: 143 mmol/L (ref 135–145)
Total Bilirubin: 0.4 mg/dL (ref 0.3–1.2)
Total Protein: 5.3 g/dL — ABNORMAL LOW (ref 6.5–8.1)

## 2020-03-28 LAB — PROTIME-INR
INR: 1 (ref 0.8–1.2)
Prothrombin Time: 13.2 seconds (ref 11.4–15.2)

## 2020-03-28 MED ORDER — SODIUM CHLORIDE 0.9 % IV SOLN: INTRAVENOUS | Status: DC

## 2020-03-28 MED ORDER — SENNA 8.6 MG PO TABS
1.0000 | ORAL_TABLET | Freq: Two times a day (BID) | ORAL | Status: DC
  Administered 2020-03-28 – 2020-03-29 (×4): 8.6 mg via ORAL
  Filled 2020-03-28 (×4): qty 1

## 2020-03-28 MED ORDER — ENOXAPARIN SODIUM 40 MG/0.4ML ~~LOC~~ SOLN
40.0000 mg | SUBCUTANEOUS | Status: DC
  Administered 2020-03-28 – 2020-03-29 (×2): 40 mg via SUBCUTANEOUS
  Filled 2020-03-28 (×3): qty 0.4

## 2020-03-28 NOTE — Progress Notes (Signed)
PROGRESS NOTE    Jasmine Landsberglena Toth  ZOX:[id number] DOB: 06/18/1940 DOA: 03/26/2020 PCP: Laurann MontanaWhite, Cynthia, MD   Brief Narrative:  Patient is a 79 year old female with past medical history of dementia, hypertension, hyperlipidemia, coronary artery disease status post CABG, chronic constipation, depression, recurrent falls presented with altered mental status.  ED course: Patient was noted to be tachycardic and febrile or fever of 100.9, CMP shows elevated LFTs, chest x-ray shows widened mediastinum.  CTA chest showed 6.3 cm ascending aortic aneurysm.  UA showed bacteria, imaging showed obstipation with some gallbladder wall abnormality.  CT scan showed no evidence of cholecystitis.  She was also placed on 3 L of oxygen due to hypoxia.  Admitted for acute metabolic encephalopathy.    Assessment & Plan:  E coli Bacteremia: -Blood culture from 10/28 came back positive for E. coli.  Susceptibilities to follow. -Continue IV Rocephin.  Will repeat blood culture -Patient remained afebrile overnight.  Other vital signs within normal limits.  Acute metabolic encephalopathy: -In the setting of E. coli bacteremia with underlying severe Alzheimer's dementia & dehydration  -Continue IV fluids and IV Rocephin -No leukocytosis, lactic acid, ammonia, TSH, lipase, troponin: WNL,  -Consult PT/OT/SLP -On fall/aspiration precautions  Acute hypoxemic respiratory failure: Resolved. -Patient was requiring 3 L of oxygen via nasal cannula.  Chest x-ray and CT chest negative for infection.Marland Kitchen.  COVID-19 negative. -Now on RA  Abnormal CT head: -Suspicious for acute infarct. -Patient's family refused further aggressive intervention/imaging at this time. -Continue aspirin.  Hold statin due to elevated liver enzymes. -Consult PT/OT/SLP  Elevated liver enzymes: Improving -Ammonia: WNL.  Right upper quadrant ultrasound shows no cholecystitis. -Hepatitis panel: Negative -Monitor liver enzymes.  Coronary artery disease  status post CABG in 2008 -Continue aspirin, hold statin due to elevated liver enzymes.  Hypertension: Blood pressure soft upon admission. -Hold Coreg for now. -Continue with gentle hydration.  Resume Coreg once blood pressure is back to baseline.  Hyperlipidemia: -Reviewed lipid panel.  Hold statin due to elevated liver enzymes.  CK level: WNL  Hypomagnesemia: Replenished.  Repeat magnesium: WNL  Constipation: X-ray abdomen showed increased stool in rectum and right colon -Enema was given yesterday. -Continue senna  Ascending aortic aneurysm: Noted on CTA chest. -Family does not want further intervention.  Low hemoglobin: H&H dropped from 13.3/39.0-9.7/31.4 in 1 day.  Likely dilutional.  Today H&H is 10.1/32.0.  No signs of active bleeding.  Continue to monitor.  Transfuse as needed. -B12: 1206-elevated, low iron, normal ferritin and folate level  Dementia/depression: DC Namenda and Zocor  DVT prophylaxis: Lovenox/SCD Code Status: DNR-confirmed with patient's daughter Family Communication: None present at bedside.  Plan of care discussed with patient in length and he verbalized understanding and agreed with it.  Called patient's daughter [name]-discussed plan of care.  Questions answered.   Disposition Plan: Likely SNF with hospice  Consultants:  Palliative care  Procedures:  CT head CT chest  Antimicrobials:   Rocephin  Status is: Inpatient  Dispo: The patient is from: SNF              Anticipated d/c is to: SNF with hospice              Anticipated d/c date is: 03/30/2020              Patient currently not a stable for the discharge.         Subjective: Patient seen and examined.  Appears more alert and awake this morning.  Not following commands.  Objective:  Vitals:   03/27/20 1405 03/27/20 2124 03/28/20 0510 03/28/20 0733  BP: 129/74 108/67 99/81   Pulse: 73 78 73   Resp: 18 19 16    Temp: 98.4 F (36.9 C) 98 F (36.7 C) 98.5 F (36.9 C)    TempSrc: Oral Oral Axillary   SpO2: 98% 95% 99% 95%  Weight:      Height:        Intake/Output Summary (Last 24 hours) at 03/28/2020 1352 Last data filed at 03/28/2020 1307 Gross per 24 hour  Intake 120 ml  Output 250 ml  Net -130 ml   Filed Weights   03/26/20 1434 03/26/20 2116  Weight: 61.7 kg 51.7 kg    Examination: General exam: Appears calm and comfortable, alert and on room air Respiratory system: Clear to auscultation. Respiratory effort normal. Cardiovascular system: S1 & S2 heard, RRR. No JVD, murmurs, rubs, gallops or clicks. No pedal edema. Gastrointestinal system: Abdomen is nondistended, soft and nontender. No organomegaly or masses felt. Normal bowel sounds heard. Central nervous system: Alert but not following commands. Skin: No rashes, lesions or ulcers.     Data Reviewed: I have personally reviewed following labs and imaging studies  CBC: Recent Labs  Lab 03/26/20 1416 03/26/20 1517 03/27/20 0413 03/28/20 0120  WBC 10.2  --  4.8 3.0*  NEUTROABS 9.4*  --  4.0  --   HGB 12.6 13.3 9.7* 10.1*  HCT 40.9 39.0 31.4* 32.0*  MCV 87.4  --  85.3 87.2  PLT 195  --  153 151   Basic Metabolic Panel: Recent Labs  Lab 03/26/20 1416 03/26/20 1517 03/26/20 2246 03/27/20 0413 03/28/20 0120  NA 143 144  --  144 143  K 4.2 4.1  --  3.5 3.8  CL 106  --   --  112* 111  CO2 26  --   --  24 25  GLUCOSE 117*  --   --  96 92  BUN 25*  --   --  20 21  CREATININE 0.66  --   --  0.47 0.39*  CALCIUM 9.2  --   --  8.3* 8.3*  MG  --   --  1.3* 1.9  --   PHOS  --   --   --  2.6  --    GFR: Estimated Creatinine Clearance: 45.1 mL/min (A) (by C-G formula based on SCr of 0.39 mg/dL (L)). Liver Function Tests: Recent Labs  Lab 03/26/20 1416 03/27/20 0413 03/28/20 0120  AST 185* 96* 66*  ALT 184* 129* 101*  ALKPHOS 192* 137* 143*  BILITOT 0.8 0.6 0.4  PROT 6.9 5.5* 5.3*  ALBUMIN 3.4* 2.6* 2.6*   Recent Labs  Lab 03/26/20 1416  LIPASE 44   Recent Labs   Lab 03/26/20 1417  AMMONIA 11   Coagulation Profile: Recent Labs  Lab 03/28/20 0120  INR 1.0   Cardiac Enzymes: Recent Labs  Lab 03/26/20 2246  CKTOTAL 176   BNP (last 3 results) No results for input(s): PROBNP in the last 8760 hours. HbA1C: Recent Labs    03/27/20 0413  HGBA1C 5.8*   CBG: No results for input(s): GLUCAP in the last 168 hours. Lipid Profile: Recent Labs    03/27/20 0413  CHOL 107  HDL 47  LDLCALC 50  TRIG 52  CHOLHDL 2.3   Thyroid Function Tests: Recent Labs    03/26/20 1417  TSH 0.862   Anemia Panel: Recent Labs    03/27/20 1531  VITAMINB12 1,206*  FOLATE 27.5  FERRITIN 65  TIBC 333  IRON 12*   Sepsis Labs: Recent Labs  Lab 03/26/20 1416 03/26/20 1816 03/26/20 2246  PROCALCITON  --   --  0.55  LATICACIDVEN 1.3 1.2  --     Recent Results (from the past 240 hour(s))  Respiratory Panel by RT PCR (Flu A&B, Covid) - Nasopharyngeal Swab     Status: None   Collection Time: 03/26/20  2:45 PM   Specimen: Nasopharyngeal Swab  Result Value Ref Range Status   SARS Coronavirus 2 by RT PCR NEGATIVE NEGATIVE Final    Comment: (NOTE) SARS-CoV-2 target nucleic acids are NOT DETECTED.  The SARS-CoV-2 RNA is generally detectable in upper respiratoy specimens during the acute phase of infection. The lowest concentration of SARS-CoV-2 viral copies this assay can detect is 131 copies/mL. A negative result does not preclude SARS-Cov-2 infection and should not be used as the sole basis for treatment or other patient management decisions. A negative result may occur with  improper specimen collection/handling, submission of specimen other than nasopharyngeal swab, presence of viral mutation(s) within the areas targeted by this assay, and inadequate number of viral copies (<131 copies/mL). A negative result must be combined with clinical observations, patient history, and epidemiological information. The expected result is Negative.  Fact  Sheet for Patients:  https://www.moore.com/  Fact Sheet for Healthcare Providers:  https://www.young.biz/  This test is no t yet approved or cleared by the [place] FDA and  has been authorized for detection and/or diagnosis of SARS-CoV-2 by FDA under an Emergency Use Authorization (EUA). This EUA will remain  in effect (meaning this test can be used) for the duration of the COVID-19 declaration under Section 564(b)(1) of the Act, 21 U.S.C. section 360bbb-3(b)(1), unless the authorization is terminated or revoked sooner.     Influenza A by PCR NEGATIVE NEGATIVE Final   Influenza B by PCR NEGATIVE NEGATIVE Final    Comment: (NOTE) The Xpert Xpress SARS-CoV-2/FLU/RSV assay is intended as an aid in  the diagnosis of influenza from Nasopharyngeal swab specimens and  should not be used as a sole basis for treatment. Nasal washings and  aspirates are unacceptable for Xpert Xpress SARS-CoV-2/FLU/RSV  testing.  Fact Sheet for Patients: https://www.moore.com/  Fact Sheet for Healthcare Providers: https://www.young.biz/  This test is not yet approved or cleared by the [place] FDA and  has been authorized for detection and/or diagnosis of SARS-CoV-2 by  FDA under an Emergency Use Authorization (EUA). This EUA will remain  in effect (meaning this test can be used) for the duration of the  Covid-19 declaration under Section 564(b)(1) of the Act, 21  U.S.C. section 360bbb-3(b)(1), unless the authorization is  terminated or revoked. Performed at [facility], [address] [place], [place] [address]   Blood culture (routine x 2)     Status: Abnormal (Preliminary result)   Collection Time: 03/26/20  2:46 PM   Specimen: BLOOD  Result Value Ref Range Status   Specimen Description BLOOD LEFT ANTECUBITAL  Final   Special Requests   Final    BOTTLES DRAWN AEROBIC AND ANAEROBIC Blood Culture  adequate volume   Culture  Setup Time   Final    IN BOTH AEROBIC AND ANAEROBIC BOTTLES GRAM NEGATIVE RODS Organism ID to follow CRITICAL RESULT CALLED TO, READ BACK BY AND VERIFIED WITH: K AMEND PHARMD 03/27/20 0557 JDW    Culture (A)  Final    ESCHERICHIA COLI SUSCEPTIBILITIES TO FOLLOW Performed at [facility], [address], [place], [place] [address]    Report Status PENDING  Incomplete  Blood Culture ID Panel (Reflexed)     Status: Abnormal   Collection Time: 03/26/20  2:46 PM  Result Value Ref Range Status   Enterococcus faecalis NOT DETECTED NOT DETECTED Final   Enterococcus Faecium NOT DETECTED NOT DETECTED Final   Listeria monocytogenes NOT DETECTED NOT DETECTED Final   Staphylococcus species NOT DETECTED NOT DETECTED Final   Staphylococcus aureus (BCID) NOT DETECTED NOT DETECTED Final   Staphylococcus epidermidis NOT DETECTED NOT DETECTED Final   Staphylococcus lugdunensis NOT DETECTED NOT DETECTED Final   Streptococcus species NOT DETECTED NOT DETECTED Final   Streptococcus agalactiae NOT DETECTED NOT DETECTED Final   Streptococcus pneumoniae NOT DETECTED NOT DETECTED Final   Streptococcus pyogenes NOT DETECTED NOT DETECTED Final   A.calcoaceticus-baumannii NOT DETECTED NOT DETECTED Final   Bacteroides fragilis NOT DETECTED NOT DETECTED Final   Enterobacterales DETECTED (A) NOT DETECTED Final    Comment: Enterobacterales represent a large order of gram negative bacteria, not a single organism. CRITICAL RESULT CALLED TO, READ BACK BY AND VERIFIED WITH: K AMEND PHARMD 03/27/20 0557 JDW    Enterobacter cloacae complex NOT DETECTED NOT DETECTED Final   Escherichia coli DETECTED (A) NOT DETECTED Final    Comment: CRITICAL RESULT CALLED TO, READ BACK BY AND VERIFIED WITH: K AMEND PHARMD 03/27/20 0557 JDW    Klebsiella aerogenes NOT DETECTED NOT DETECTED Final   Klebsiella oxytoca NOT DETECTED NOT DETECTED Final   Klebsiella pneumoniae NOT DETECTED NOT DETECTED  Final   Proteus species NOT DETECTED NOT DETECTED Final   Salmonella species NOT DETECTED NOT DETECTED Final   Serratia marcescens NOT DETECTED NOT DETECTED Final   Haemophilus influenzae NOT DETECTED NOT DETECTED Final   Neisseria meningitidis NOT DETECTED NOT DETECTED Final   Pseudomonas aeruginosa NOT DETECTED NOT DETECTED Final   Stenotrophomonas maltophilia NOT DETECTED NOT DETECTED Final   Candida albicans NOT DETECTED NOT DETECTED Final   Candida auris NOT DETECTED NOT DETECTED Final   Candida glabrata NOT DETECTED NOT DETECTED Final   Candida krusei NOT DETECTED NOT DETECTED Final   Candida parapsilosis NOT DETECTED NOT DETECTED Final   Candida tropicalis NOT DETECTED NOT DETECTED Final   Cryptococcus neoformans/gattii NOT DETECTED NOT DETECTED Final   CTX-M ESBL NOT DETECTED NOT DETECTED Final   Carbapenem resistance IMP NOT DETECTED NOT DETECTED Final   Carbapenem resistance KPC NOT DETECTED NOT DETECTED Final   Carbapenem resistance NDM NOT DETECTED NOT DETECTED Final   Carbapenem resist OXA 48 LIKE NOT DETECTED NOT DETECTED Final   Carbapenem resistance VIM NOT DETECTED NOT DETECTED Final    Comment: Performed at [facility], [address], [place], [place] [address]  Blood culture (routine x 2)     Status: None (Preliminary result)   Collection Time: 03/26/20  3:00 PM   Specimen: BLOOD RIGHT ARM  Result Value Ref Range Status   Specimen Description BLOOD RIGHT ARM  Final   Special Requests   Final    BOTTLES DRAWN AEROBIC AND ANAEROBIC Blood Culture adequate volume   Culture  Setup Time   Final    IN BOTH AEROBIC AND ANAEROBIC BOTTLES GRAM NEGATIVE RODS CRITICAL VALUE NOTED.  VALUE IS CONSISTENT WITH PREVIOUSLY REPORTED AND CALLED VALUE. Performed at [facility], [address] [place], [place] [address]    Culture GRAM NEGATIVE RODS  Final   Report Status PENDING  Incomplete  Respiratory Panel by PCR     Status: None   Collection Time: 03/26/20  6:49  PM   Specimen: Nasopharyngeal Swab; Respiratory  Result Value Ref Range Status   Adenovirus NOT DETECTED NOT DETECTED Final   Coronavirus 229E NOT DETECTED NOT DETECTED Final    Comment: (NOTE) The Coronavirus on the Respiratory Panel, DOES NOT test for the novel  Coronavirus (2019 nCoV)    Coronavirus HKU1 NOT DETECTED NOT DETECTED Final   Coronavirus NL63 NOT DETECTED NOT DETECTED Final   Coronavirus OC43 NOT DETECTED NOT DETECTED Final   Metapneumovirus NOT DETECTED NOT DETECTED Final   Rhinovirus / Enterovirus NOT DETECTED NOT DETECTED Final   Influenza A NOT DETECTED NOT DETECTED Final   Influenza B NOT DETECTED NOT DETECTED Final   Parainfluenza Virus 1 NOT DETECTED NOT DETECTED Final   Parainfluenza Virus 2 NOT DETECTED NOT DETECTED Final   Parainfluenza Virus 3 NOT DETECTED NOT DETECTED Final   Parainfluenza Virus 4 NOT DETECTED NOT DETECTED Final   Respiratory Syncytial Virus NOT DETECTED NOT DETECTED Final   Bordetella pertussis NOT DETECTED NOT DETECTED Final   Chlamydophila pneumoniae NOT DETECTED NOT DETECTED Final   Mycoplasma pneumoniae NOT DETECTED NOT DETECTED Final    Comment: Performed at [facility], [address], [place], [place] [address]  MRSA PCR Screening     Status: None   Collection Time: 03/27/20 12:14 AM   Specimen: Nasal Mucosa; Nasopharyngeal  Result Value Ref Range Status   MRSA by PCR NEGATIVE NEGATIVE Final    Comment:        The GeneXpert MRSA Assay (FDA approved for NASAL specimens only), is one component of a comprehensive MRSA colonization surveillance program. It is not intended to diagnose MRSA infection nor to guide or monitor treatment for MRSA infections. Performed at [facility], [address], [place], [place] [address]       Radiology Studies: DG Abd 1 View  Result Date: 03/27/2020 CLINICAL DATA:  Obstipation EXAM: ABDOMEN - 1 VIEW COMPARISON:  01/31/2019 FINDINGS: Increased stool in rectum and RIGHT  colon. Nonobstructive bowel gas pattern. No bowel dilatation or bowel wall thickening. Osseous demineralization with levoconvex scoliosis and degenerative changes of lumbar spine. Extensive atherosclerotic calcifications of aorta, iliac arteries, and visceral arteries. IMPRESSION: Increased stool in rectum and RIGHT colon. Electronically Signed   By: Ulyses Southward M.D.   On: 03/27/2020 11:14   CT Head Wo Contrast  Result Date: 03/26/2020 CLINICAL DATA:  Mental status change, unknown cause; altered mental status EXAM: CT HEAD WITHOUT CONTRAST TECHNIQUE: Contiguous axial images were obtained from the base of the skull through the vertex without intravenous contrast. COMPARISON:  CT head 02/01/2019. FINDINGS: Brain: Advanced cerebral atrophy. There is apparent abnormal hypodensity within the right pons, as well as right midbrain and inferior right thalamus (series 3, images 15-17) (series 5, image 38). This could reflect beam hardening artifact but is suspicious for acute infarction. Advanced ill-defined hypoattenuation within the cerebral white matter is nonspecific, but compatible chronic small vessel ischemic disease. Redemonstrated bilateral basal ganglia mineralization. There is no acute intracranial hemorrhage. No demarcated cortical infarct. No extra-axial fluid collection. No evidence of intracranial mass. No midline shift. Vascular: There is focal hyperdensity within the distal basilar artery (series 5, image 31) (series 3, image 15). Atherosclerotic calcifications. Skull: Normal. Negative for fracture or focal lesion. Sinuses/Orbits: Visualized orbits show no acute finding. Mild ethmoid sinus mucosal thickening. Moderate mucosal thickening and frothy secretions within a  small right maxillary sinus. Associated chronic reactive osteitis. No significant mastoid effusion. These results were called by telephone at the time of interpretation on 03/26/2020 at 8:09 pm to provider Dr. Bernette Mayers, who verbally  acknowledged these results. IMPRESSION: Apparent abnormal hypodensity within the right pons, as well as right midbrain and inferior right thalamus. This could reflect beam hardening artifact but is suspicious for acute infarct. Additionally, there is focal hyperdensity within the distal basilar artery suspicious for possible endoluminal thrombus. Consider CTA head/neck for further evaluation. Background advanced generalized cerebral atrophy and chronic small vessel ischemic disease. Chronic right maxillary sinusitis. Electronically Signed   By: Jackey Loge DO   On: 03/26/2020 20:10   DG Chest Portable 1 View  Result Date: 03/26/2020 CLINICAL DATA:  Hypoxia, altered mental status. EXAM: PORTABLE CHEST 1 VIEW COMPARISON:  None. FINDINGS: Bulging contour of the ascending aorta, concerning for aneurysm. Aortic atherosclerosis. Mild enlargement the cardiac silhouette. Postsurgical changes of CABG with median sternotomy. Linear right basilar opacities. No confluent consolidation. No visible pleural effusions or pneumothorax. No acute osseous abnormality. IMPRESSION: 1. Findings concerning for aneurysm of the ascending aorta. Recommend CTA chest to further evaluate. 2. Linear right basilar opacities, favor atelectasis. Findings discussed with Dr. Julieanne Manson Via telephone at 2:44 PM. Electronically Signed   By: Feliberto Harts MD   On: 03/26/2020 14:47   ECHOCARDIOGRAM COMPLETE  Result Date: 03/27/2020    ECHOCARDIOGRAM REPORT   Patient Name:   [facility] Date of Exam: 03/27/2020 Medical Rec #:  [id number]     Height:       62.0 in Accession #:    [id number]    Weight:       114.0 lb Date of Birth:  1940-12-31     BSA:          1.505 m Patient Age:    79 years      BP:           109/65 mmHg Patient Gender: F             HR:           98 bpm. Exam Location:  Inpatient Procedure: 2D Echo, Cardiac Doppler and Color Doppler Indications:    Stroke  History:        Patient has no prior history of Echocardiogram  examinations.                 CAD, Prior CABG; Risk Factors:Hypertension, Dyslipidemia and                 Former Smoker.                 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 Procedure Date: 2008.  Sonographer:    Ross Ludwig RDCS (AE) Referring Phys: [id number] ANASTASSIA DOUTOVA  Sonographer Comments: Patient unable to sniff for IVC collapse test. IMPRESSIONS  1. 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 V max 3.2 m/s, MG 21.5 mmHG, EOA 1.07 cm2, DI 0.34. The valve leaflets appear thickened with restricted leaflet motion. The jet is triangular and not rounded (AT <100 msec). Suspect there is mild to moderate stenosis of this prosthetic valve due to the reported age. The aortic valve has been repaired/replaced. Aortic valve regurgitation is not visualized. There is a bioprosthetic valve present in the aortic position. Procedure Date: 2008.  2. Asending aortic aneurysm up  to 63 mm. This was seen on recent CTA. Aortic dilatation noted.  3. Left ventricular ejection fraction, by estimation, is 60 to 65%. The left ventricle has normal function. The left ventricle has no regional wall motion abnormalities. Left ventricular diastolic parameters are consistent with Grade I diastolic dysfunction (impaired relaxation).  4. Right ventricular systolic function is normal. The right ventricular size is normal. There is normal pulmonary artery systolic pressure. The estimated right ventricular systolic pressure is 28.0 mmHg.  5. The mitral valve is degenerative. Trivial mitral valve regurgitation. No evidence of mitral stenosis.  6. Tricuspid valve regurgitation is mild to moderate.  7. The inferior vena cava is normal in size with greater than 50% respiratory variability, suggesting right atrial pressure of 3 mmHg. FINDINGS  Left Ventricle: Left ventricular ejection fraction, by estimation, is 60 to 65%. The left ventricle has normal function. The left ventricle has no  regional wall motion abnormalities. The left ventricular internal cavity size was small. There is no left ventricular hypertrophy. Left ventricular diastolic parameters are consistent with Grade I diastolic dysfunction (impaired relaxation). Normal left ventricular filling pressure. Right Ventricle: The right ventricular size is normal. No increase in right ventricular wall thickness. Right ventricular systolic function is normal. There is normal pulmonary artery systolic pressure. The tricuspid regurgitant velocity is 2.50 m/s, and  with an assumed right atrial pressure of 3 mmHg, the estimated right ventricular systolic pressure is 28.0 mmHg. Left Atrium: Left atrial size was normal in size. Right Atrium: Right atrial size was normal in size. Pericardium: Trivial pericardial effusion is present. Mitral Valve: The mitral valve is degenerative in appearance. Trivial mitral valve regurgitation. No evidence of mitral valve stenosis. Tricuspid Valve: The tricuspid valve is grossly normal. Tricuspid valve regurgitation is mild to moderate. No evidence of tricuspid stenosis. 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 V max 3.2 m/s, MG 21.5 mmHG, EOA 1.07 cm2, DI 0.34. The valve leaflets appear thickened with restricted leaflet motion. The jet is triangular and not rounded (AT <100 msec). Suspect there is mild to moderate stenosis of this prosthetic valve due to the reported age. The aortic valve has been repaired/replaced. Aortic valve regurgitation is not visualized. Aortic valve mean gradient measures 21.5 mmHg. Aortic valve peak gradient measures 41.9 mmHg. Aortic valve area, by VTI measures 1.08 cm. There is a bioprosthetic valve present in the aortic position. Procedure Date: 2008. Pulmonic Valve: The pulmonic valve was grossly normal. Pulmonic valve regurgitation is not visualized. No evidence of pulmonic stenosis. Aorta: Asending aortic aneurysm up to 63 mm. This was seen on recent  CTA. Aortic dilatation noted. Venous: The inferior vena cava is normal in size with greater than 50% respiratory variability, suggesting right atrial pressure of 3 mmHg. IAS/Shunts: The atrial septum is grossly normal.  LEFT VENTRICLE PLAX 2D LVIDd:         3.90 cm  Diastology LVIDs:         2.80 cm  LV e' medial:    6.74 cm/s LV PW:         1.09 cm  LV E/e' medial:  10.4 LV IVS:        1.10 cm  LV e' lateral:   11.60 cm/s LVOT diam:     2.00 cm  LV E/e' lateral: 6.1 LV SV:         68 LV SV Index:   45 LVOT Area:     3.14 cm  RIGHT VENTRICLE  IVC RV Basal diam:  3.20 cm    IVC diam: 1.80 cm RV S prime:     7.62 cm/s TAPSE (M-mode): 0.9 cm LEFT ATRIUM           Index       RIGHT ATRIUM           Index LA diam:      2.40 cm 1.59 cm/m  RA Area:     14.20 cm LA Vol (A2C): 22.6 ml 15.01 ml/m RA Volume:   35.60 ml  23.65 ml/m LA Vol (A4C): 19.8 ml 13.15 ml/m  AORTIC VALVE AV Area (Vmax):    1.00 cm AV Area (Vmean):   1.04 cm AV Area (VTI):     1.08 cm AV Vmax:           323.67 cm/s AV Vmean:          215.500 cm/s AV VTI:            0.633 m AV Peak Grad:      41.9 mmHg AV Mean Grad:      21.5 mmHg LVOT Vmax:         103.00 cm/s LVOT Vmean:        71.600 cm/s LVOT VTI:          0.217 m LVOT/AV VTI ratio: 0.34  AORTA Ao Root diam: 4.40 cm MITRAL VALVE               TRICUSPID VALVE MV Area (PHT): 2.43 cm    TR Peak grad:   25.0 mmHg MV Decel Time: 312 msec    TR Vmax:        250.00 cm/s MV E velocity: 70.30 cm/s MV A velocity: 58.30 cm/s  SHUNTS MV E/A ratio:  1.21        Systemic VTI:  0.22 m                            Systemic Diam: 2.00 cm Lennie Odor MD Electronically signed by Lennie Odor MD Signature Date/Time: 03/27/2020/10:59:31 AM    Final    CT Angio Chest/Abd/Pel for Dissection W and/or Wo Contrast  Result Date: 03/26/2020 CLINICAL DATA:  Widening of the mediastinum. EXAM: CT ANGIOGRAPHY CHEST, ABDOMEN AND PELVIS TECHNIQUE: Non-contrast CT of the chest was initially obtained.  Multidetector CT imaging through the chest, abdomen and pelvis was performed using the standard protocol during bolus administration of intravenous contrast. Multiplanar reconstructed images and MIPs were obtained and reviewed to evaluate the vascular anatomy. CONTRAST:  85mL OMNIPAQUE IOHEXOL 350 MG/ML SOLN COMPARISON:  None. FINDINGS: CTA CHEST FINDINGS Cardiovascular: There is an ascending thoracic aortic aneurysm measuring approximately 6.3 cm in diameter. There is no CT evidence for a dissection, however evaluation is limited by contrast timing. The sino-tubular junction measures up to approximately 5 cm in diameter. Atherosclerotic changes are noted of the thoracic aorta. The arch vessels appear to be grossly patent where visualized. The main pulmonary artery is dilated measuring 3.4 cm. Contrast bolus timing is suboptimal for the detection of acute pulmonary emboli. The patient is status post prior median sternotomy and aortic valve replacement. Mediastinum/Nodes: -- No mediastinal lymphadenopathy. -- No hilar lymphadenopathy. -- No axillary lymphadenopathy. -- No supraclavicular lymphadenopathy. -- Normal thyroid gland where visualized. -there is a moderate-sized hiatal hernia. Lungs/Pleura: There are moderate emphysematous changes bilaterally. The lung fields are suboptimally evaluated secondary to respiratory motion artifact. There is an airspace opacity  in the right lower lobe that appears to be enhancing and therefore is favored to represent an area of atelectasis. There is no pneumothorax or large pleural effusion. The trachea is unremarkable. Musculoskeletal: There are old healed left-sided rib fractures. No acute displaced fracture identified on today's study. Review of the MIP images confirms the above findings. CTA ABDOMEN AND PELVIS FINDINGS VASCULAR Aorta: There are atherosclerotic changes throughout the abdominal aorta without evidence for an aneurysm. Celiac: There is atherosclerotic disease at  the origin of the celiac axis resulting in mild stenosis. SMA: Patent without evidence of aneurysm, dissection, vasculitis or significant stenosis. 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 There is a moderate grade stenosis of the left renal artery. IMA: Patent without evidence of aneurysm, dissection, vasculitis or significant stenosis. Inflow: There is moderate narrowing throughout the bilateral common iliac arteries, right worse than left. There is moderate narrowing throughout the bilateral external iliac arteries, right worse than left. 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 There is complete occlusion of the proximal left SFA. Veins: No obvious venous abnormality within the limitations of this arterial phase study. Review of the MIP images confirms the above findings. NON-VASCULAR Hepatobiliary: The liver is normal. There is questionable mild gallbladder wall thickening, suboptimally evaluated secondary to streak artifact from the patient's arms.There is no biliary ductal dilation. Pancreas: Normal contours without ductal dilatation. No peripancreatic fluid collection. Spleen: Unremarkable. Adrenals/Urinary Tract: --Adrenal glands: Unremarkable. --Right kidney/ureter: No hydronephrosis or radiopaque kidney stones. --Left kidney/ureter: No hydronephrosis or radiopaque kidney stones. --Urinary bladder: Unremarkable. Stomach/Bowel: --Stomach/Duodenum: No hiatal hernia or other gastric abnormality. Normal duodenal course and caliber. --Small bowel: Unremarkable. --Colon: There is a large amount of stool at the level of the rectum. --Appendix: Normal. Lymphatic: --No retroperitoneal lymphadenopathy. --No mesenteric lymphadenopathy. --No pelvic or inguinal lymphadenopathy. Reproductive: Unremarkable Other: No ascites or free air. The abdominal wall is normal. Musculoskeletal. There is a chronic appearing compression fracture of L1 vertebral body. Multilevel degenerative  changes are noted throughout the visualized thoracolumbar spine. No evidence for an acute fracture on today's study. Review of the MIP images confirms the above findings. IMPRESSION: 1. Ascending thoracic aortic aneurysm measuring approximately 6.3 cm in diameter. There is no CT evidence for a dissection, however evaluation is limited by contrast timing. Surgical consultation is recommended. 2. Vascular disease involving the abdomen and bilateral lower extremities as detailed above. 3. Very large amount of stool at the level of the rectum. 4. Questionable mild gallbladder wall thickening. Correlation with laboratory studies is recommended. 5. Right basilar atelectasis. 6. Status post CABG and aortic valve replacement. Aortic Atherosclerosis (ICD10-I70.0) and Emphysema (ICD10-J43.9). Electronically Signed   By: Katherine Mantle M.D.   On: 03/26/2020 18:02   US Abdomen Limited RUQ (LIVER/GB)  Result Date: 03/26/2020 CLINICAL DATA:  Cholecystitis EXAM: ULTRASOUND ABDOMEN LIMITED RIGHT UPPER QUADRANT COMPARISON:  CT from the same day FINDINGS: Gallbladder: There is mild gallbladder wall thickening with the gallbladder wall measuring approximately 3 mm. There is sludge without evidence for cholelithiasis. The sonographic Eulah Pont sign is negative. Common bile duct: Diameter: 6 mm Liver: No focal lesion identified. Within normal limits in parenchymal echogenicity. Portal vein is patent on color Doppler imaging with normal direction of blood flow towards the liver. Other: None. IMPRESSION: Gallbladder sludge without sonographic evidence for acute cholecystitis. Electronically Signed   By: Katherine Mantle M.D.   On: 03/26/2020 20:21    Scheduled Meds: .  stroke: mapping our early stages of  recovery book   Does not apply Once  . acetaminophen  1,000 mg Oral Q8H  . albuterol  2 puff Inhalation Daily  . aspirin  81 mg Oral Daily  . divalproex  125 mg Oral Q12H  . escitalopram  20 mg Oral Daily  . feeding  supplement  237 mL Oral BID BM  . memantine  5 mg Oral QHS  . multivitamin with minerals  1 tablet Oral Daily  . senna  1 tablet Oral BID   Continuous Infusions: . cefTRIAXone (ROCEPHIN)  IV 2 g (03/27/20 1614)     LOS: 1 day   Time spent: 40 minutes  Lavin Petteway Estill Cotta, MD [facility]  If 7PM-7AM, please contact night-coverage [email or web address] 03/28/2020, 1:52 PM

## 2020-03-28 NOTE — Consult Note (Signed)
Consultation Note Date: 03/28/2020   Patient Name: Jasmine Carrillo  DOB: 18-Dec-1940  MRN: [id number]  Age / Sex: 79 y.o., female  PCP: Laurann Montana, MD Referring Physician: Ollen Bowl, MD  Reason for Consultation: Establishing goals of care  HPI/Patient Profile: 79 y.o. 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  Patient had an abnormal CT head with hypodensity within the right pons, right midbrain and inferior right thalamus that was suspicious for acute infarct, but could have been beam hardening artifact.  There was a second focal hyperdensity suspicious for possible endo luminal thrombus.  Family declined further stroke work up.   LFT were mildly elevated, abdominal U/S was negative for acute cholecystitis.  Abdominal xray showed constipation in the rectum and right colon.  Xray also shows a chronic L1 compression fracture.   SLP eval went well without signs of aspiration when the patient was carefully fed.  On PT eval patient was total Assist requiring max assist to sit EOB.  Clinical Assessment and Goals of Care:  I have reviewed medical records including EPIC notes, labs and imaging, received report from the care team, examined the patient and attempted to call her daughter Tejal Monroy 2x  to discuss diagnosis prognosis, GOC, EOL wishes, disposition and options.  I left detailed voice mail messages requesting a call back and a time to speak together.  On my assessment - [name] is lying comfortably in bed.  She appears awake and alert.  Breakfast tray is at bedside (she has not been fed yet).  I asked a few questions to which she responds yes & no, but I'm  uncertain of the accuracy of her responses.  She does not give more than a "yes" or a "no".  She allows me to examine her - heart is regular rate w/o murmur, breathing is unlabored and lungs sound clear.  Abdomen is soft, extremities are without edema.  She is unable to feed herself or perform any degree of ADLs or bed mobility.   Primary Decision Maker:  NEXT OF KIN daughter Jasmine Carrillo    SUMMARY OF RECOMMENDATIONS     Chronic constipation:  Added Senna BID to on-going bowel regimen.  Stop calcium supplementation with TUMs outpatient.  Patient needs feeding assistance.  Given advanced dementia - ability to speak < 6 intelligible different words, no ambulatory ability, etc... she is eligible for Hospice Care at LTC facility [facility] - if family is of the Hospice philosophy (interested primarily in her QOL, no aggressive treatment, no re-hospitalization unless she can not be kept comfortable in place).  Need to reach her daughter Jasmine Carrillo to determine if she would like for her mother to be supported by Hospice at [place].   Code Status/Advance Care Planning:  DNR   Symptom Management:   Added Senna.  Would consider long acting benzodiazepine such as low dose Klonopin for myoclonus if that has not had adverse effects in the past.  Additional Recommendations (Limitations, Scope, Preferences):  Minimize Medications   DC Tums as it contributes to constipation  DC Namenda as it is only beneficial in early to moderate dementia and at this point only adds adverse side effects)  DC Zocor as it is no longer beneficial.  Palliative Prophylaxis:   Delirium Protocol  Psycho-social/Spiritual:   Desire for further Chaplaincy support: Not discussed yet.  Prognosis:  Less than 6 months if aggressive treatment/hospitalization is not desired.    Discharge Planning: Return to LTC at [facility] with Palliative vs Hospice      Primary Diagnoses: Present on Admission: . SIRS  (systemic inflammatory response syndrome) (HCC) . Ascending aortic aneurysm (HCC) . Hyperlipidemia . Essential hypertension . Dementia (HCC) . Acute lower UTI . Obstipation . Acute metabolic encephalopathy . Acute respiratory failure with hypoxia (HCC) . Elevated LFTs . Abnormal head CT   I have reviewed the medical record, interviewed the patient and family, and examined the patient. The following aspects are pertinent.  Past Medical History:  Diagnosis Date  . Abnormal posture   . Anxiety   . Atherosclerotic heart disease of native coronary artery without angina pectoris   . Cognitive communication deficit   . Depression   . Falls frequently   . Fracture    right lower leg   . Hyperlipidemia   . Hypertension   . Repeated falls   . Unspecified dementia without behavioral disturbance (HCC)    Social History   Socioeconomic History  . Marital status: Widowed    Spouse name: Not on file  . Number of children: 4  . Years of education: 2  . Highest education level: Not on file  Occupational History  . Not on file  Tobacco Use  . Smoking status: Former Smoker    Quit date: 07/29/2006    Years since quitting: 13.6  . Smokeless tobacco: Never Used  Vaping Use  . Vaping Use: Never used  Substance and Sexual Activity  . Alcohol use: Not Currently    Comment: wine 1/2 twice a week   . Drug use: No  . Sexual activity: Not on file  Other Topics Concern  . Not on file  Social History Narrative   Lives at [facility] skilled nursing   Right handed    Caffeine 1/2 cup daily    Social Determinants of Health   Financial Resource Strain:   . Difficulty of Paying Living Expenses: Not on file  Food Insecurity:   . Worried About Programme researcher, broadcasting/film/video in the Last Year: Not on file  . Ran Out of Food in the Last Year: Not on file  Transportation Needs:   . Lack of Transportation (Medical): Not on file  . Lack of Transportation (Non-Medical): Not on file    Physical Activity:   . Days of Exercise per Week: Not on file  . Minutes of Exercise per Session: Not on file  Stress:   . Feeling of Stress : Not on file  Social Connections:   . Frequency of Communication with Friends and Family: Not on file  . Frequency of Social Gatherings with Friends and Family: Not on file  . Attends Religious Services: Not on file  . Active Member of Clubs or Organizations: Not on file  . Attends Banker Meetings: Not on file  . Marital Status:  Not on file   Family History  Problem Relation Age of Onset  . Hypertension Father     No Known Allergies    Vital Signs: BP 99/81 (BP Location: Right Arm)   Pulse 73   Temp 98.5 F (36.9 C) (Axillary)   Resp 16   Ht 5\' 2"  (1.575 m)   Wt 51.7 kg   SpO2 95%   BMI 20.85 kg/m  Pain Scale: 0-10   Pain Score: 0-No pain   SpO2: SpO2: 95 % O2 Device:SpO2: 95 % O2 Flow Rate: .O2 Flow Rate (L/min): 2 L/min    Palliative Assessment/Data: 30%     Time In: 9:00 Time Out: 10:00 Time Total: 60 min. Visit consisted of counseling and education dealing with the complex and emotionally intense issues surrounding the need for palliative care and symptom management in the setting of serious and potentially life-threatening illness. Greater than 50%  of this time was spent counseling and coordinating care related to the above assessment and plan.  Signed by: , PA-C Palliative Medicine  Please contact Palliative Medicine Team phone at [phone] for questions and concerns.  For individual provider: See [phone]

## 2020-03-29 DIAGNOSIS — R4182 Altered mental status, unspecified: Secondary | ICD-10-CM

## 2020-03-29 LAB — COMPREHENSIVE METABOLIC PANEL
ALT: 80 U/L — ABNORMAL HIGH (ref 0–44)
AST: 51 U/L — ABNORMAL HIGH (ref 15–41)
Albumin: 2.8 g/dL — ABNORMAL LOW (ref 3.5–5.0)
Alkaline Phosphatase: 154 U/L — ABNORMAL HIGH (ref 38–126)
Anion gap: 8 (ref 5–15)
BUN: 9 mg/dL (ref 8–23)
CO2: 23 mmol/L (ref 22–32)
Calcium: 8.3 mg/dL — ABNORMAL LOW (ref 8.9–10.3)
Chloride: 108 mmol/L (ref 98–111)
Creatinine, Ser: 0.39 mg/dL — ABNORMAL LOW (ref 0.44–1.00)
GFR, Estimated: >60 mL/min (ref >60)
Glucose, Bld: 97 mg/dL (ref 70–99)
Potassium: 3.9 mmol/L (ref 3.5–5.1)
Sodium: 139 mmol/L (ref 135–145)
Total Bilirubin: 1.1 mg/dL (ref 0.3–1.2)
Total Protein: 5.8 g/dL — ABNORMAL LOW (ref 6.5–8.1)

## 2020-03-29 LAB — CBC
HCT: 33.5 % — ABNORMAL LOW (ref 36.0–46.0)
Hemoglobin: 10.5 g/dL — ABNORMAL LOW (ref 12.0–15.0)
MCH: 27 pg (ref 26.0–34.0)
MCHC: 31.3 g/dL (ref 30.0–36.0)
MCV: 86.1 fL (ref 80.0–100.0)
Platelets: 197 10*3/uL (ref 150–400)
RBC: 3.89 MIL/uL (ref 3.87–5.11)
RDW: 16.4 % — ABNORMAL HIGH (ref 11.5–15.5)
WBC: 2.5 10*3/uL — ABNORMAL LOW (ref 4.0–10.5)
nRBC: 0 % (ref 0.0–0.2)

## 2020-03-29 LAB — CULTURE, BLOOD (ROUTINE X 2)
Special Requests: ADEQUATE
Special Requests: ADEQUATE

## 2020-03-29 MED ORDER — AMOXICILLIN 500 MG PO CAPS
500.0000 mg | ORAL_CAPSULE | Freq: Two times a day (BID) | ORAL | Status: DC
  Administered 2020-03-29 (×2): 500 mg via ORAL
  Filled 2020-03-29 (×5): qty 1

## 2020-03-29 NOTE — Progress Notes (Signed)
Referral received to assist pt with hospice. D/C plan is for pt to return to [place] with hospice. Met with pt's daughter [name]) to discuss preference for a hospice agency. She agrees to use [facility]. Notified Chryslin with [facility] of referral.

## 2020-03-29 NOTE — Progress Notes (Signed)
Daily Progress Note   Patient Name: Jasmine Carrillo       Date: 03/29/2020 DOB: [birth year]  Age: 79 y.o. MRN#: [id number] Attending Physician: Jasmine Bowl, MD Primary Care Physician: Jasmine Montana, MD Admit Date: 03/26/2020  Reason for Consultation/Follow-up: To discuss complex medical decision making related to patient's goals of care  Subjective: I spoke with Jasmine Carrillo, HCPOA on the telephone for approximately 30 minutes this morning.  Jasmine Carrillo provided insight into who her mother was prior to dementia.  Her mother always worked.  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  She also was very active in the yard and had a huge [employer] which she would work in for hours at night.  She has a joking, easygoing personality.  She is a beautiful Dispensing optician who loves all types of music especially each music.  Jasmine Carrillo shares that her mother lost 2 children, her husband and a brother.  Jasmine Carrillo feels that part of the patient's dementia may come from the trauma of although she has lost.  Jasmine Carrillo has been with her mother as the dementia has progressed.  In the past, her mother told her that she feels locked into a mind that she is no longer able to control.  Jasmine Carrillo comments that just being alive is not necessarily living.  She questioned medications that her mother is currently taking that she feels may not be helpful to her any longer.  We discussed discontinuing Namenda, Zocor, Tums.  We talked about treatment for the myoclonus.  In the past her neurologist Dr. Anne Carrillo has prescribed increased doses of Depakote which overly sedated the patient.  Jasmine Carrillo would prefer to avoid oversedation.  We discussed engaging Hospice to assist in the care of Jasmine Carrillo.  Jasmine Carrillo is in agreement with hospice  philosophy of avoiding aggressive life prolonging interventions and recurrent hospitalizations.  She would like to engage hospice at long-term care going forward to help support her mother.  At the end of our conversation Jasmine Carrillo mentioned that her mother has a roommate at [employer].  This roommate is likely developmentally delayed and while she does not speak she frequently yells out during the day.  When she yells out Jasmine Carrillo is frightened and agitated.  Jasmine Carrillo is approaching her final stage in life and hospice care Jasmine Carrillo  feels it is important to give her peace and comfort in a private room.  She asks for social work support in attempting to place her mother in a private room at [place].    Assessment: Pleasant, 79 year old female with end-stage dementia.  Has likely incurred an acute CVA in the right side of her brain.  Further she was suffering with UTI which has now been treated and severe constipation.  She is hospice eligible and her long-term care facility.  Her daughter is in agreement with engaging hospice services.  Patient Profile/HPI:  79 y.o. 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  Patient had an abnormal CT head with hypodensity within the right pons, right midbrain and inferior right thalamus that was suspicious for acute infarct, but could have been beam hardening artifact.  There was a second focal hyperdensity suspicious for possible endo luminal thrombus.  Family declined further stroke work up.   LFT were mildly elevated, abdominal U/S was negative for acute cholecystitis.  Abdominal xray showed constipation in the rectum and right colon.  Xray also shows a chronic L1 compression fracture.   SLP eval went well without signs of  aspiration when the patient was carefully fed.  On PT eval patient was total Assist requiring max assist to sit EOB.    Length of Stay: 2   Vital Signs: BP (!) 153/82 (BP Location: Left Arm)   Pulse 75   Temp 98 F (36.7 C) (Oral)   Resp 14   Ht 5\' 2"  (1.575 m)   Wt 51.7 kg   SpO2 97%   BMI 20.85 kg/m  SpO2: SpO2: 97 % O2 Device: O2 Device: Room Air O2 Flow Rate: O2 Flow Rate (L/min): 2 L/min       Palliative Assessment/Data: 30%     Palliative Care Plan    Recommendations/Plan:  Minimize medications as has already been done by primary tending team  Plan to engage hospice to provide support at long-term care facility  Return to [facility] with hospice when medically appropriate  Code Status:  DNR  Prognosis:   < 6 months -given family's desire for comfort focus, acute stroke, bedbound status, poor p.o. intake, and end-stage dementia.  Discharge Planning:  Skilled Nursing Facility with Hospice for long-term care  Care plan was discussed with daughter.  Thank you for allowing the Palliative Medicine Team to assist in the care of this patient.  Total time spent: 45 minutes    Greater than 50%  of this time was spent counseling and coordinating care related to the above assessment and plan.  [facility], PA-C Palliative Medicine  Please contact Palliative MedicineTeam phone at [phone] for questions and concerns between 7 am - 7 pm.   Please see AMION for individual provider pager numbers.

## 2020-03-29 NOTE — Plan of Care (Signed)
  Problem: Clinical Measurements: Goal: Cardiovascular complication will be avoided Outcome: Progressing   Problem: Coping: Goal: Level of anxiety will decrease Outcome: Progressing   

## 2020-03-29 NOTE — Progress Notes (Signed)
Civil engineer, contracting [facility])  Received request from [facility] for hospice services at home after discharge.  Chart and pt information under review by [facility] physician.  Hospice eligibility pending at this time.  Hospital liaison spoke with [name], pt's daughter, to initiate education related to hospice philosophy and services and to answer any questions at this time.  [name]  verbalized understanding of information given.      Pease send signed and completed DNR home with pt/family.  Please provide prescriptions at discharge as needed to ensure ongoing symptom management until pt can be admitted onto hospice services.    DME needs discussed.  Family denies any DME needs at this time. Address has been verified and is correct in the chart.  [facility] information and contact numbers given to [facility].  Above information shared with Ashby Dawes Manager.  Please call with any questions or concerns.  Thank you for the opportunity to participate in this pt's care.  Gillian Scarce, BSN, RN [employer] [phone] (24h on call)

## 2020-03-29 NOTE — Progress Notes (Signed)
PROGRESS NOTE    Jasmine Carrillo  ZOX:[id number] DOB: Jul 01, 1940 DOA: 03/26/2020 PCP: Laurann Montana, MD   Brief Narrative:  Patient is a 79 year old female with past medical history of dementia, hypertension, hyperlipidemia, coronary artery disease status post CABG, chronic constipation, depression, recurrent falls presented with altered mental status.  ED course: Patient was noted to be tachycardic and febrile or fever of 100.9, CMP shows elevated LFTs, chest x-ray shows widened mediastinum.  CTA chest showed 6.3 cm ascending aortic aneurysm.  UA showed bacteria, imaging showed obstipation with some gallbladder wall abnormality.  CT scan showed no evidence of cholecystitis.  She was also placed on 3 L of oxygen due to hypoxia.  Admitted for acute metabolic encephalopathy.    Assessment & Plan:  E coli Bacteremia: -Patient presented with tachycardia, tachypnea, hypoxia and fever. -Blood culture from 10/28 came back positive for E. coli.  Reviewed susceptibilities -On IV Rocephin-we will switch to amoxicillin 500 mg every 12 hours for 7 days.  Repeat culture is negative so far. -Patient remained afebrile overnight.  Other vital signs within normal limits.  Acute metabolic encephalopathy: improving -In the setting of E. coli bacteremia with underlying severe Alzheimer's dementia & dehydration  -Continue IV fluids and antibiotics -No leukocytosis, lactic acid, ammonia, TSH, lipase, troponin: WNL,  -Consult PT/OT/SLP -On fall/aspiration precautions -Plan is SNF with hospice for long-term care.  Acute hypoxemic respiratory failure: Resolved. -Patient was requiring 3 L of oxygen via nasal cannula upon admission.  Chest x-ray and CT chest negative for infection.Marland Kitchen  COVID-19 negative. -Now on RA  Abnormal CT head: -Suspicious for acute infarct. -Patient's family refused further aggressive intervention/imaging at this time. -Continue aspirin.  Hold statin due to elevated liver  enzymes. -Consult PT/OT/SLP  Elevated liver enzymes: Improving -Ammonia: WNL.  Right upper quadrant ultrasound shows no cholecystitis. -Hepatitis panel: Negative -Monitor liver enzymes.  Coronary artery disease status post CABG in 2008 -Continue aspirin, hold statin due to elevated liver enzymes.  Hypertension: Blood pressure soft upon admission. -This morning her blood pressure noted to be 153/82.  Discontinue IV fluids.  Continue to hold Coreg for now.  Hyperlipidemia: -Reviewed lipid panel.  Hold statin due to elevated liver enzymes.  CK level: WNL  Hypomagnesemia: Replenished.  Repeat magnesium: WNL  Constipation: X-ray abdomen showed increased stool in rectum and right colon -Enema was given -Continue senna  Ascending aortic aneurysm: Noted on CTA chest. -Family does not want further intervention.  Low hemoglobin: H&H dropped from 13.3/39.0-9.7/31.4 in 1 day.  Likely dilutional.  Today H&H is 10.5/33.5-likely new baseline for her.  No signs of active bleeding.  Continue to monitor.  Transfuse as needed. -B12: 1206-elevated, low iron, normal ferritin and folate level  Dementia/depression: DC Namenda and Zocor  DVT prophylaxis: Lovenox/SCD Code Status: DNR-confirmed with patient's daughter Family Communication: None present at bedside.  Plan of care discussed with patient in length and he verbalized understanding and agreed with it.  Called patient's daughter [name]-discussed plan of care.   Disposition Plan: Likely SNF with hospice likely tomorrow  Consultants:  Palliative care  Procedures:  CT head CT chest  Antimicrobials:   Rocephin  Status is: Inpatient  Dispo: The patient is from: SNF              Anticipated d/c is to: SNF with hospice              Anticipated d/c date is: 03/30/2020  Patient currently not a stable for the discharge.   Subjective: Patient seen and examined.  Resting comfortably on the bed.  On room air.  Alert and awake.   Not following commands.  Blinking her eyes.  Not in acute distress.  Objective: Vitals:   03/28/20 0733 03/28/20 1300 03/28/20 2127 03/29/20 0434  BP:  (!) 149/81 124/71 (!) 153/82  Pulse:  81 75 75  Resp:  16 18 14   Temp:  99 F (37.2 C) 98.2 F (36.8 C) 98 F (36.7 C)  TempSrc:  Oral Oral Oral  SpO2: 95% 97% 96% 97%  Weight:      Height:        Intake/Output Summary (Last 24 hours) at 03/29/2020 1355 Last data filed at 03/29/2020 1043 Gross per 24 hour  Intake 1260.3 ml  Output 1450 ml  Net -189.7 ml   Filed Weights   03/26/20 1434 03/26/20 2116  Weight: 61.7 kg 51.7 kg    Examination:  General exam: Appears calm and comfortable, on room air, thin and lean Respiratory system: Clear to auscultation. Respiratory effort normal. Cardiovascular system: S1 & S2 heard, RRR. No JVD, murmurs, rubs, gallops or clicks. No pedal edema. Gastrointestinal system: Abdomen is nondistended, soft and nontender. No organomegaly or masses felt. Normal bowel sounds heard. Central nervous system: Awake but not following commands.  Mute. Skin: No rashes, lesions or ulcers.     Data Reviewed: I have personally reviewed following labs and imaging studies  CBC: Recent Labs  Lab 03/26/20 1416 03/26/20 1517 03/27/20 0413 03/28/20 0120 03/29/20 0458  WBC 10.2  --  4.8 3.0* 2.5*  NEUTROABS 9.4*  --  4.0  --   --   HGB 12.6 13.3 9.7* 10.1* 10.5*  HCT 40.9 39.0 31.4* 32.0* 33.5*  MCV 87.4  --  85.3 87.2 86.1  PLT 195  --  153 151 197   Basic Metabolic Panel: Recent Labs  Lab 03/26/20 1416 03/26/20 1517 03/26/20 2246 03/27/20 0413 03/28/20 0120 03/29/20 0458  NA 143 144  --  144 143 139  K 4.2 4.1  --  3.5 3.8 3.9  CL 106  --   --  112* 111 108  CO2 26  --   --  24 25 23   GLUCOSE 117*  --   --  96 92 97  BUN 25*  --   --  20 21 9   CREATININE 0.66  --   --  0.47 0.39* 0.39*  CALCIUM 9.2  --   --  8.3* 8.3* 8.3*  MG  --   --  1.3* 1.9  --   --   PHOS  --   --   --  2.6  --    --    GFR: Estimated Creatinine Clearance: 45.1 mL/min (A) (by C-G formula based on SCr of 0.39 mg/dL (L)). Liver Function Tests: Recent Labs  Lab 03/26/20 1416 03/27/20 0413 03/28/20 0120 03/29/20 0458  AST 185* 96* 66* 51*  ALT 184* 129* 101* 80*  ALKPHOS 192* 137* 143* 154*  BILITOT 0.8 0.6 0.4 1.1  PROT 6.9 5.5* 5.3* 5.8*  ALBUMIN 3.4* 2.6* 2.6* 2.8*   Recent Labs  Lab 03/26/20 1416  LIPASE 44   Recent Labs  Lab 03/26/20 1417  AMMONIA 11   Coagulation Profile: Recent Labs  Lab 03/28/20 0120  INR 1.0   Cardiac Enzymes: Recent Labs  Lab 03/26/20 2246  CKTOTAL 176   BNP (last 3 results) No results for input(s):  PROBNP in the last 8760 hours. HbA1C: Recent Labs    03/27/20 0413  HGBA1C 5.8*   CBG: No results for input(s): GLUCAP in the last 168 hours. Lipid Profile: Recent Labs    03/27/20 0413  CHOL 107  HDL 47  LDLCALC 50  TRIG 52  CHOLHDL 2.3   Thyroid Function Tests: Recent Labs    03/26/20 1417  TSH 0.862   Anemia Panel: Recent Labs    03/27/20 1531  VITAMINB12 1,206*  FOLATE 27.5  FERRITIN 65  TIBC 333  IRON 12*   Sepsis Labs: Recent Labs  Lab 03/26/20 1416 03/26/20 1816 03/26/20 2246  PROCALCITON  --   --  0.55  LATICACIDVEN 1.3 1.2  --     Recent Results (from the past 240 hour(s))  Respiratory Panel by RT PCR (Flu A&B, Covid) - Nasopharyngeal Swab     Status: None   Collection Time: 03/26/20  2:45 PM   Specimen: Nasopharyngeal Swab  Result Value Ref Range Status   SARS Coronavirus 2 by RT PCR NEGATIVE NEGATIVE Final    Comment: (NOTE) SARS-CoV-2 target nucleic acids are NOT DETECTED.  The SARS-CoV-2 RNA is generally detectable in upper respiratoy specimens during the acute phase of infection. The lowest concentration of SARS-CoV-2 viral copies this assay can detect is 131 copies/mL. A negative result does not preclude SARS-Cov-2 infection and should not be used as the sole basis for treatment or other  patient management decisions. A negative result may occur with  improper specimen collection/handling, submission of specimen other than nasopharyngeal swab, presence of viral mutation(s) within the areas targeted by this assay, and inadequate number of viral copies (<131 copies/mL). A negative result must be combined with clinical observations, patient history, and epidemiological information. The expected result is Negative.  Fact Sheet for Patients:  https://www.moore.com/  Fact Sheet for Healthcare Providers:  https://www.young.biz/  This test is no t yet approved or cleared by the [place] FDA and  has been authorized for detection and/or diagnosis of SARS-CoV-2 by FDA under an Emergency Use Authorization (EUA). This EUA will remain  in effect (meaning this test can be used) for the duration of the COVID-19 declaration under Section 564(b)(1) of the Act, 21 U.S.C. section 360bbb-3(b)(1), unless the authorization is terminated or revoked sooner.     Influenza A by PCR NEGATIVE NEGATIVE Final   Influenza B by PCR NEGATIVE NEGATIVE Final    Comment: (NOTE) The Xpert Xpress SARS-CoV-2/FLU/RSV assay is intended as an aid in  the diagnosis of influenza from Nasopharyngeal swab specimens and  should not be used as a sole basis for treatment. Nasal washings and  aspirates are unacceptable for Xpert Xpress SARS-CoV-2/FLU/RSV  testing.  Fact Sheet for Patients: https://www.moore.com/  Fact Sheet for Healthcare Providers: https://www.young.biz/  This test is not yet approved or cleared by the [place] FDA and  has been authorized for detection and/or diagnosis of SARS-CoV-2 by  FDA under an Emergency Use Authorization (EUA). This EUA will remain  in effect (meaning this test can be used) for the duration of the  Covid-19 declaration under Section 564(b)(1) of the Act, 21  U.S.C. section  360bbb-3(b)(1), unless the authorization is  terminated or revoked. Performed at [facility], [address] [place], [place] [address]   Blood culture (routine x 2)     Status: Abnormal   Collection Time: 03/26/20  2:46 PM   Specimen: BLOOD  Result Value Ref Range Status   Specimen Description BLOOD  LEFT ANTECUBITAL  Final   Special Requests   Final    BOTTLES DRAWN AEROBIC AND ANAEROBIC Blood Culture adequate volume   Culture  Setup Time   Final    IN BOTH AEROBIC AND ANAEROBIC BOTTLES GRAM NEGATIVE RODS Organism ID to follow CRITICAL RESULT CALLED TO, READ BACK BY AND VERIFIED WITH: K AMEND [facility] 03/27/20 0557 JDW Performed at [facility], [address] [place], [place] [address]    Culture ESCHERICHIA COLI (A)  Final   Report Status 03/29/2020 FINAL  Final   Organism ID, Bacteria ESCHERICHIA COLI  Final      Susceptibility   Escherichia coli - MIC*    AMPICILLIN 4 SENSITIVE Sensitive     CEFAZOLIN <=4 SENSITIVE Sensitive     CEFEPIME <=0.12 SENSITIVE Sensitive     CEFTAZIDIME <=1 SENSITIVE Sensitive     CEFTRIAXONE <=0.25 SENSITIVE Sensitive     CIPROFLOXACIN <=0.25 SENSITIVE Sensitive     GENTAMICIN <=1 SENSITIVE Sensitive     IMIPENEM <=0.25 SENSITIVE Sensitive     TRIMETH/SULFA >=320 RESISTANT Resistant     AMPICILLIN/SULBACTAM <=2 SENSITIVE Sensitive     PIP/TAZO <=4 SENSITIVE Sensitive     * ESCHERICHIA COLI  Blood Culture ID Panel (Reflexed)     Status: Abnormal   Collection Time: 03/26/20  2:46 PM  Result Value Ref Range Status   Enterococcus faecalis NOT DETECTED NOT DETECTED Final   Enterococcus Faecium NOT DETECTED NOT DETECTED Final   Listeria monocytogenes NOT DETECTED NOT DETECTED Final   Staphylococcus species NOT DETECTED NOT DETECTED Final   Staphylococcus aureus (BCID) NOT DETECTED NOT DETECTED Final   Staphylococcus epidermidis NOT DETECTED NOT DETECTED Final   Staphylococcus lugdunensis NOT DETECTED NOT DETECTED Final    Streptococcus species NOT DETECTED NOT DETECTED Final   Streptococcus agalactiae NOT DETECTED NOT DETECTED Final   Streptococcus pneumoniae NOT DETECTED NOT DETECTED Final   Streptococcus pyogenes NOT DETECTED NOT DETECTED Final   A.calcoaceticus-baumannii NOT DETECTED NOT DETECTED Final   Bacteroides fragilis NOT DETECTED NOT DETECTED Final   Enterobacterales DETECTED (A) NOT DETECTED Final    Comment: Enterobacterales represent a large order of gram negative bacteria, not a single organism. CRITICAL RESULT CALLED TO, READ BACK BY AND VERIFIED WITH: K AMEND PHARMD 03/27/20 0557 JDW    Enterobacter cloacae complex NOT DETECTED NOT DETECTED Final   Escherichia coli DETECTED (A) NOT DETECTED Final    Comment: CRITICAL RESULT CALLED TO, READ BACK BY AND VERIFIED WITH: K AMEND PHARMD 03/27/20 0557 JDW    Klebsiella aerogenes NOT DETECTED NOT DETECTED Final   Klebsiella oxytoca NOT DETECTED NOT DETECTED Final   Klebsiella pneumoniae NOT DETECTED NOT DETECTED Final   Proteus species NOT DETECTED NOT DETECTED Final   Salmonella species NOT DETECTED NOT DETECTED Final   Serratia marcescens NOT DETECTED NOT DETECTED Final   Haemophilus influenzae NOT DETECTED NOT DETECTED Final   Neisseria meningitidis NOT DETECTED NOT DETECTED Final   Pseudomonas aeruginosa NOT DETECTED NOT DETECTED Final   Stenotrophomonas maltophilia NOT DETECTED NOT DETECTED Final   Candida albicans NOT DETECTED NOT DETECTED Final   Candida auris NOT DETECTED NOT DETECTED Final   Candida glabrata NOT DETECTED NOT DETECTED Final   Candida krusei NOT DETECTED NOT DETECTED Final   Candida parapsilosis NOT DETECTED NOT DETECTED Final   Candida tropicalis NOT DETECTED NOT DETECTED Final   Cryptococcus neoformans/gattii NOT DETECTED NOT DETECTED Final   CTX-M ESBL NOT DETECTED NOT DETECTED Final   Carbapenem  resistance IMP NOT DETECTED NOT DETECTED Final   Carbapenem resistance KPC NOT DETECTED NOT DETECTED Final    Carbapenem resistance NDM NOT DETECTED NOT DETECTED Final   Carbapenem resist OXA 48 LIKE NOT DETECTED NOT DETECTED Final   Carbapenem resistance VIM NOT DETECTED NOT DETECTED Final    Comment: Performed at [facility], [address], [place], [place] [address]  Blood culture (routine x 2)     Status: Abnormal   Collection Time: 03/26/20  3:00 PM   Specimen: BLOOD RIGHT ARM  Result Value Ref Range Status   Specimen Description BLOOD RIGHT ARM  Final   Special Requests   Final    BOTTLES DRAWN AEROBIC AND ANAEROBIC Blood Culture adequate volume   Culture  Setup Time   Final    IN BOTH AEROBIC AND ANAEROBIC BOTTLES GRAM NEGATIVE RODS CRITICAL VALUE NOTED.  VALUE IS CONSISTENT WITH PREVIOUSLY REPORTED AND CALLED VALUE.    Culture (A)  Final    ESCHERICHIA COLI SUSCEPTIBILITIES PERFORMED ON PREVIOUS CULTURE WITHIN THE LAST 5 DAYS. Performed at [facility], [address] [place], [place] [address]    Report Status 03/29/2020 FINAL  Final  Respiratory Panel by PCR     Status: None   Collection Time: 03/26/20  6:49 PM   Specimen: Nasopharyngeal Swab; Respiratory  Result Value Ref Range Status   Adenovirus NOT DETECTED NOT DETECTED Final   Coronavirus 229E NOT DETECTED NOT DETECTED Final    Comment: (NOTE) The Coronavirus on the Respiratory Panel, DOES NOT test for the novel  Coronavirus (2019 nCoV)    Coronavirus HKU1 NOT DETECTED NOT DETECTED Final   Coronavirus NL63 NOT DETECTED NOT DETECTED Final   Coronavirus OC43 NOT DETECTED NOT DETECTED Final   Metapneumovirus NOT DETECTED NOT DETECTED Final   Rhinovirus / Enterovirus NOT DETECTED NOT DETECTED Final   Influenza A NOT DETECTED NOT DETECTED Final   Influenza B NOT DETECTED NOT DETECTED Final   Parainfluenza Virus 1 NOT DETECTED NOT DETECTED Final   Parainfluenza Virus 2 NOT DETECTED NOT DETECTED Final   Parainfluenza Virus 3 NOT DETECTED NOT DETECTED Final   Parainfluenza Virus 4 NOT DETECTED NOT DETECTED Final    Respiratory Syncytial Virus NOT DETECTED NOT DETECTED Final   Bordetella pertussis NOT DETECTED NOT DETECTED Final   Chlamydophila pneumoniae NOT DETECTED NOT DETECTED Final   Mycoplasma pneumoniae NOT DETECTED NOT DETECTED Final    Comment: Performed at [facility], [address], [place], [place] [address]  MRSA PCR Screening     Status: None   Collection Time: 03/27/20 12:14 AM   Specimen: Nasal Mucosa; Nasopharyngeal  Result Value Ref Range Status   MRSA by PCR NEGATIVE NEGATIVE Final    Comment:        The GeneXpert MRSA Assay (FDA approved for NASAL specimens only), is one component of a comprehensive MRSA colonization surveillance program. It is not intended to diagnose MRSA infection nor to guide or monitor treatment for MRSA infections. Performed at [facility], [address], [place], [place] [address]   Culture, blood (routine x 2)     Status: None (Preliminary result)   Collection Time: 03/28/20  8:30 PM   Specimen: BLOOD  Result Value Ref Range Status   Specimen Description BLOOD LEFT ANTECUBITAL  Final   Special Requests   Final    BOTTLES DRAWN AEROBIC ONLY Blood Culture results may not be optimal due to an inadequate volume of  blood received in culture bottles   Culture   Final    NO GROWTH < 24 HOURS Performed at [facility], [address] [place], [place] [address]    Report Status PENDING  Incomplete  Culture, blood (routine x 2)     Status: None (Preliminary result)   Collection Time: 03/29/20  5:05 AM   Specimen: BLOOD  Result Value Ref Range Status   Specimen Description BLOOD LEFT ANTECUBITAL  Final   Special Requests   Final    BOTTLES DRAWN AEROBIC ONLY Blood Culture results may not be optimal due to an inadequate volume of blood received in culture bottles   Culture   Final    NO GROWTH < 12 HOURS Performed at [facility], [address], [place], [place] [address]    Report Status PENDING  Incomplete       Radiology Studies: No results found.  Scheduled Meds: .  stroke: mapping our early stages of recovery book   Does not apply Once  . acetaminophen  1,000 mg Oral Q8H  . albuterol  2 puff Inhalation Daily  . amoxicillin  500 mg Oral Q12H  . aspirin  81 mg Oral Daily  . divalproex  125 mg Oral Q12H  . enoxaparin (LOVENOX) injection  40 mg Subcutaneous Q24H  . feeding supplement  237 mL Oral BID BM  . multivitamin with minerals  1 tablet Oral Daily  . senna  1 tablet Oral BID   Continuous Infusions:    LOS: 2 days   Time spent: 35 minutes  Channing Yeager Estill Cotta, MD [facility]  If 7PM-7AM, please contact night-coverage [email or web address] 03/29/2020, 1:55 PM

## 2020-03-29 NOTE — Plan of Care (Signed)
  Problem: Clinical Measurements: Goal: Diagnostic test results will improve Outcome: Progressing Goal: Respiratory complications will improve Outcome: Progressing Goal: Cardiovascular complication will be avoided Outcome: Progressing   Problem: Coping: Goal: Level of anxiety will decrease Outcome: Progressing   Problem: Pain Managment: Goal: General experience of comfort will improve Outcome: Progressing

## 2020-03-29 NOTE — TOC Progression Note (Signed)
Transition of Care [facility]) - Progression Note    Patient Details  Name: Jasmine Carrillo MRN: [id number] Date of Birth: 1940/10/02  Transition [facility]) CM/SW Contact  Patrice Paradise, LCSW Phone Number: [phone] 03/29/2020, 2:50 PM  Clinical Narrative:     CSW spoke to patient's daughter [name] in regards to questions she had about having a private room at [facility] due to patient's current roommate.  CSW spoke with Fritzi Mandes at [facility] and she stated that she would follow up with [name] and see if an arrangement could be made.Kirsten also confirmed that patient would not need another COVID test.  TOC team will continue to assist with discharge planning needs.  Expected Discharge Plan: Acute to Acute Transfer Barriers to Discharge: Continued Medical Work up  Expected Discharge Plan and Services Expected Discharge Plan: Acute to Acute Transfer       Living arrangements for the past 2 months: Skilled Nursing Facility                                       Social Determinants of Health (SDOH) Interventions    Readmission Risk Interventions No flowsheet data found.

## 2020-03-30 DIAGNOSIS — Z515 Encounter for palliative care: Secondary | ICD-10-CM

## 2020-03-30 LAB — COMPREHENSIVE METABOLIC PANEL
ALT: 61 U/L — ABNORMAL HIGH (ref 0–44)
AST: 32 U/L (ref 15–41)
Albumin: 2.6 g/dL — ABNORMAL LOW (ref 3.5–5.0)
Alkaline Phosphatase: 145 U/L — ABNORMAL HIGH (ref 38–126)
Anion gap: 8 (ref 5–15)
BUN: <5 mg/dL — ABNORMAL LOW (ref 8–23)
CO2: 23 mmol/L (ref 22–32)
Calcium: 8.2 mg/dL — ABNORMAL LOW (ref 8.9–10.3)
Chloride: 109 mmol/L (ref 98–111)
Creatinine, Ser: 0.36 mg/dL — ABNORMAL LOW (ref 0.44–1.00)
GFR, Estimated: >60 mL/min (ref >60)
Glucose, Bld: 101 mg/dL — ABNORMAL HIGH (ref 70–99)
Potassium: 3.2 mmol/L — ABNORMAL LOW (ref 3.5–5.1)
Sodium: 140 mmol/L (ref 135–145)
Total Bilirubin: 0.3 mg/dL (ref 0.3–1.2)
Total Protein: 5.5 g/dL — ABNORMAL LOW (ref 6.5–8.1)

## 2020-03-30 LAB — CBC
HCT: 34.1 % — ABNORMAL LOW (ref 36.0–46.0)
Hemoglobin: 11 g/dL — ABNORMAL LOW (ref 12.0–15.0)
MCH: 27.4 pg (ref 26.0–34.0)
MCHC: 32.3 g/dL (ref 30.0–36.0)
MCV: 84.8 fL (ref 80.0–100.0)
Platelets: 212 10*3/uL (ref 150–400)
RBC: 4.02 MIL/uL (ref 3.87–5.11)
RDW: 16.1 % — ABNORMAL HIGH (ref 11.5–15.5)
WBC: 2.1 10*3/uL — ABNORMAL LOW (ref 4.0–10.5)
nRBC: 0 % (ref 0.0–0.2)

## 2020-03-30 LAB — MAGNESIUM: Magnesium: 1.9 mg/dL (ref 1.7–2.4)

## 2020-03-30 MED ORDER — POTASSIUM CHLORIDE 20 MEQ PO PACK: 40.0000 meq | PACK | Freq: Once | ORAL | Status: DC

## 2020-03-30 MED ORDER — SENNA 8.6 MG PO TABS: 1.0000 | ORAL_TABLET | Freq: Two times a day (BID) | ORAL | 0 refills | Status: AC

## 2020-03-30 MED ORDER — PROSOURCE PLUS PO LIQD
30.0000 mL | Freq: Three times a day (TID) | ORAL | Status: DC
  Filled 2020-03-30: qty 30

## 2020-03-30 MED ORDER — AMOXICILLIN 500 MG PO CAPS: 500.0000 mg | ORAL_CAPSULE | Freq: Two times a day (BID) | ORAL | 0 refills | Status: AC

## 2020-03-30 NOTE — TOC Transition Note (Signed)
Transition of Care [facility]) - CM/SW Discharge Note   Patient Details  Name: [name] MRN: [id number] Date of Birth: 26-Jan-1941  [facility]) CM/SW Contact:  Erin Sons, LCSW Phone Number: 03/30/2020, 12:35 PM   Clinical Narrative:    Patient will DC to: [facility] Anticipated DC date: 03/30/20 Family notified: Ladell, Bey (Daughter)  [phone] [facility]) Transport by: Sharin Mons   Per MD patient ready for DC to [facility]. RN, patient, patient's family, and facility notified of DC. Discharge Summary and FL2 sent to facility. RN to call report prior to discharge [phone] Room 1103B). DC packet on chart. Ambulance transport requested for patient.   CSW will sign off for now as social work intervention is no longer needed. Please consult [place] again if new needs arise.    Final next level of care: Skilled Nursing Facility Barriers to Discharge: No Barriers Identified   Patient Goals and CMS Choice Patient states their goals for this hospitalization and ongoing recovery are:: Daughter wants return to [facility]      Discharge Placement              Patient [facility] bed at: [facility] Patient to be transferred to facility by: [facility] Name of family member notified: Doria, Fern (Daughter) [phone] [facility]) Patient and family notified of of transfer: 03/30/20  Discharge Plan and Services                                     Social Determinants of Health (SDOH) Interventions     Readmission Risk Interventions No flowsheet data found.

## 2020-03-30 NOTE — Progress Notes (Signed)
Nutrition Follow-up  DOCUMENTATION CODES:   Non-severe (moderate) malnutrition in context of chronic illness  INTERVENTION:   -D/c Ensure Enlive po BID, each supplement provides 350 kcal and 20 grams of protein -Continue MVI with minerals daily -Continue Magic cup TID with meals, each supplement provides 290 kcal and 9 grams of protein -30 ml Prosurce Plus TID, each supplement provides 100 kcals and 15 grams protein -Hormel Shake TID with meals, each supplement provides 520 kcals and 22 grams protein -Continue feeding assistance with meals  NUTRITION DIAGNOSIS:   Moderate Malnutrition related to chronic illness (dementia) as evidenced by mild fat depletion, moderate fat depletion, mild muscle depletion, moderate muscle depletion.  Ongoing  GOAL:   Patient will meet greater than or equal to 90% of their needs  Progressing   MONITOR:   PO intake, Supplement acceptance, Labs, Weight trends, Skin, I & O's  REASON FOR ASSESSMENT:   Consult Assessment of nutrition requirement/status  ASSESSMENT:   79 y.o. female with medical history significant of dementia, HTN, HLDCAD, CABG in 2008, chronic constipation, depression recurrent falls, seizure-like activities, myoclonus Admitted for SIRS, acute encephalopathy  10/29- s/p BSE- advanced to regular diet  Reviewed I/O's: -1.4 L x 24 hours and -107 ml since admission  UOP: 2.4 L x 24 hours  Pt not very interactive with this RD. No family present to provide history. Reviewed MAR from [facility]. PTA pt was on a mechanical soft diet. She was also receiving 30 ml Prostat sugar free BID, Magic Cup TOD, and 120 ml Med Pass TID for supplemental nutrition support.   Pt requires feeding assistance. Noted meal completions 20-25%. She is refusing Ensure supplements.   Medications reviewed and senna.   Palliative care following; plan to d/c back to SNF with hospice services.   Labs reviewed: K: 3.2.   NUTRITION - FOCUSED PHYSICAL  EXAM:    Most Recent Value  Orbital Region Mild depletion  Upper Arm Region Moderate depletion  Thoracic and Lumbar Region Mild depletion  Buccal Region Mild depletion  Temple Region Moderate depletion  Clavicle Bone Region Moderate depletion  Clavicle and Acromion Bone Region Moderate depletion  Scapular Bone Region Moderate depletion  Dorsal Hand Moderate depletion  Patellar Region Moderate depletion  Anterior Thigh Region Moderate depletion  Posterior Calf Region Moderate depletion  Edema (RD Assessment) None  Hair Reviewed  Eyes Reviewed  Mouth Reviewed  Skin Reviewed  Nails Reviewed       Diet Order:   Diet Order            Diet regular Room service appropriate? No; Fluid consistency: Thin  Diet effective now                 EDUCATION NEEDS:   Not appropriate for education at this time  Skin:  Skin Assessment: Reviewed RN Assessment  Last BM:  03/30/20  Height:   Ht Readings from Last 1 Encounters:  03/26/20 5\' 2"  (1.575 m)    Weight:   Wt Readings from Last 1 Encounters:  03/26/20 51.7 kg    Ideal Body Weight:  50 kg  BMI:  Body mass index is 20.85 kg/m.  Estimated Nutritional Needs:   Kcal:  1550-1750  Protein:  85-100 grams  Fluid:  > 1.5 L    03/28/20, RD, LDN, CDCES Registered Dietitian II Certified Diabetes Care and Education Specialist Please refer to [facility] for RD and/or RD on-call/weekend/after hours pager

## 2020-03-30 NOTE — Discharge Summary (Signed)
Physician Discharge Summary  Jasmine Landsberglena Babineaux JXB:[id number] DOB: 04/11/1941 DOA: 03/26/2020  PCP: Laurann MontanaWhite, Cynthia, MD  Admit date: 03/26/2020 Discharge date: 03/30/2020  Admitted From: [facility]  Disposition: [facility] with hospice  Recommendations for Outpatient Follow-up:  1. Follow up with PCP in 1-2 weeks 2. Please obtain BMP/CBC in one week 3. Please follow up on the following pending results:  Home Health: None Equipment/Devices: None Discharge Condition: Stable CODE STATUS: DNR/DNI Diet recommendation: Continue same diet regimen as before  Brief/Interim Summary:  Patient is a 79 year old female with past medical history of dementia, hypertension, hyperlipidemia, coronary artery disease status post CABG, chronic constipation, depression, recurrent falls presented with altered mental status.  ED course: Patient was noted to be tachycardic and febrile or fever of 100.9, CMP shows elevated LFTs, chest x-ray shows widened mediastinum.  CTA chest showed 6.3 cm ascending aortic aneurysm.  UA showed bacteria, imaging showed obstipation with some gallbladder wall abnormality.  CT scan of abdomen showed no evidence of cholecystitis.  She was also placed on 3 L of oxygen due to hypoxia. Was started on Rocephin for the concern of UTI. CT head suspicious for stroke however patient's family refused for further imaging/intervention at this time. Patient Admitted for acute metabolic encephalopathy.    E coli Bacteremia: -Patient presented with tachycardia, tachypnea, hypoxia and fever. -Blood culture from 10/28 came back positive for E. coli.  Reviewed susceptibilities -Initially was started on IV Rocephin which was switched to p.o. amoxicillin 500 mg twice daily as per susceptibilities. Repeat blood culture negative up until now.. -Patient remained afebrile and other vital sign remained stable. -Patient will be discharged on amoxicillin 500 mg twice daily for 6 more days.  Acute  metabolic encephalopathy:  -In the setting of E. coli bacteremia with underlying severe Alzheimer's dementia & dehydration  -No leukocytosis, lactic acid, ammonia, TSH, lipase, troponin: WNL,  -Consulted PT/OT/SLP -On fall/aspiration precautions -Patient symptoms improved and she returned to her baseline. She passed bedside swallow evaluation and was started on p.o. diet which he tolerated very well without any issues.  Acute hypoxemic respiratory failure: Resolved. -Initially patient was requiring 3 L of oxygen via nasal cannula upon admission.  Chest x-ray and CT chest negative for infection.Marland Kitchen.  COVID-19 negative. -Oxygen weaned off to room air.  Abnormal CT head: -Suspicious for acute infarct. -Patient's family refused further aggressive intervention/imaging at this time. -Continue aspirin.   -Statin was hold due to elevated liver enzymes.  elevated liver enzymes: Improving -Ammonia: WNL.  Right upper quadrant ultrasound shows no cholecystitis. -Hepatitis panel: Negative -Liver enzymes significantly improved on the day of discharge.  Coronary artery disease status post CABG in 2008 -Continue aspirin, hold statin due to elevated liver enzymes.  Hypertension: Blood pressure soft upon admission. -Resumed Coreg as blood pressure 146/89 at the time of discharge.  Hyperlipidemia: -Reviewed lipid panel.  Hold statin due to elevated liver enzymes.  CK level: WNL  Hypomagnesemia: Replenished.  Repeat magnesium: WNL  Hypokalemia: Replenished  Constipation: X-ray abdomen showed increased stool in rectum and right colon -Enema was given -Continue senna  Ascending aortic aneurysm: Noted on CTA chest. -Family does not want further intervention.  Normocytic anemia: No signs of active bleeding. -H&H remained stable. -B12: 1206-elevated, low iron, normal ferritin and folate level  Dementia/depression: DC Namenda and Zocor as per palliative care recommendations.  [facility] for [facility]-term in a stable condition.  Discharge Diagnoses:  E. coli bacteremia Acute metabolic encephalopathy  Acute hypoxemic respiratory failure Abnormal CT head Elevated liver enzymes Coronary artery disease status post CABG Hypertension Hyperlipidemia Hypomagnesemia Hypokalemia Constipation/obstipation Ascending aortic aneurysm Normocytic anemia Alzheimer dementia Depression   Discharge Instructions  Discharge Instructions    Diet - low sodium heart healthy   Complete by: As directed    Discharge instructions   Complete by: As directed    Take amoxicillin 500 mg twice daily for 6 more days. Follow-up with PCP as needed Repeat BMP in 1 week.   Increase activity slowly   Complete by: As directed      Allergies as of 03/30/2020   No Known Allergies     Medication List    STOP taking these medications   calcium carbonate 500 MG chewable tablet Commonly known as: TUMS - dosed in mg elemental calcium   docusate sodium 100 MG capsule Commonly known as: COLACE   escitalopram 20 MG tablet Commonly known as: LEXAPRO   memantine 5 MG tablet Commonly known as: NAMENDA   vitamin B-12 1000 MCG tablet Commonly known as: CYANOCOBALAMIN     TAKE these medications   acetaminophen 500 MG tablet Commonly known as: TYLENOL Take 1,000 mg by mouth every 8 (eight) hours.   albuterol 108 (90 Base) MCG/ACT inhaler Commonly known as: VENTOLIN HFA Inhale 2 puffs into the lungs daily. Also 2 puffs via spacer twice daily as needed for wheezing.   amoxicillin 500 MG capsule Commonly known as: AMOXIL Take 1 capsule (500 mg total) by mouth every 12 (twelve) hours for 6 days.   ASPIRIN 81 PO Take 81 mg by mouth daily.   carvedilol 6.25 MG tablet Commonly known as: COREG Take 6.25 mg by mouth at bedtime.   cholecalciferol 25 MCG (1000 UNIT) tablet Commonly known as: VITAMIN D3 Take 1,000 Units by mouth daily.   divalproex  125 MG DR tablet Commonly known as: DEPAKOTE Take 125 mg by mouth 2 (two) times daily.   fluticasone 50 MCG/ACT nasal spray Commonly known as: FLONASE Place 2 sprays into both nostrils daily.   gabapentin 100 MG capsule Commonly known as: NEURONTIN Take 300 mg by mouth 2 (two) times daily.   guaiFENesin 600 MG 12 hr tablet Commonly known as: MUCINEX Take 600 mg by mouth every 12 (twelve) hours.   hydrOXYzine 10 MG tablet Commonly known as: ATARAX/VISTARIL Take 10 mg by mouth in the morning and at bedtime.   Lidocaine 4 % Ptch Apply topically. Apply one patch to back in the AM remove at bedtime.   polyvinyl alcohol 1.4 % ophthalmic solution Commonly known as: LIQUIFILM TEARS Place 1 drop into both eyes 2 (two) times daily as needed for dry eyes.   senna 8.6 MG Tabs tablet Commonly known as: SENOKOT Take 1 tablet (8.6 mg total) by mouth 2 (two) times daily.   simvastatin 40 MG tablet Commonly known as: ZOCOR Take 40 mg by mouth every evening.       No Known Allergies  Consultations:  None   Procedures/Studies: DG Abd 1 View  Result Date: 03/27/2020 CLINICAL DATA:  Obstipation EXAM: ABDOMEN - 1 VIEW COMPARISON:  01/31/2019 FINDINGS: Increased stool in rectum and RIGHT colon. Nonobstructive bowel gas pattern. No bowel dilatation or bowel wall thickening. Osseous demineralization with levoconvex scoliosis and degenerative changes of lumbar spine. Extensive atherosclerotic calcifications of aorta, iliac arteries, and visceral arteries. IMPRESSION: Increased stool in rectum and RIGHT colon. Electronically Signed   By: Ulyses Southward M.D.   On: 03/27/2020 11:14   CT  Head Wo Contrast  Result Date: 03/26/2020 CLINICAL DATA:  Mental status change, unknown cause; altered mental status EXAM: CT HEAD WITHOUT CONTRAST TECHNIQUE: Contiguous axial images were obtained from the base of the skull through the vertex without intravenous contrast. COMPARISON:  CT head 02/01/2019.  FINDINGS: Brain: Advanced cerebral atrophy. There is apparent abnormal hypodensity within the right pons, as well as right midbrain and inferior right thalamus (series 3, images 15-17) (series 5, image 38). This could reflect beam hardening artifact but is suspicious for acute infarction. Advanced ill-defined hypoattenuation within the cerebral white matter is nonspecific, but compatible chronic small vessel ischemic disease. Redemonstrated bilateral basal ganglia mineralization. There is no acute intracranial hemorrhage. No demarcated cortical infarct. No extra-axial fluid collection. No evidence of intracranial mass. No midline shift. Vascular: There is focal hyperdensity within the distal basilar artery (series 5, image 31) (series 3, image 15). Atherosclerotic calcifications. Skull: Normal. Negative for fracture or focal lesion. Sinuses/Orbits: Visualized orbits show no acute finding. Mild ethmoid sinus mucosal thickening. Moderate mucosal thickening and frothy secretions within a small right maxillary sinus. Associated chronic reactive osteitis. No significant mastoid effusion. These results were called by telephone at the time of interpretation on 03/26/2020 at 8:09 pm to provider Dr. Bernette Mayers, who verbally acknowledged these results. IMPRESSION: Apparent abnormal hypodensity within the right pons, as well as right midbrain and inferior right thalamus. This could reflect beam hardening artifact but is suspicious for acute infarct. Additionally, there is focal hyperdensity within the distal basilar artery suspicious for possible endoluminal thrombus. Consider CTA head/neck for further evaluation. Background advanced generalized cerebral atrophy and chronic small vessel ischemic disease. Chronic right maxillary sinusitis. Electronically Signed   By: Jackey Loge DO   On: 03/26/2020 20:10   DG Chest Portable 1 View  Result Date: 03/26/2020 CLINICAL DATA:  Hypoxia, altered mental status. EXAM: PORTABLE CHEST 1  VIEW COMPARISON:  None. FINDINGS: Bulging contour of the ascending aorta, concerning for aneurysm. Aortic atherosclerosis. Mild enlargement the cardiac silhouette. Postsurgical changes of CABG with median sternotomy. Linear right basilar opacities. No confluent consolidation. No visible pleural effusions or pneumothorax. No acute osseous abnormality. IMPRESSION: 1. Findings concerning for aneurysm of the ascending aorta. Recommend CTA chest to further evaluate. 2. Linear right basilar opacities, favor atelectasis. Findings discussed with Dr. Julieanne Manson Via telephone at 2:44 PM. Electronically Signed   By: Feliberto Harts MD   On: 03/26/2020 14:47   ECHOCARDIOGRAM COMPLETE  Result Date: 03/27/2020    ECHOCARDIOGRAM REPORT   Patient Name:   [facility] Date of Exam: 03/27/2020 Medical Rec #:  [id number]     Height:       62.0 in Accession #:    [id number]    Weight:       114.0 lb Date of Birth:  12-29-1940     BSA:          1.505 m Patient Age:    79 years      BP:           109/65 mmHg Patient Gender: F             HR:           98 bpm. Exam Location:  Inpatient Procedure: 2D Echo, Cardiac Doppler and Color Doppler Indications:    Stroke  History:        Patient has no prior history of Echocardiogram examinations.                 CAD, Prior  CABG; Risk Factors:Hypertension, Dyslipidemia and                 Former Smoker.                 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 Procedure Date: 2008.  Sonographer:    Ross Ludwig RDCS (AE) Referring Phys: [id number] ANASTASSIA DOUTOVA  Sonographer Comments: Patient unable to sniff for IVC collapse test. IMPRESSIONS  1. 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 V max 3.2 m/s, MG 21.5 mmHG, EOA 1.07 cm2, DI 0.34. The valve leaflets appear thickened with restricted leaflet motion. The jet is triangular and not rounded (AT <100 msec). Suspect there is mild to moderate stenosis of this prosthetic valve due to the  reported age. The aortic valve has been repaired/replaced. Aortic valve regurgitation is not visualized. There is a bioprosthetic valve present in the aortic position. Procedure Date: 2008.  2. Asending aortic aneurysm up to 63 mm. This was seen on recent CTA. Aortic dilatation noted.  3. Left ventricular ejection fraction, by estimation, is 60 to 65%. The left ventricle has normal function. The left ventricle has no regional wall motion abnormalities. Left ventricular diastolic parameters are consistent with Grade I diastolic dysfunction (impaired relaxation).  4. Right ventricular systolic function is normal. The right ventricular size is normal. There is normal pulmonary artery systolic pressure. The estimated right ventricular systolic pressure is 28.0 mmHg.  5. The mitral valve is degenerative. Trivial mitral valve regurgitation. No evidence of mitral stenosis.  6. Tricuspid valve regurgitation is mild to moderate.  7. The inferior vena cava is normal in size with greater than 50% respiratory variability, suggesting right atrial pressure of 3 mmHg. FINDINGS  Left Ventricle: Left ventricular ejection fraction, by estimation, is 60 to 65%. The left ventricle has normal function. The left ventricle has no regional wall motion abnormalities. The left ventricular internal cavity size was small. There is no left ventricular hypertrophy. Left ventricular diastolic parameters are consistent with Grade I diastolic dysfunction (impaired relaxation). Normal left ventricular filling pressure. Right Ventricle: The right ventricular size is normal. No increase in right ventricular wall thickness. Right ventricular systolic function is normal. There is normal pulmonary artery systolic pressure. The tricuspid regurgitant velocity is 2.50 m/s, and  with an assumed right atrial pressure of 3 mmHg, the estimated right ventricular systolic pressure is 28.0 mmHg. Left Atrium: Left atrial size was normal in size. Right Atrium: Right  atrial size was normal in size. Pericardium: Trivial pericardial effusion is present. Mitral Valve: The mitral valve is degenerative in appearance. Trivial mitral valve regurgitation. No evidence of mitral valve stenosis. Tricuspid Valve: The tricuspid valve is grossly normal. Tricuspid valve regurgitation is mild to moderate. No evidence of tricuspid stenosis. 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 V max 3.2 m/s, MG 21.5 mmHG, EOA 1.07 cm2, DI 0.34. The valve leaflets appear thickened with restricted leaflet motion. The jet is triangular and not rounded (AT <100 msec). Suspect there is mild to moderate stenosis of this prosthetic valve due to the reported age. The aortic valve has been repaired/replaced. Aortic valve regurgitation is not visualized. Aortic valve mean gradient measures 21.5 mmHg. Aortic valve peak gradient measures 41.9 mmHg. Aortic valve area, by VTI measures 1.08 cm. There is a bioprosthetic valve present in the aortic position. Procedure Date: 2008. Pulmonic Valve: The pulmonic valve  was grossly normal. Pulmonic valve regurgitation is not visualized. No evidence of pulmonic stenosis. Aorta: Asending aortic aneurysm up to 63 mm. This was seen on recent CTA. Aortic dilatation noted. Venous: The inferior vena cava is normal in size with greater than 50% respiratory variability, suggesting right atrial pressure of 3 mmHg. IAS/Shunts: The atrial septum is grossly normal.  LEFT VENTRICLE PLAX 2D LVIDd:         3.90 cm  Diastology LVIDs:         2.80 cm  LV e' medial:    6.74 cm/s LV PW:         1.09 cm  LV E/e' medial:  10.4 LV IVS:        1.10 cm  LV e' lateral:   11.60 cm/s LVOT diam:     2.00 cm  LV E/e' lateral: 6.1 LV SV:         68 LV SV Index:   45 LVOT Area:     3.14 cm  RIGHT VENTRICLE            IVC RV Basal diam:  3.20 cm    IVC diam: 1.80 cm RV S prime:     7.62 cm/s TAPSE (M-mode): 0.9 cm LEFT ATRIUM           Index       RIGHT ATRIUM           Index LA  diam:      2.40 cm 1.59 cm/m  RA Area:     14.20 cm LA Vol (A2C): 22.6 ml 15.01 ml/m RA Volume:   35.60 ml  23.65 ml/m LA Vol (A4C): 19.8 ml 13.15 ml/m  AORTIC VALVE AV Area (Vmax):    1.00 cm AV Area (Vmean):   1.04 cm AV Area (VTI):     1.08 cm AV Vmax:           323.67 cm/s AV Vmean:          215.500 cm/s AV VTI:            0.633 m AV Peak Grad:      41.9 mmHg AV Mean Grad:      21.5 mmHg LVOT Vmax:         103.00 cm/s LVOT Vmean:        71.600 cm/s LVOT VTI:          0.217 m LVOT/AV VTI ratio: 0.34  AORTA Ao Root diam: 4.40 cm MITRAL VALVE               TRICUSPID VALVE MV Area (PHT): 2.43 cm    TR Peak grad:   25.0 mmHg MV Decel Time: 312 msec    TR Vmax:        250.00 cm/s MV E velocity: 70.30 cm/s MV A velocity: 58.30 cm/s  SHUNTS MV E/A ratio:  1.21        Systemic VTI:  0.22 m                            Systemic Diam: 2.00 cm Lennie Odor MD Electronically signed by Lennie Odor MD Signature Date/Time: 03/27/2020/10:59:31 AM    Final    CT Angio Chest/Abd/Pel for Dissection W and/or Wo Contrast  Result Date: 03/26/2020 CLINICAL DATA:  Widening of the mediastinum. EXAM: CT ANGIOGRAPHY CHEST, ABDOMEN AND PELVIS TECHNIQUE: Non-contrast CT of the chest was initially obtained. Multidetector CT imaging through the chest,  abdomen and pelvis was performed using the standard protocol during bolus administration of intravenous contrast. Multiplanar reconstructed images and MIPs were obtained and reviewed to evaluate the vascular anatomy. CONTRAST:  75mL OMNIPAQUE IOHEXOL 350 MG/ML SOLN COMPARISON:  None. FINDINGS: CTA CHEST FINDINGS Cardiovascular: There is an ascending thoracic aortic aneurysm measuring approximately 6.3 cm in diameter. There is no CT evidence for a dissection, however evaluation is limited by contrast timing. The sino-tubular junction measures up to approximately 5 cm in diameter. Atherosclerotic changes are noted of the thoracic aorta. The arch vessels appear to be grossly patent  where visualized. The main pulmonary artery is dilated measuring 3.4 cm. Contrast bolus timing is suboptimal for the detection of acute pulmonary emboli. The patient is status post prior median sternotomy and aortic valve replacement. Mediastinum/Nodes: -- No mediastinal lymphadenopathy. -- No hilar lymphadenopathy. -- No axillary lymphadenopathy. -- No supraclavicular lymphadenopathy. -- Normal thyroid gland where visualized. -there is a moderate-sized hiatal hernia. Lungs/Pleura: There are moderate emphysematous changes bilaterally. The lung fields are suboptimally evaluated secondary to respiratory motion artifact. There is an airspace opacity in the right lower lobe that appears to be enhancing and therefore is favored to represent an area of atelectasis. There is no pneumothorax or large pleural effusion. The trachea is unremarkable. Musculoskeletal: There are old healed left-sided rib fractures. No acute displaced fracture identified on today's study. Review of the MIP images confirms the above findings. CTA ABDOMEN AND PELVIS FINDINGS VASCULAR Aorta: There are atherosclerotic changes throughout the abdominal aorta without evidence for an aneurysm. Celiac: There is atherosclerotic disease at the origin of the celiac axis resulting in mild stenosis. SMA: Patent without evidence of aneurysm, dissection, vasculitis or significant stenosis. 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 There is a moderate grade stenosis of the left renal artery. IMA: Patent without evidence of aneurysm, dissection, vasculitis or significant stenosis. Inflow: There is moderate narrowing throughout the bilateral common iliac arteries, right worse than left. There is moderate narrowing throughout the bilateral external iliac arteries, right worse than left. 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 There is complete occlusion of the proximal left SFA. Veins: No obvious venous abnormality within the  limitations of this arterial phase study. Review of the MIP images confirms the above findings. NON-VASCULAR Hepatobiliary: The liver is normal. There is questionable mild gallbladder wall thickening, suboptimally evaluated secondary to streak artifact from the patient's arms.There is no biliary ductal dilation. Pancreas: Normal contours without ductal dilatation. No peripancreatic fluid collection. Spleen: Unremarkable. Adrenals/Urinary Tract: --Adrenal glands: Unremarkable. --Right kidney/ureter: No hydronephrosis or radiopaque kidney stones. --Left kidney/ureter: No hydronephrosis or radiopaque kidney stones. --Urinary bladder: Unremarkable. Stomach/Bowel: --Stomach/Duodenum: No hiatal hernia or other gastric abnormality. Normal duodenal course and caliber. --Small bowel: Unremarkable. --Colon: There is a large amount of stool at the level of the rectum. --Appendix: Normal. Lymphatic: --No retroperitoneal lymphadenopathy. --No mesenteric lymphadenopathy. --No pelvic or inguinal lymphadenopathy. Reproductive: Unremarkable Other: No ascites or free air. The abdominal wall is normal. Musculoskeletal. There is a chronic appearing compression fracture of L1 vertebral body. Multilevel degenerative changes are noted throughout the visualized thoracolumbar spine. No evidence for an acute fracture on today's study. Review of the MIP images confirms the above findings. IMPRESSION: 1. Ascending thoracic aortic aneurysm measuring approximately 6.3 cm in diameter. There is no CT evidence for a dissection, however evaluation is limited by contrast timing. Surgical consultation is recommended. 2. Vascular disease involving the abdomen and bilateral lower extremities as detailed  above. 3. Very large amount of stool at the level of the rectum. 4. Questionable mild gallbladder wall thickening. Correlation with laboratory studies is recommended. 5. Right basilar atelectasis. 6. Status post CABG and aortic valve replacement. Aortic  Atherosclerosis (ICD10-I70.0) and Emphysema (ICD10-J43.9). Electronically Signed   By: Katherine Mantle M.D.   On: 03/26/2020 18:02   US Abdomen Limited RUQ (LIVER/GB)  Result Date: 03/26/2020 CLINICAL DATA:  Cholecystitis EXAM: ULTRASOUND ABDOMEN LIMITED RIGHT UPPER QUADRANT COMPARISON:  CT from the same day FINDINGS: Gallbladder: There is mild gallbladder wall thickening with the gallbladder wall measuring approximately 3 mm. There is sludge without evidence for cholelithiasis. The sonographic Eulah Pont sign is negative. Common bile duct: Diameter: 6 mm Liver: No focal lesion identified. Within normal limits in parenchymal echogenicity. Portal vein is patent on color Doppler imaging with normal direction of blood flow towards the liver. Other: None. IMPRESSION: Gallbladder sludge without sonographic evidence for acute cholecystitis. Electronically Signed   By: Katherine Mantle M.D.   On: 03/26/2020 20:21       Subjective: Patient seen and examined. Resting comfortably on the bed. Not in acute distress. On room air. Alert and awake, sometimes says yes to my questions.  Discharge Exam: Vitals:   03/30/20 0418 03/30/20 0836  BP: (!) 146/89   Pulse: 83 82  Resp: 14 18  Temp: (!) 97.5 F (36.4 C)   SpO2: 96% 96%   Vitals:   03/29/20 1441 03/29/20 2213 03/30/20 0418 03/30/20 0836  BP: (!) 166/89 (!) 152/89 (!) 146/89   Pulse: 87 81 83 82  Resp:  15 14 18   Temp:  98.3 F (36.8 C) (!) 97.5 F (36.4 C)   TempSrc:  Oral Oral   SpO2: 98% 95% 96% 96%  Weight:      Height:        General: Pt is alert, awake, not in acute distress Cardiovascular: RRR, S1/S2 +, no rubs, no gallops Respiratory: CTA bilaterally, no wheezing, no rhonchi Abdominal: Soft, NT, ND, bowel sounds + Extremities: no edema, no cyanosis    The results of significant diagnostics from this hospitalization (including imaging, microbiology, ancillary and laboratory) are listed below for reference.      Microbiology: Recent Results (from the past 240 hour(s))  Respiratory Panel by RT PCR (Flu A&B, Covid) - Nasopharyngeal Swab     Status: None   Collection Time: 03/26/20  2:45 PM   Specimen: Nasopharyngeal Swab  Result Value Ref Range Status   SARS Coronavirus 2 by RT PCR NEGATIVE NEGATIVE Final    Comment: (NOTE) SARS-CoV-2 target nucleic acids are NOT DETECTED.  The SARS-CoV-2 RNA is generally detectable in upper respiratoy specimens during the acute phase of infection. The lowest concentration of SARS-CoV-2 viral copies this assay can detect is 131 copies/mL. A negative result does not preclude SARS-Cov-2 infection and should not be used as the sole basis for treatment or other patient management decisions. A negative result may occur with  improper specimen collection/handling, submission of specimen other than nasopharyngeal swab, presence of viral mutation(s) within the areas targeted by this assay, and inadequate number of viral copies (<131 copies/mL). A negative result must be combined with clinical observations, patient history, and epidemiological information. The expected result is Negative.  Fact Sheet for Patients:  03/28/20  Fact Sheet for Healthcare Providers:  https://www.moore.com/  This test is no t yet approved or cleared by the https://www.young.biz/ FDA and  has been authorized for detection and/or diagnosis of SARS-CoV-2 by FDA  under an Emergency Use Authorization (EUA). This EUA will remain  in effect (meaning this test can be used) for the duration of the COVID-19 declaration under Section 564(b)(1) of the Act, 21 U.S.C. section 360bbb-3(b)(1), unless the authorization is terminated or revoked sooner.     Influenza A by PCR NEGATIVE NEGATIVE Final   Influenza B by PCR NEGATIVE NEGATIVE Final    Comment: (NOTE) The Xpert Xpress SARS-CoV-2/FLU/RSV assay is intended as an aid in  the diagnosis of  influenza from Nasopharyngeal swab specimens and  should not be used as a sole basis for treatment. Nasal washings and  aspirates are unacceptable for Xpert Xpress SARS-CoV-2/FLU/RSV  testing.  Fact Sheet for Patients: https://www.moore.com/  Fact Sheet for Healthcare Providers: https://www.young.biz/  This test is not yet approved or cleared by the [place] FDA and  has been authorized for detection and/or diagnosis of SARS-CoV-2 by  FDA under an Emergency Use Authorization (EUA). This EUA will remain  in effect (meaning this test can be used) for the duration of the  Covid-19 declaration under Section 564(b)(1) of the Act, 21  U.S.C. section 360bbb-3(b)(1), unless the authorization is  terminated or revoked. Performed at [facility], [address] [place], [place] [address]   Blood culture (routine x 2)     Status: Abnormal   Collection Time: 03/26/20  2:46 PM   Specimen: BLOOD  Result Value Ref Range Status   Specimen Description BLOOD LEFT ANTECUBITAL  Final   Special Requests   Final    BOTTLES DRAWN AEROBIC AND ANAEROBIC Blood Culture adequate volume   Culture  Setup Time   Final    IN BOTH AEROBIC AND ANAEROBIC BOTTLES GRAM NEGATIVE RODS Organism ID to follow CRITICAL RESULT CALLED TO, READ BACK BY AND VERIFIED WITH: K AMEND [facility] 03/27/20 0557 JDW Performed at [facility], [address] [place], [place] [address]    Culture ESCHERICHIA COLI (A)  Final   Report Status 03/29/2020 FINAL  Final   Organism ID, Bacteria ESCHERICHIA COLI  Final      Susceptibility   Escherichia coli - MIC*    AMPICILLIN 4 SENSITIVE Sensitive     CEFAZOLIN <=4 SENSITIVE Sensitive     CEFEPIME <=0.12 SENSITIVE Sensitive     CEFTAZIDIME <=1 SENSITIVE Sensitive     CEFTRIAXONE <=0.25 SENSITIVE Sensitive     CIPROFLOXACIN <=0.25 SENSITIVE Sensitive     GENTAMICIN <=1 SENSITIVE Sensitive     IMIPENEM <=0.25 SENSITIVE Sensitive      TRIMETH/SULFA >=320 RESISTANT Resistant     AMPICILLIN/SULBACTAM <=2 SENSITIVE Sensitive     PIP/TAZO <=4 SENSITIVE Sensitive     * ESCHERICHIA COLI  Blood Culture ID Panel (Reflexed)     Status: Abnormal   Collection Time: 03/26/20  2:46 PM  Result Value Ref Range Status   Enterococcus faecalis NOT DETECTED NOT DETECTED Final   Enterococcus Faecium NOT DETECTED NOT DETECTED Final   Listeria monocytogenes NOT DETECTED NOT DETECTED Final   Staphylococcus species NOT DETECTED NOT DETECTED Final   Staphylococcus aureus (BCID) NOT DETECTED NOT DETECTED Final   Staphylococcus epidermidis NOT DETECTED NOT DETECTED Final   Staphylococcus lugdunensis NOT DETECTED NOT DETECTED Final   Streptococcus species NOT DETECTED NOT DETECTED Final   Streptococcus agalactiae NOT DETECTED NOT DETECTED Final   Streptococcus pneumoniae NOT DETECTED NOT DETECTED Final   Streptococcus pyogenes NOT DETECTED NOT DETECTED Final   A.calcoaceticus-baumannii NOT DETECTED NOT DETECTED Final   Bacteroides fragilis NOT  DETECTED NOT DETECTED Final   Enterobacterales DETECTED (A) NOT DETECTED Final    Comment: Enterobacterales represent a large order of gram negative bacteria, not a single organism. CRITICAL RESULT CALLED TO, READ BACK BY AND VERIFIED WITH: K AMEND PHARMD 03/27/20 0557 JDW    Enterobacter cloacae complex NOT DETECTED NOT DETECTED Final   Escherichia coli DETECTED (A) NOT DETECTED Final    Comment: CRITICAL RESULT CALLED TO, READ BACK BY AND VERIFIED WITH: K AMEND PHARMD 03/27/20 0557 JDW    Klebsiella aerogenes NOT DETECTED NOT DETECTED Final   Klebsiella oxytoca NOT DETECTED NOT DETECTED Final   Klebsiella pneumoniae NOT DETECTED NOT DETECTED Final   Proteus species NOT DETECTED NOT DETECTED Final   Salmonella species NOT DETECTED NOT DETECTED Final   Serratia marcescens NOT DETECTED NOT DETECTED Final   Haemophilus influenzae NOT DETECTED NOT DETECTED Final   Neisseria meningitidis NOT DETECTED  NOT DETECTED Final   Pseudomonas aeruginosa NOT DETECTED NOT DETECTED Final   Stenotrophomonas maltophilia NOT DETECTED NOT DETECTED Final   Candida albicans NOT DETECTED NOT DETECTED Final   Candida auris NOT DETECTED NOT DETECTED Final   Candida glabrata NOT DETECTED NOT DETECTED Final   Candida krusei NOT DETECTED NOT DETECTED Final   Candida parapsilosis NOT DETECTED NOT DETECTED Final   Candida tropicalis NOT DETECTED NOT DETECTED Final   Cryptococcus neoformans/gattii NOT DETECTED NOT DETECTED Final   CTX-M ESBL NOT DETECTED NOT DETECTED Final   Carbapenem resistance IMP NOT DETECTED NOT DETECTED Final   Carbapenem resistance KPC NOT DETECTED NOT DETECTED Final   Carbapenem resistance NDM NOT DETECTED NOT DETECTED Final   Carbapenem resist OXA 48 LIKE NOT DETECTED NOT DETECTED Final   Carbapenem resistance VIM NOT DETECTED NOT DETECTED Final    Comment: Performed at [facility], [address] [place], [place] [address]  Blood culture (routine x 2)     Status: Abnormal   Collection Time: 03/26/20  3:00 PM   Specimen: BLOOD RIGHT ARM  Result Value Ref Range Status   Specimen Description BLOOD RIGHT ARM  Final   Special Requests   Final    BOTTLES DRAWN AEROBIC AND ANAEROBIC Blood Culture adequate volume   Culture  Setup Time   Final    IN BOTH AEROBIC AND ANAEROBIC BOTTLES GRAM NEGATIVE RODS CRITICAL VALUE NOTED.  VALUE IS CONSISTENT WITH PREVIOUSLY REPORTED AND CALLED VALUE.    Culture (A)  Final    ESCHERICHIA COLI SUSCEPTIBILITIES PERFORMED ON PREVIOUS CULTURE WITHIN THE LAST 5 DAYS. Performed at [facility], [address] [place], [place] [address]    Report Status 03/29/2020 FINAL  Final  Respiratory Panel by PCR     Status: None   Collection Time: 03/26/20  6:49 PM   Specimen: Nasopharyngeal Swab; Respiratory  Result Value Ref Range Status   Adenovirus NOT DETECTED NOT DETECTED Final   Coronavirus 229E NOT DETECTED NOT DETECTED Final    Comment:  (NOTE) The Coronavirus on the Respiratory Panel, DOES NOT test for the novel  Coronavirus (2019 nCoV)    Coronavirus HKU1 NOT DETECTED NOT DETECTED Final   Coronavirus NL63 NOT DETECTED NOT DETECTED Final   Coronavirus OC43 NOT DETECTED NOT DETECTED Final   Metapneumovirus NOT DETECTED NOT DETECTED Final   Rhinovirus / Enterovirus NOT DETECTED NOT DETECTED Final   Influenza A NOT DETECTED NOT DETECTED Final   Influenza B NOT DETECTED NOT DETECTED Final   Parainfluenza Virus 1 NOT DETECTED NOT DETECTED Final  Parainfluenza Virus 2 NOT DETECTED NOT DETECTED Final   Parainfluenza Virus 3 NOT DETECTED NOT DETECTED Final   Parainfluenza Virus 4 NOT DETECTED NOT DETECTED Final   Respiratory Syncytial Virus NOT DETECTED NOT DETECTED Final   Bordetella pertussis NOT DETECTED NOT DETECTED Final   Chlamydophila pneumoniae NOT DETECTED NOT DETECTED Final   Mycoplasma pneumoniae NOT DETECTED NOT DETECTED Final    Comment: Performed at [facility], [address], [place], [place] [address]  MRSA PCR Screening     Status: None   Collection Time: 03/27/20 12:14 AM   Specimen: Nasal Mucosa; Nasopharyngeal  Result Value Ref Range Status   MRSA by PCR NEGATIVE NEGATIVE Final    Comment:        The GeneXpert MRSA Assay (FDA approved for NASAL specimens only), is one component of a comprehensive MRSA colonization surveillance program. It is not intended to diagnose MRSA infection nor to guide or monitor treatment for MRSA infections. Performed at [facility], [address] [place], [place] [address]   Culture, blood (routine x 2)     Status: None (Preliminary result)   Collection Time: 03/28/20  8:30 PM   Specimen: BLOOD  Result Value Ref Range Status   Specimen Description BLOOD LEFT ANTECUBITAL  Final   Special Requests   Final    BOTTLES DRAWN AEROBIC ONLY Blood Culture results may not be optimal due to an inadequate volume of blood received in culture bottles   Culture    Final    NO GROWTH 2 DAYS Performed at [facility], [address], [place], [place] [address]    Report Status PENDING  Incomplete  Culture, blood (routine x 2)     Status: None (Preliminary result)   Collection Time: 03/29/20  5:05 AM   Specimen: BLOOD  Result Value Ref Range Status   Specimen Description BLOOD LEFT ANTECUBITAL  Final   Special Requests   Final    BOTTLES DRAWN AEROBIC ONLY Blood Culture results may not be optimal due to an inadequate volume of blood received in culture bottles   Culture   Final    NO GROWTH 1 DAY Performed at [facility], [address] [place], [place] [address]    Report Status PENDING  Incomplete     Labs: BNP (last 3 results) No results for input(s): BNP in the last 8760 hours. Basic Metabolic Panel: Recent Labs  Lab 03/26/20 1416 03/26/20 1416 03/26/20 1517 03/26/20 2246 03/27/20 0413 03/28/20 0120 03/29/20 0458 03/30/20 0141  NA 143   < > 144  --  144 143 139 140  K 4.2   < > 4.1  --  3.5 3.8 3.9 3.2*  CL 106  --   --   --  112* 111 108 109  CO2 26  --   --   --  24 25 23 23   GLUCOSE 117*  --   --   --  96 92 97 101*  BUN 25*  --   --   --  20 21 9  <5*  CREATININE 0.66  --   --   --  0.47 0.39* 0.39* 0.36*  CALCIUM 9.2  --   --   --  8.3* 8.3* 8.3* 8.2*  MG  --   --   --  1.3* 1.9  --   --  1.9  PHOS  --   --   --   --  2.6  --   --   --    < > =  values in this interval not displayed.   Liver Function Tests: Recent Labs  Lab 03/26/20 1416 03/27/20 0413 03/28/20 0120 03/29/20 0458 03/30/20 0141  AST 185* 96* 66* 51* 32  ALT 184* 129* 101* 80* 61*  ALKPHOS 192* 137* 143* 154* 145*  BILITOT 0.8 0.6 0.4 1.1 0.3  PROT 6.9 5.5* 5.3* 5.8* 5.5*  ALBUMIN 3.4* 2.6* 2.6* 2.8* 2.6*   Recent Labs  Lab 03/26/20 1416  LIPASE 44   Recent Labs  Lab 03/26/20 1417  AMMONIA 11   CBC: Recent Labs  Lab 03/26/20 1416 03/26/20 1416 03/26/20 1517 03/27/20 0413 03/28/20 0120 03/29/20 0458 03/30/20 0141   WBC 10.2  --   --  4.8 3.0* 2.5* 2.1*  NEUTROABS 9.4*  --   --  4.0  --   --   --   HGB 12.6   < > 13.3 9.7* 10.1* 10.5* 11.0*  HCT 40.9   < > 39.0 31.4* 32.0* 33.5* 34.1*  MCV 87.4  --   --  85.3 87.2 86.1 84.8  PLT 195  --   --  153 151 197 212   < > = values in this interval not displayed.   Cardiac Enzymes: Recent Labs  Lab 03/26/20 2246  CKTOTAL 176   BNP: Invalid input(s): POCBNP CBG: No results for input(s): GLUCAP in the last 168 hours. D-Dimer No results for input(s): DDIMER in the last 72 hours. Hgb A1c No results for input(s): HGBA1C in the last 72 hours. Lipid Profile No results for input(s): CHOL, HDL, LDLCALC, TRIG, CHOLHDL, LDLDIRECT in the last 72 hours. Thyroid function studies No results for input(s): TSH, T4TOTAL, T3FREE, THYROIDAB in the last 72 hours.  Invalid input(s): FREET3 Anemia work up Recent Labs    03/27/20 1531  VITAMINB12 1,206*  FOLATE 27.5  FERRITIN 65  TIBC 333  IRON 12*   Urinalysis    Component Value Date/Time   COLORURINE YELLOW 03/26/2020 1517   APPEARANCEUR CLEAR 03/26/2020 1517   LABSPEC 1.025 03/26/2020 1517   PHURINE 5.5 03/26/2020 1517   GLUCOSEU NEGATIVE 03/26/2020 1517   HGBUR NEGATIVE 03/26/2020 1517   BILIRUBINUR NEGATIVE 03/26/2020 1517   KETONESUR NEGATIVE 03/26/2020 1517   PROTEINUR NEGATIVE 03/26/2020 1517   NITRITE NEGATIVE 03/26/2020 1517   LEUKOCYTESUR SMALL (A) 03/26/2020 1517   Sepsis Labs Invalid input(s): PROCALCITONIN,  WBC,  LACTICIDVEN Microbiology Recent Results (from the past 240 hour(s))  Respiratory Panel by RT PCR (Flu A&B, Covid) - Nasopharyngeal Swab     Status: None   Collection Time: 03/26/20  2:45 PM   Specimen: Nasopharyngeal Swab  Result Value Ref Range Status   SARS Coronavirus 2 by RT PCR NEGATIVE NEGATIVE Final    Comment: (NOTE) SARS-CoV-2 target nucleic acids are NOT DETECTED.  The SARS-CoV-2 RNA is generally detectable in upper respiratoy specimens during the acute phase  of infection. The lowest concentration of SARS-CoV-2 viral copies this assay can detect is 131 copies/mL. A negative result does not preclude SARS-Cov-2 infection and should not be used as the sole basis for treatment or other patient management decisions. A negative result may occur with  improper specimen collection/handling, submission of specimen other than nasopharyngeal swab, presence of viral mutation(s) within the areas targeted by this assay, and inadequate number of viral copies (<131 copies/mL). A negative result must be combined with clinical observations, patient history, and epidemiological information. The expected result is Negative.  Fact Sheet for Patients:  https://www.moore.com/  Fact Sheet for Healthcare Providers:  https://www.young.biz/  This test is no t yet approved or cleared by the [place] and  has been authorized for detection and/or diagnosis of SARS-CoV-2 by FDA under an Emergency Use Authorization (EUA). This EUA will remain  in effect (meaning this test can be used) for the duration of the COVID-19 declaration under Section 564(b)(1) of the Act, 21 U.S.C. section 360bbb-3(b)(1), unless the authorization is terminated or revoked sooner.     Influenza A by PCR NEGATIVE NEGATIVE Final   Influenza B by PCR NEGATIVE NEGATIVE Final    Comment: (NOTE) The Xpert Xpress SARS-CoV-2/FLU/RSV assay is intended as an aid in  the diagnosis of influenza from Nasopharyngeal swab specimens and  should not be used as a sole basis for treatment. Nasal washings and  aspirates are unacceptable for Xpert Xpress SARS-CoV-2/FLU/RSV  testing.  Fact Sheet for Patients: https://www.moore.com/  Fact Sheet for Healthcare Providers: https://www.young.biz/  This test is not yet approved or cleared by the [place] FDA and  has been authorized for detection and/or diagnosis of  SARS-CoV-2 by  FDA under an Emergency Use Authorization (EUA). This EUA will remain  in effect (meaning this test can be used) for the duration of the  Covid-19 declaration under Section 564(b)(1) of the Act, 21  U.S.C. section 360bbb-3(b)(1), unless the authorization is  terminated or revoked. Performed at [facility], [address], [place], [place] [address]   Blood culture (routine x 2)     Status: Abnormal   Collection Time: 03/26/20  2:46 PM   Specimen: BLOOD  Result Value Ref Range Status   Specimen Description BLOOD LEFT ANTECUBITAL  Final   Special Requests   Final    BOTTLES DRAWN AEROBIC AND ANAEROBIC Blood Culture adequate volume   Culture  Setup Time   Final    IN BOTH AEROBIC AND ANAEROBIC BOTTLES GRAM NEGATIVE RODS Organism ID to follow CRITICAL RESULT CALLED TO, READ BACK BY AND VERIFIED WITH: K AMEND [facility] 03/27/20 0557 JDW Performed at [facility], [address], [place], [place] [address]    Culture ESCHERICHIA COLI (A)  Final   Report Status 03/29/2020 FINAL  Final   Organism ID, Bacteria ESCHERICHIA COLI  Final      Susceptibility   Escherichia coli - MIC*    AMPICILLIN 4 SENSITIVE Sensitive     CEFAZOLIN <=4 SENSITIVE Sensitive     CEFEPIME <=0.12 SENSITIVE Sensitive     CEFTAZIDIME <=1 SENSITIVE Sensitive     CEFTRIAXONE <=0.25 SENSITIVE Sensitive     CIPROFLOXACIN <=0.25 SENSITIVE Sensitive     GENTAMICIN <=1 SENSITIVE Sensitive     IMIPENEM <=0.25 SENSITIVE Sensitive     TRIMETH/SULFA >=320 RESISTANT Resistant     AMPICILLIN/SULBACTAM <=2 SENSITIVE Sensitive     PIP/TAZO <=4 SENSITIVE Sensitive     * ESCHERICHIA COLI  Blood Culture ID Panel (Reflexed)     Status: Abnormal   Collection Time: 03/26/20  2:46 PM  Result Value Ref Range Status   Enterococcus faecalis NOT DETECTED NOT DETECTED Final   Enterococcus Faecium NOT DETECTED NOT DETECTED Final   Listeria monocytogenes NOT DETECTED NOT DETECTED Final   Staphylococcus species  NOT DETECTED NOT DETECTED Final   Staphylococcus aureus (BCID) NOT DETECTED NOT DETECTED Final   Staphylococcus epidermidis NOT DETECTED NOT DETECTED Final   Staphylococcus lugdunensis NOT DETECTED NOT DETECTED Final   Streptococcus species NOT DETECTED NOT DETECTED Final   Streptococcus agalactiae NOT DETECTED NOT DETECTED Final   Streptococcus pneumoniae  NOT DETECTED NOT DETECTED Final   Streptococcus pyogenes NOT DETECTED NOT DETECTED Final   A.calcoaceticus-baumannii NOT DETECTED NOT DETECTED Final   Bacteroides fragilis NOT DETECTED NOT DETECTED Final   Enterobacterales DETECTED (A) NOT DETECTED Final    Comment: Enterobacterales represent a large order of gram negative bacteria, not a single organism. CRITICAL RESULT CALLED TO, READ BACK BY AND VERIFIED WITH: K AMEND PHARMD 03/27/20 0557 JDW    Enterobacter cloacae complex NOT DETECTED NOT DETECTED Final   Escherichia coli DETECTED (A) NOT DETECTED Final    Comment: CRITICAL RESULT CALLED TO, READ BACK BY AND VERIFIED WITH: K AMEND PHARMD 03/27/20 0557 JDW    Klebsiella aerogenes NOT DETECTED NOT DETECTED Final   Klebsiella oxytoca NOT DETECTED NOT DETECTED Final   Klebsiella pneumoniae NOT DETECTED NOT DETECTED Final   Proteus species NOT DETECTED NOT DETECTED Final   Salmonella species NOT DETECTED NOT DETECTED Final   Serratia marcescens NOT DETECTED NOT DETECTED Final   Haemophilus influenzae NOT DETECTED NOT DETECTED Final   Neisseria meningitidis NOT DETECTED NOT DETECTED Final   Pseudomonas aeruginosa NOT DETECTED NOT DETECTED Final   Stenotrophomonas maltophilia NOT DETECTED NOT DETECTED Final   Candida albicans NOT DETECTED NOT DETECTED Final   Candida auris NOT DETECTED NOT DETECTED Final   Candida glabrata NOT DETECTED NOT DETECTED Final   Candida krusei NOT DETECTED NOT DETECTED Final   Candida parapsilosis NOT DETECTED NOT DETECTED Final   Candida tropicalis NOT DETECTED NOT DETECTED Final   Cryptococcus  neoformans/gattii NOT DETECTED NOT DETECTED Final   CTX-M ESBL NOT DETECTED NOT DETECTED Final   Carbapenem resistance IMP NOT DETECTED NOT DETECTED Final   Carbapenem resistance KPC NOT DETECTED NOT DETECTED Final   Carbapenem resistance NDM NOT DETECTED NOT DETECTED Final   Carbapenem resist OXA 48 LIKE NOT DETECTED NOT DETECTED Final   Carbapenem resistance VIM NOT DETECTED NOT DETECTED Final    Comment: Performed at [facility], [address] [place], [place] [address]  Blood culture (routine x 2)     Status: Abnormal   Collection Time: 03/26/20  3:00 PM   Specimen: BLOOD RIGHT ARM  Result Value Ref Range Status   Specimen Description BLOOD RIGHT ARM  Final   Special Requests   Final    BOTTLES DRAWN AEROBIC AND ANAEROBIC Blood Culture adequate volume   Culture  Setup Time   Final    IN BOTH AEROBIC AND ANAEROBIC BOTTLES GRAM NEGATIVE RODS CRITICAL VALUE NOTED.  VALUE IS CONSISTENT WITH PREVIOUSLY REPORTED AND CALLED VALUE.    Culture (A)  Final    ESCHERICHIA COLI SUSCEPTIBILITIES PERFORMED ON PREVIOUS CULTURE WITHIN THE LAST 5 DAYS. Performed at [facility], [address], [place], [place] [address]    Report Status 03/29/2020 FINAL  Final  Respiratory Panel by PCR     Status: None   Collection Time: 03/26/20  6:49 PM   Specimen: Nasopharyngeal Swab; Respiratory  Result Value Ref Range Status   Adenovirus NOT DETECTED NOT DETECTED Final   Coronavirus 229E NOT DETECTED NOT DETECTED Final    Comment: (NOTE) The Coronavirus on the Respiratory Panel, DOES NOT test for the novel  Coronavirus (2019 nCoV)    Coronavirus HKU1 NOT DETECTED NOT DETECTED Final   Coronavirus NL63 NOT DETECTED NOT DETECTED Final   Coronavirus OC43 NOT DETECTED NOT DETECTED Final   Metapneumovirus NOT DETECTED NOT DETECTED Final   Rhinovirus / Enterovirus NOT DETECTED NOT DETECTED Final  Influenza A NOT DETECTED NOT DETECTED Final   Influenza B NOT DETECTED NOT DETECTED Final    Parainfluenza Virus 1 NOT DETECTED NOT DETECTED Final   Parainfluenza Virus 2 NOT DETECTED NOT DETECTED Final   Parainfluenza Virus 3 NOT DETECTED NOT DETECTED Final   Parainfluenza Virus 4 NOT DETECTED NOT DETECTED Final   Respiratory Syncytial Virus NOT DETECTED NOT DETECTED Final   Bordetella pertussis NOT DETECTED NOT DETECTED Final   Chlamydophila pneumoniae NOT DETECTED NOT DETECTED Final   Mycoplasma pneumoniae NOT DETECTED NOT DETECTED Final    Comment: Performed at [facility], [address], [place], [place] [address]  MRSA PCR Screening     Status: None   Collection Time: 03/27/20 12:14 AM   Specimen: Nasal Mucosa; Nasopharyngeal  Result Value Ref Range Status   MRSA by PCR NEGATIVE NEGATIVE Final    Comment:        The GeneXpert MRSA Assay (FDA approved for NASAL specimens only), is one component of a comprehensive MRSA colonization surveillance program. It is not intended to diagnose MRSA infection nor to guide or monitor treatment for MRSA infections. Performed at [facility], [address], [place], [place] [address]   Culture, blood (routine x 2)     Status: None (Preliminary result)   Collection Time: 03/28/20  8:30 PM   Specimen: BLOOD  Result Value Ref Range Status   Specimen Description BLOOD LEFT ANTECUBITAL  Final   Special Requests   Final    BOTTLES DRAWN AEROBIC ONLY Blood Culture results may not be optimal due to an inadequate volume of blood received in culture bottles   Culture   Final    NO GROWTH 2 DAYS Performed at [facility], [address], [place], [place] [address]    Report Status PENDING  Incomplete  Culture, blood (routine x 2)     Status: None (Preliminary result)   Collection Time: 03/29/20  5:05 AM   Specimen: BLOOD  Result Value Ref Range Status   Specimen Description BLOOD LEFT ANTECUBITAL  Final   Special Requests   Final    BOTTLES DRAWN AEROBIC ONLY Blood Culture results may not be optimal due to an  inadequate volume of blood received in culture bottles   Culture   Final    NO GROWTH 1 DAY Performed at [facility], [address] [place], [place] [address]    Report Status PENDING  Incomplete     Time coordinating discharge: Over 30 minutes  SIGNED:   Ollen Bowl, MD  [facility] 03/30/2020, 11:40 AM Pager   If 7PM-7AM, please contact night-coverage [email or web address]

## 2020-03-31 DIAGNOSIS — K5901 Slow transit constipation: Secondary | ICD-10-CM | POA: Diagnosis not present

## 2020-03-31 DIAGNOSIS — E878 Other disorders of electrolyte and fluid balance, not elsewhere classified: Secondary | ICD-10-CM | POA: Diagnosis not present

## 2020-03-31 DIAGNOSIS — G9340 Encephalopathy, unspecified: Secondary | ICD-10-CM | POA: Diagnosis not present

## 2020-03-31 DIAGNOSIS — R7881 Bacteremia: Secondary | ICD-10-CM | POA: Diagnosis not present

## 2020-03-31 DIAGNOSIS — I2510 Atherosclerotic heart disease of native coronary artery without angina pectoris: Secondary | ICD-10-CM | POA: Diagnosis not present

## 2020-03-31 DIAGNOSIS — B9629 Other Escherichia coli [E. coli] as the cause of diseases classified elsewhere: Secondary | ICD-10-CM | POA: Diagnosis not present

## 2020-03-31 DIAGNOSIS — J9601 Acute respiratory failure with hypoxia: Secondary | ICD-10-CM | POA: Diagnosis not present

## 2020-03-31 DIAGNOSIS — I6389 Other cerebral infarction: Secondary | ICD-10-CM | POA: Diagnosis not present

## 2020-03-31 DIAGNOSIS — E7849 Other hyperlipidemia: Secondary | ICD-10-CM | POA: Diagnosis not present

## 2020-03-31 DIAGNOSIS — Z515 Encounter for palliative care: Secondary | ICD-10-CM | POA: Diagnosis not present

## 2020-03-31 DIAGNOSIS — I714 Abdominal aortic aneurysm, without rupture: Secondary | ICD-10-CM | POA: Diagnosis not present

## 2020-03-31 DIAGNOSIS — R0989 Other specified symptoms and signs involving the circulatory and respiratory systems: Secondary | ICD-10-CM | POA: Diagnosis not present

## 2020-04-02 LAB — CULTURE, BLOOD (ROUTINE X 2): Culture: NO GROWTH

## 2020-04-03 LAB — CULTURE, BLOOD (ROUTINE X 2): Culture: NO GROWTH

## 2020-04-06 DIAGNOSIS — E559 Vitamin D deficiency, unspecified: Secondary | ICD-10-CM | POA: Diagnosis not present

## 2020-04-06 DIAGNOSIS — D649 Anemia, unspecified: Secondary | ICD-10-CM | POA: Diagnosis not present

## 2020-04-06 DIAGNOSIS — D519 Vitamin B12 deficiency anemia, unspecified: Secondary | ICD-10-CM | POA: Diagnosis not present

## 2020-04-07 DIAGNOSIS — E870 Hyperosmolality and hypernatremia: Secondary | ICD-10-CM | POA: Diagnosis not present

## 2020-04-07 DIAGNOSIS — R627 Adult failure to thrive: Secondary | ICD-10-CM | POA: Diagnosis not present

## 2020-04-13 DIAGNOSIS — G9341 Metabolic encephalopathy: Secondary | ICD-10-CM | POA: Diagnosis not present

## 2020-04-13 DIAGNOSIS — E860 Dehydration: Secondary | ICD-10-CM | POA: Diagnosis not present

## 2020-04-13 DIAGNOSIS — R627 Adult failure to thrive: Secondary | ICD-10-CM | POA: Diagnosis not present

## 2020-04-13 DIAGNOSIS — I10 Essential (primary) hypertension: Secondary | ICD-10-CM | POA: Diagnosis not present

## 2020-04-14 DIAGNOSIS — E870 Hyperosmolality and hypernatremia: Secondary | ICD-10-CM | POA: Diagnosis not present

## 2020-04-15 DIAGNOSIS — S92492S Other fracture of left great toe, sequela: Secondary | ICD-10-CM | POA: Diagnosis not present

## 2020-04-15 DIAGNOSIS — R293 Abnormal posture: Secondary | ICD-10-CM | POA: Diagnosis not present

## 2020-04-15 DIAGNOSIS — F0390 Unspecified dementia without behavioral disturbance: Secondary | ICD-10-CM | POA: Diagnosis not present

## 2020-04-15 DIAGNOSIS — S82891S Other fracture of right lower leg, sequela: Secondary | ICD-10-CM | POA: Diagnosis not present

## 2020-04-15 DIAGNOSIS — S92492D Other fracture of left great toe, subsequent encounter for fracture with routine healing: Secondary | ICD-10-CM | POA: Diagnosis not present

## 2020-04-15 DIAGNOSIS — R41841 Cognitive communication deficit: Secondary | ICD-10-CM | POA: Diagnosis not present

## 2020-04-15 DIAGNOSIS — Z9181 History of falling: Secondary | ICD-10-CM | POA: Diagnosis not present

## 2020-04-15 DIAGNOSIS — R1312 Dysphagia, oropharyngeal phase: Secondary | ICD-10-CM | POA: Diagnosis not present

## 2020-04-15 DIAGNOSIS — R278 Other lack of coordination: Secondary | ICD-10-CM | POA: Diagnosis not present

## 2020-04-15 DIAGNOSIS — M24571 Contracture, right ankle: Secondary | ICD-10-CM | POA: Diagnosis not present

## 2020-04-15 DIAGNOSIS — R2681 Unsteadiness on feet: Secondary | ICD-10-CM | POA: Diagnosis not present

## 2020-04-15 DIAGNOSIS — S82891D Other fracture of right lower leg, subsequent encounter for closed fracture with routine healing: Secondary | ICD-10-CM | POA: Diagnosis not present

## 2020-04-15 DIAGNOSIS — M24572 Contracture, left ankle: Secondary | ICD-10-CM | POA: Diagnosis not present

## 2020-04-15 DIAGNOSIS — M6281 Muscle weakness (generalized): Secondary | ICD-10-CM | POA: Diagnosis not present

## 2020-04-15 DIAGNOSIS — R2689 Other abnormalities of gait and mobility: Secondary | ICD-10-CM | POA: Diagnosis not present

## 2020-04-16 DIAGNOSIS — S82891D Other fracture of right lower leg, subsequent encounter for closed fracture with routine healing: Secondary | ICD-10-CM | POA: Diagnosis not present

## 2020-04-16 DIAGNOSIS — S92492D Other fracture of left great toe, subsequent encounter for fracture with routine healing: Secondary | ICD-10-CM | POA: Diagnosis not present

## 2020-04-16 DIAGNOSIS — M24572 Contracture, left ankle: Secondary | ICD-10-CM | POA: Diagnosis not present

## 2020-04-16 DIAGNOSIS — Z9181 History of falling: Secondary | ICD-10-CM | POA: Diagnosis not present

## 2020-04-16 DIAGNOSIS — S92492S Other fracture of left great toe, sequela: Secondary | ICD-10-CM | POA: Diagnosis not present

## 2020-04-16 DIAGNOSIS — S82891S Other fracture of right lower leg, sequela: Secondary | ICD-10-CM | POA: Diagnosis not present

## 2020-04-17 DIAGNOSIS — S92492S Other fracture of left great toe, sequela: Secondary | ICD-10-CM | POA: Diagnosis not present

## 2020-04-17 DIAGNOSIS — S92492D Other fracture of left great toe, subsequent encounter for fracture with routine healing: Secondary | ICD-10-CM | POA: Diagnosis not present

## 2020-04-17 DIAGNOSIS — M24572 Contracture, left ankle: Secondary | ICD-10-CM | POA: Diagnosis not present

## 2020-04-17 DIAGNOSIS — Z9181 History of falling: Secondary | ICD-10-CM | POA: Diagnosis not present

## 2020-04-17 DIAGNOSIS — S82891D Other fracture of right lower leg, subsequent encounter for closed fracture with routine healing: Secondary | ICD-10-CM | POA: Diagnosis not present

## 2020-04-17 DIAGNOSIS — S82891S Other fracture of right lower leg, sequela: Secondary | ICD-10-CM | POA: Diagnosis not present

## 2020-04-19 DIAGNOSIS — S82891D Other fracture of right lower leg, subsequent encounter for closed fracture with routine healing: Secondary | ICD-10-CM | POA: Diagnosis not present

## 2020-04-19 DIAGNOSIS — S82891S Other fracture of right lower leg, sequela: Secondary | ICD-10-CM | POA: Diagnosis not present

## 2020-04-19 DIAGNOSIS — M24572 Contracture, left ankle: Secondary | ICD-10-CM | POA: Diagnosis not present

## 2020-04-19 DIAGNOSIS — S92492D Other fracture of left great toe, subsequent encounter for fracture with routine healing: Secondary | ICD-10-CM | POA: Diagnosis not present

## 2020-04-19 DIAGNOSIS — S92492S Other fracture of left great toe, sequela: Secondary | ICD-10-CM | POA: Diagnosis not present

## 2020-04-19 DIAGNOSIS — Z9181 History of falling: Secondary | ICD-10-CM | POA: Diagnosis not present

## 2020-04-29 DEATH — deceased

## 2020-06-02 ENCOUNTER — Telehealth: Payer: Self-pay | Admitting: Neurology

## 2020-06-02 NOTE — Telephone Encounter (Signed)
Events noted, the patient had end-stage Alzheimer's disease.

## 2020-06-02 NOTE — Telephone Encounter (Signed)
Notified by Marrian Salvage; Pt passed away 05/12/23.

## 2020-06-03 ENCOUNTER — Ambulatory Visit: Payer: Medicare Other | Admitting: Neurology

## 2022-04-02 IMAGING — CT CT ANGIO CHEST-ABD-PELV FOR DISSECTION W/ AND WO/W CM
2 of 7 series · 13 of 46 positions shown, 15 images · IV contrast (APPLIED)
Comparison: None.

CLINICAL DATA: Widening of the mediastinum.

EXAM:
CT ANGIOGRAPHY CHEST, ABDOMEN AND PELVIS
TECHNIQUE: Non-contrast CT of the chest was initially obtained.

[Series 7: arterial · axial · arterial · 0.70mm/px · z∈[-432,+48]mm · 10 of 280 slices shown, 12 images]
[im 20/280  soft-tissue]
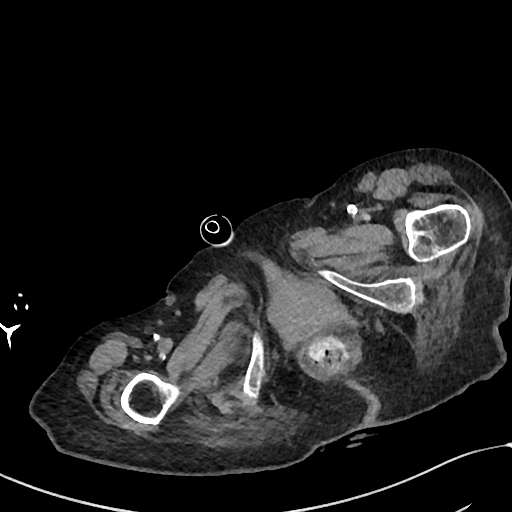
[im 20/280  bone]
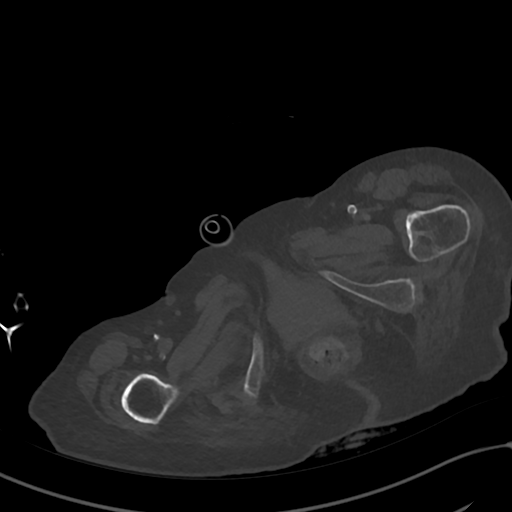
[im 40/280  soft-tissue]
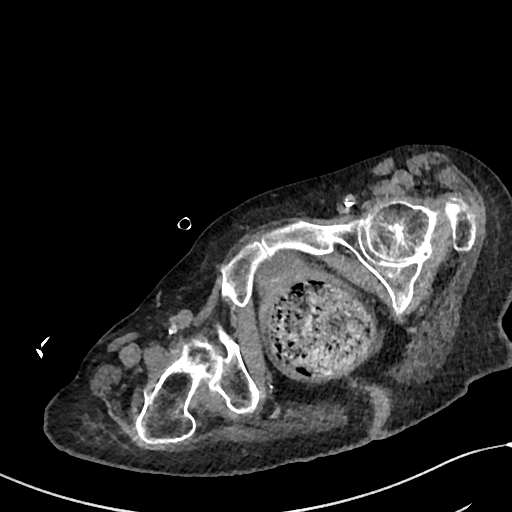
[im 80/280  soft-tissue]
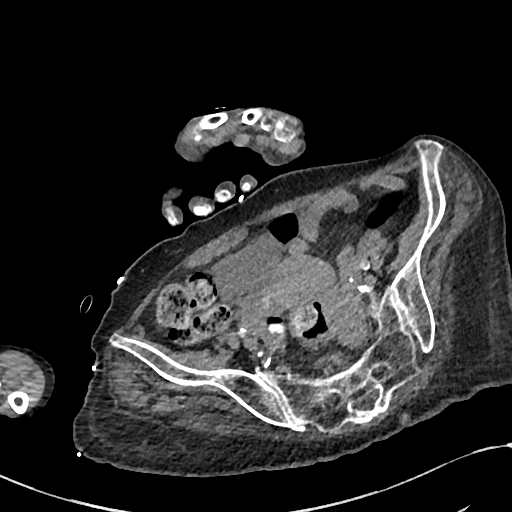
[im 100/280  soft-tissue]
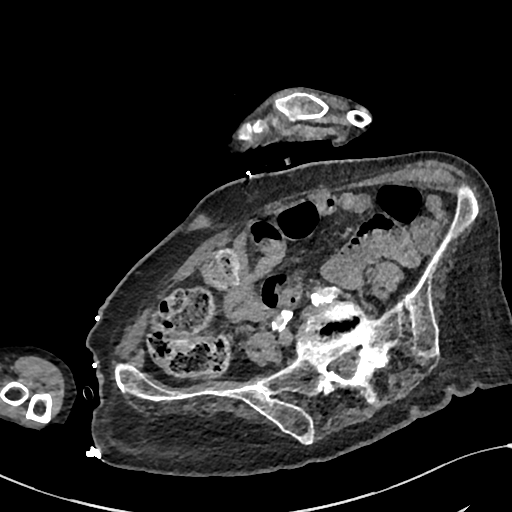
[im 120/280  soft-tissue]
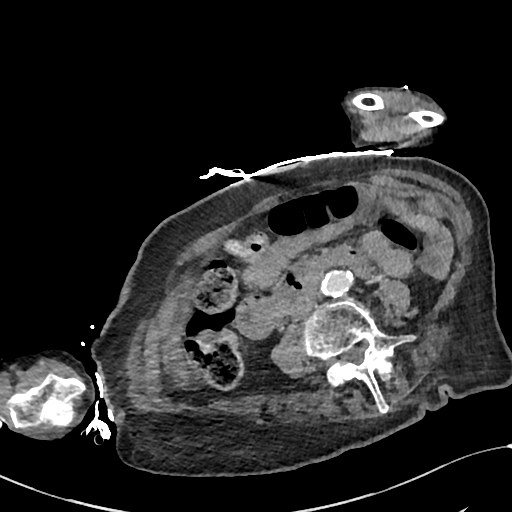
[im 160/280  soft-tissue]
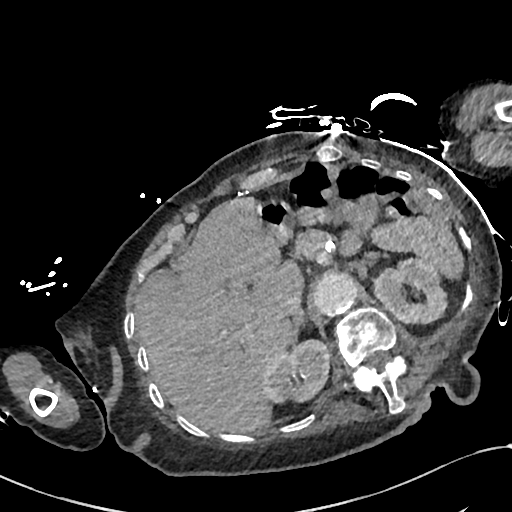
[im 180/280  soft-tissue]
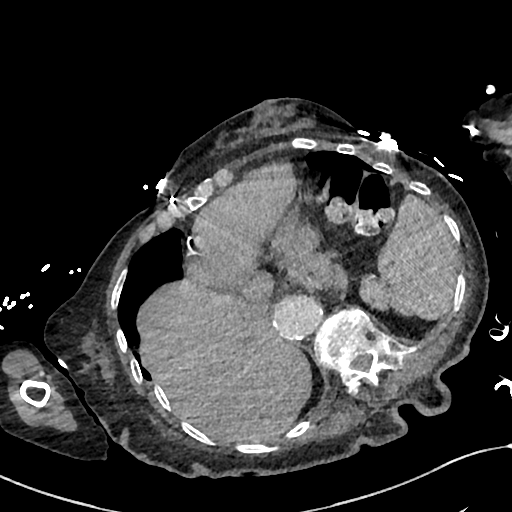
[im 200/280  soft-tissue]
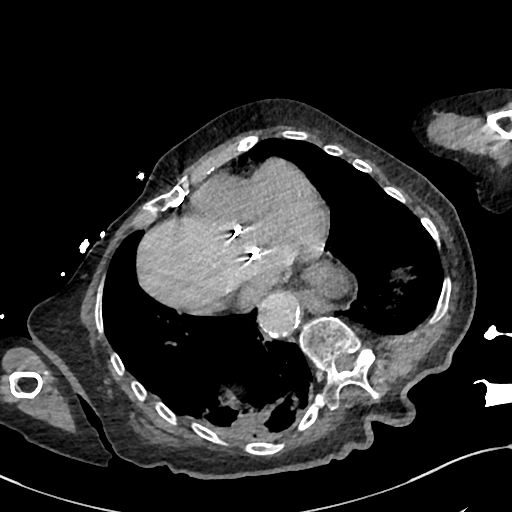
[im 240/280  soft-tissue]
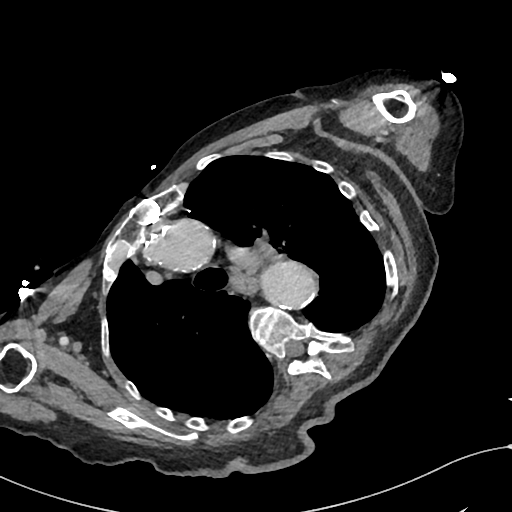
[im 240/280  bone]
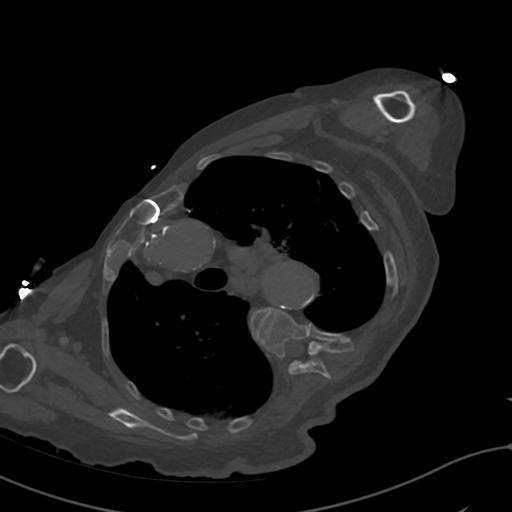
[im 260/280  soft-tissue]
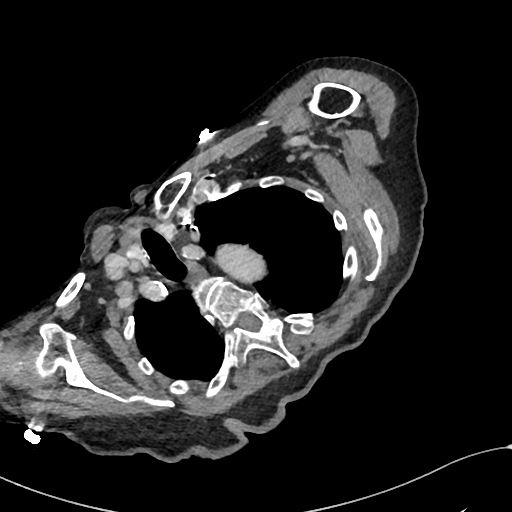

[Series 10: cor · coronal · 0.82mm/px · 3 of 139 slices shown]
[im 35/139  soft-tissue]
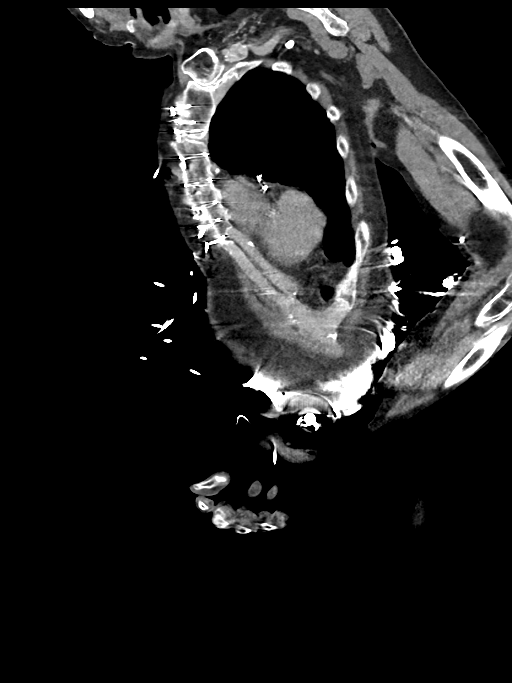
[im 70/139  soft-tissue]
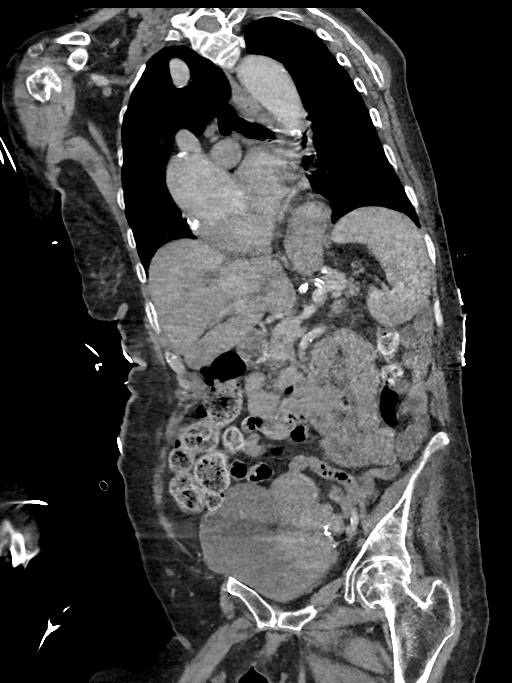
[im 104/139  soft-tissue]
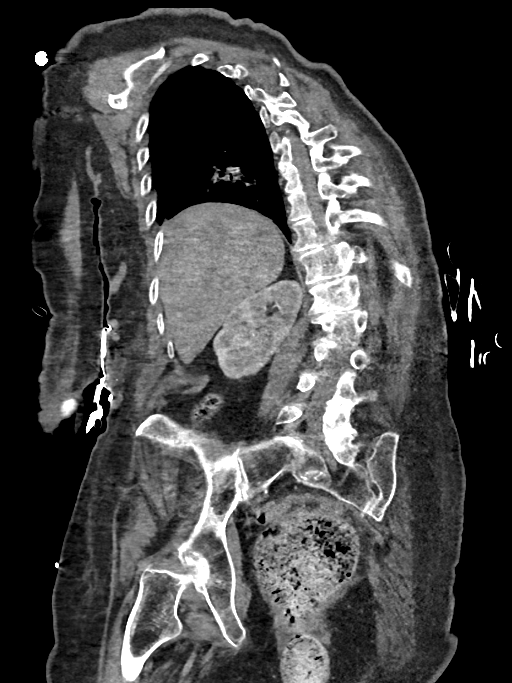

[13 of 46 positions shown; findings below may reference images not displayed]

Multidetector CT imaging through the chest, abdomen and pelvis was
performed using the standard protocol during bolus administration of
intravenous contrast. Multiplanar reconstructed images and MIPs were
obtained and reviewed to evaluate the vascular anatomy.

CONTRAST:  75mL OMNIPAQUE IOHEXOL 350 MG/ML SOLN
FINDINGS: CTA CHEST FINDINGS

Cardiovascular: There is an ascending thoracic aortic aneurysm
measuring approximately 6.3 cm in diameter. There is no CT evidence
for a dissection, however evaluation is limited by contrast timing.
The [place] Del junction measures up to approximately 5 cm in
diameter. Atherosclerotic changes are noted of the thoracic aorta.
The arch vessels appear to be grossly patent where visualized. The
main pulmonary artery is dilated measuring 3.4 cm. Contrast bolus
timing is suboptimal for the detection of acute pulmonary emboli.
The patient is status post prior median sternotomy and aortic valve
replacement.

Mediastinum/Nodes:

-- No mediastinal lymphadenopathy.

-- No hilar lymphadenopathy.

-- No axillary lymphadenopathy.

-- No supraclavicular lymphadenopathy.

-- Normal thyroid gland where visualized.

-there is a moderate-sized hiatal hernia.

Lungs/Pleura: There are moderate emphysematous changes bilaterally.
The lung fields are suboptimally evaluated secondary to respiratory
motion artifact. There is an airspace opacity in the right lower
lobe that appears to be enhancing and therefore is favored to
represent an area of atelectasis. There is no pneumothorax or large
pleural effusion. The trachea is unremarkable.

Musculoskeletal: There are old healed left-sided rib fractures. No
acute displaced fracture identified on today's study.

Review of the MIP images confirms the above findings.

CTA ABDOMEN AND PELVIS FINDINGS

VASCULAR

Aorta: There are atherosclerotic changes throughout the abdominal
aorta without evidence for an aneurysm.

Celiac: There is atherosclerotic disease at the origin of the celiac
axis resulting in mild stenosis.

SMA: Patent without evidence of aneurysm, dissection, vasculitis or
significant stenosis.

Renals: There is a high-grade stenosis of the right renal artery.
There is a moderate grade stenosis of the left renal artery.

IMA: Patent without evidence of aneurysm, dissection, vasculitis or
significant stenosis.

Inflow: There is moderate narrowing throughout the bilateral common
iliac arteries, right worse than left. There is moderate narrowing
throughout the bilateral external iliac arteries, right worse than
left. There is complete occlusion of the proximal right SFA. There
is complete occlusion of the proximal left SFA.

Veins: No obvious venous abnormality within the limitations of this
arterial phase study.

Review of the MIP images confirms the above findings.

NON-VASCULAR

Hepatobiliary: The liver is normal. There is questionable mild
gallbladder wall thickening, suboptimally evaluated secondary to
streak artifact from the patient's arms.There is no biliary ductal
dilation.

Pancreas: Normal contours without ductal dilatation. No
peripancreatic fluid collection.

Spleen: Unremarkable.

Adrenals/Urinary Tract:

--Adrenal glands: Unremarkable.

--Right kidney/ureter: No hydronephrosis or radiopaque kidney
stones.

--Left kidney/ureter: No hydronephrosis or radiopaque kidney stones.

--Urinary bladder: Unremarkable.

Stomach/Bowel:

--Stomach/Duodenum: No hiatal hernia or other gastric abnormality.
Normal duodenal course and caliber.

--Small bowel: Unremarkable.

--Colon: There is a large amount of stool at the level of the
rectum.

--Appendix: Normal.

Lymphatic:

--No retroperitoneal lymphadenopathy.

--No mesenteric lymphadenopathy.

--No pelvic or inguinal lymphadenopathy.

Reproductive: Unremarkable

Other: No ascites or free air. The abdominal wall is normal.

Musculoskeletal. There is a chronic appearing compression fracture
of L1 vertebral body. Multilevel degenerative changes are noted
throughout the visualized thoracolumbar spine. No evidence for an
acute fracture on today's study.

Review of the MIP images confirms the above findings.
IMPRESSION: 1. Ascending thoracic aortic aneurysm measuring approximately 6.3 cm
in diameter. There is no CT evidence for a dissection, however
evaluation is limited by contrast timing. Surgical consultation is
recommended.
2. Vascular disease involving the abdomen and bilateral lower
extremities as detailed above.
3. Very large amount of stool at the level of the rectum.
4. Questionable mild gallbladder wall thickening. Correlation with
laboratory studies is recommended.
5. Right basilar atelectasis.
6. Status post CABG and aortic valve replacement.

Aortic Atherosclerosis (XTAPU-V96.6) and Emphysema ([id number].0).

## 2022-04-02 IMAGING — US US ABDOMEN LIMITED
1 series · 14 of 25 positions shown · non-contrast
Comparison: CT from the same day

CLINICAL DATA: Cholecystitis

EXAM:
ULTRASOUND ABDOMEN LIMITED RIGHT UPPER QUADRANT

[Series 1: us abdomen limited ruq (liver/gb) · 14 of 40 slices shown]
[im 1/40]
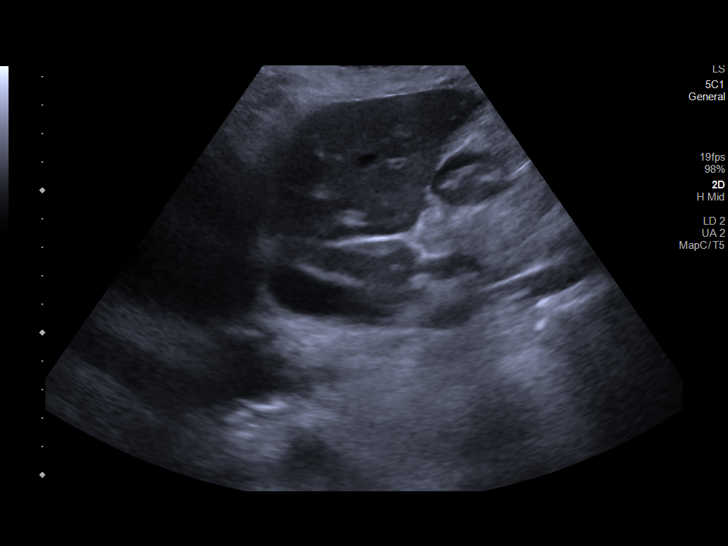
[im 4/40]
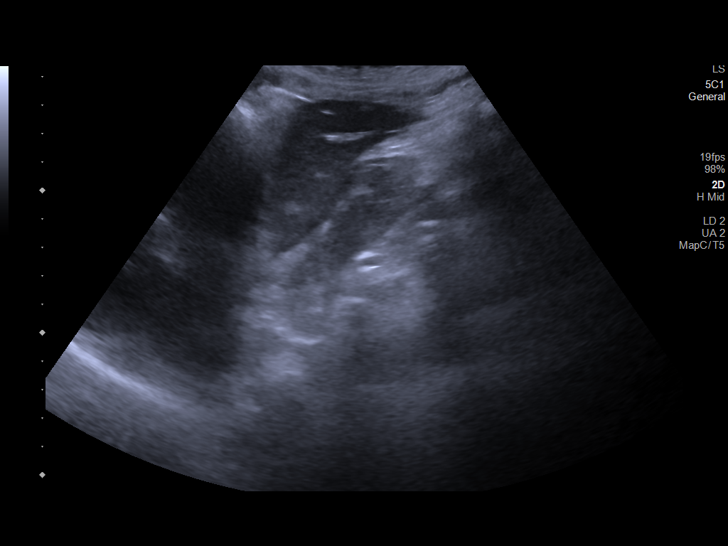
[im 7/40]
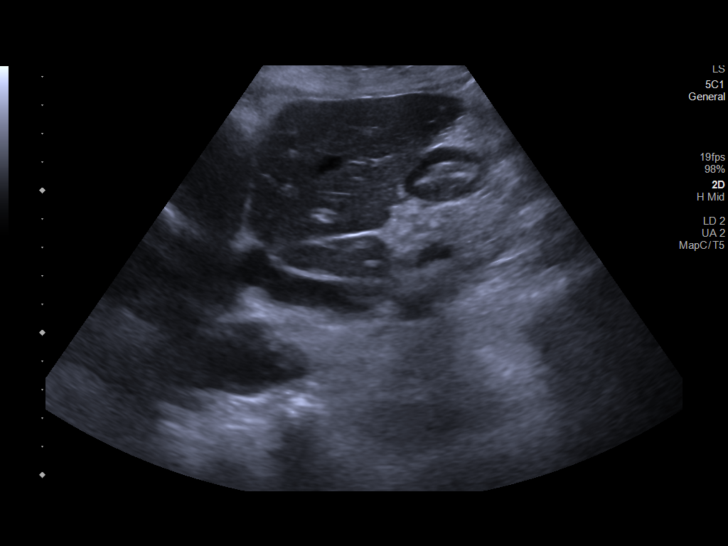
[im 10/40]
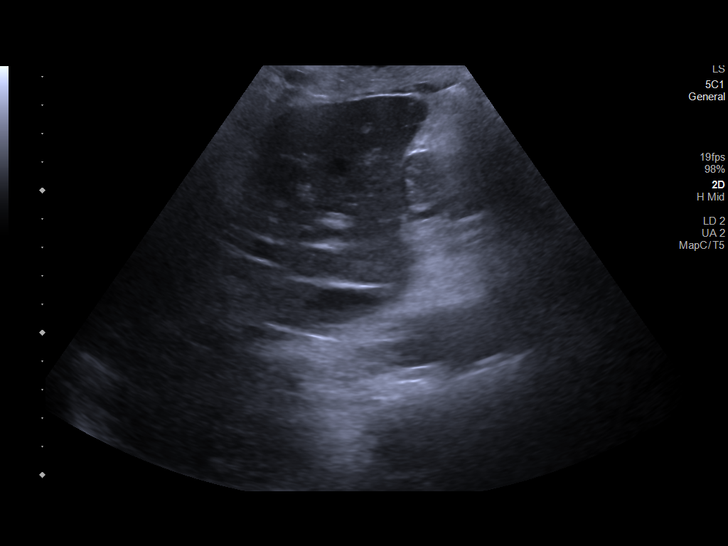
[im 14/40]
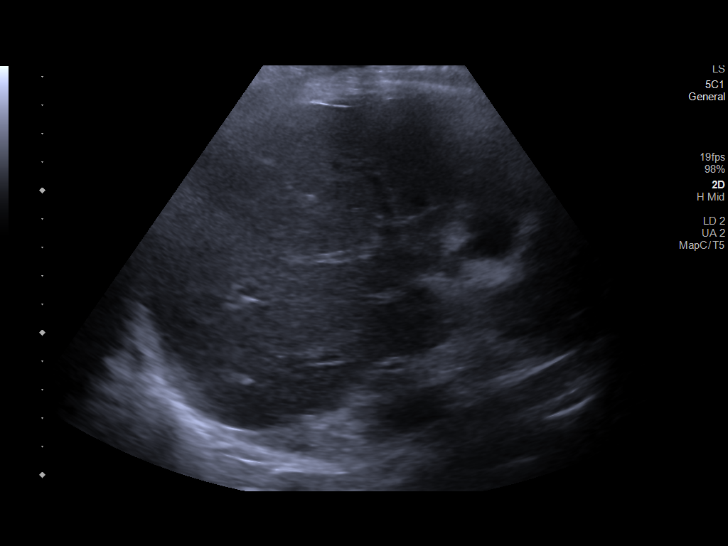
[im 15/40]
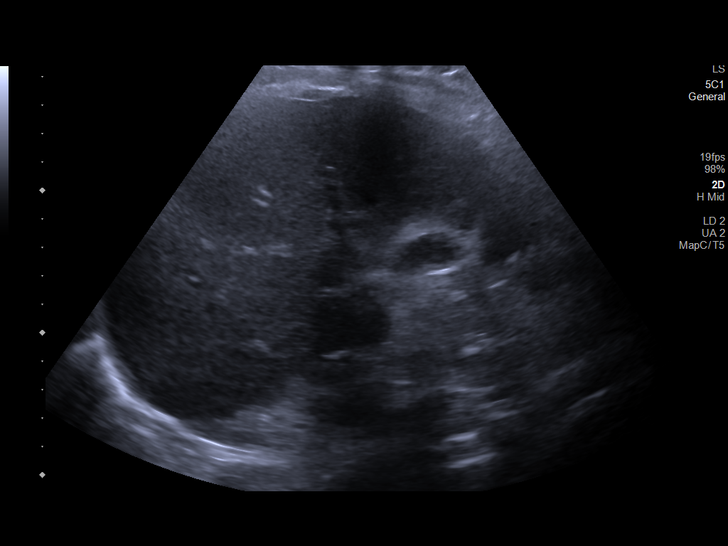
[im 18/40]
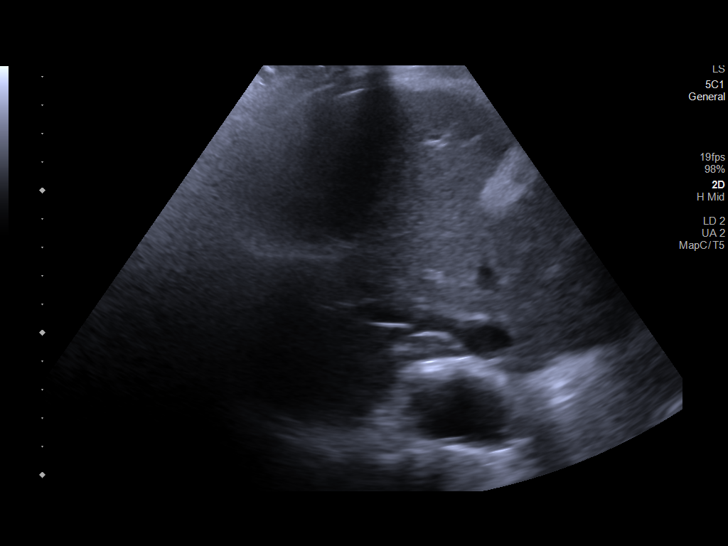
[im 22/40]
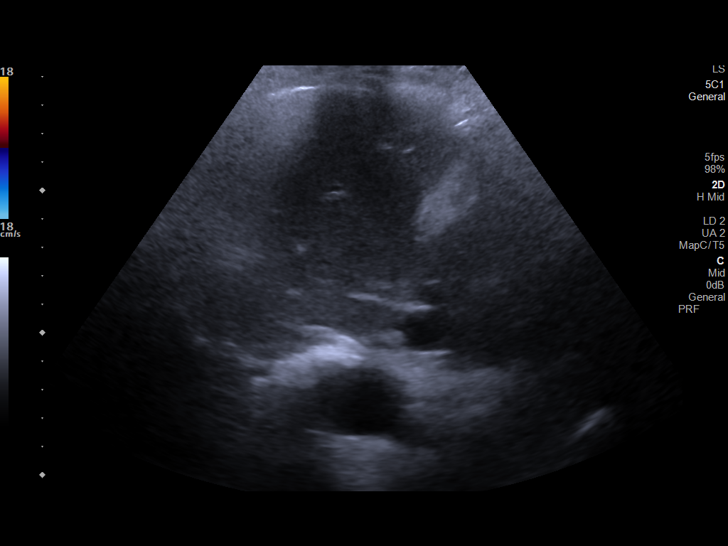
[im 25/40]
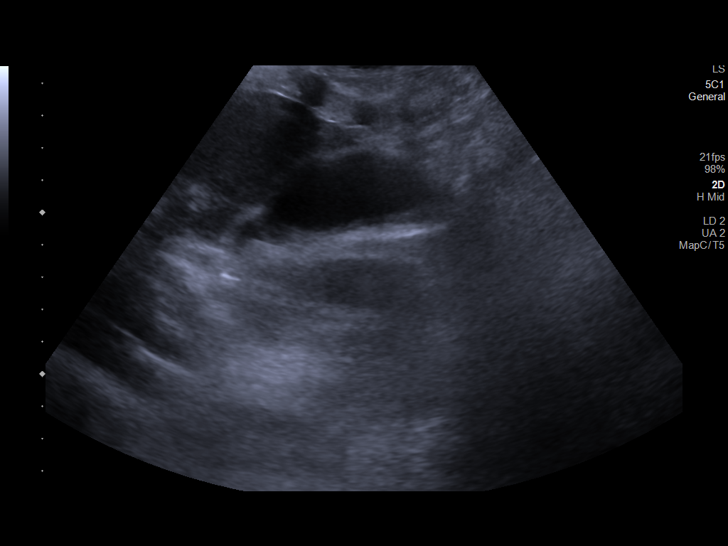
[im 27/40]
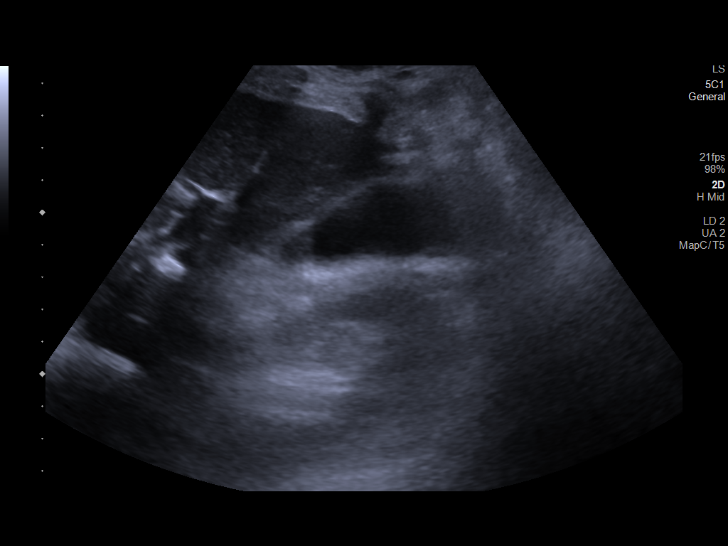
[im 30/40]
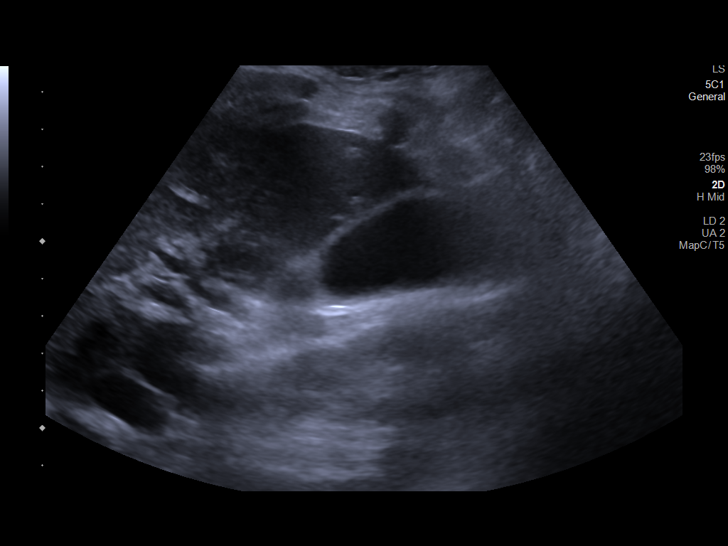
[im 33/40]
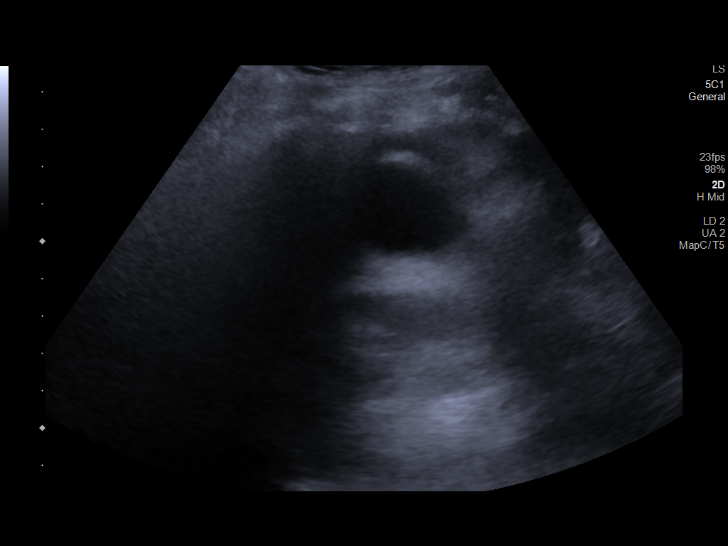
[im 36/40]
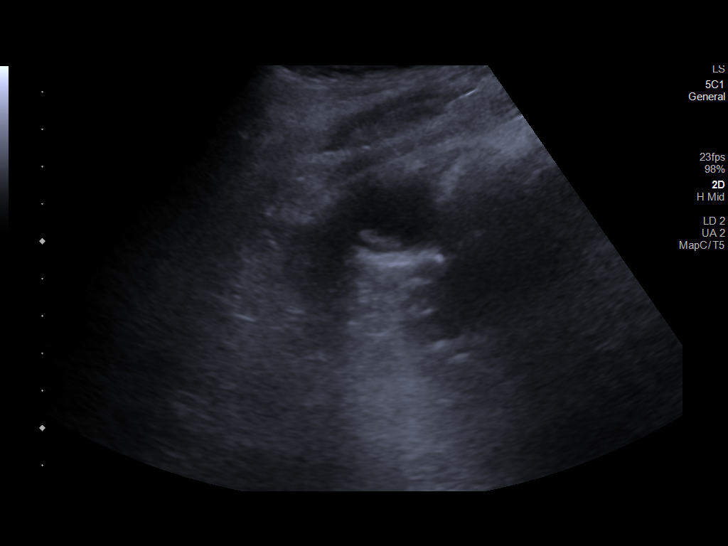
[im 40/40]
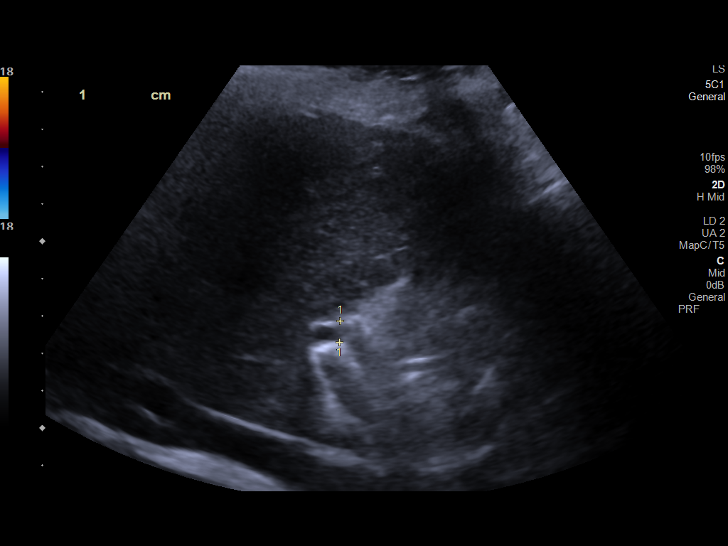

[14 of 25 positions shown; findings below may reference images not displayed]

FINDINGS: Gallbladder:

There is mild gallbladder wall thickening with the gallbladder wall
measuring approximately 3 mm. There is sludge without evidence for
cholelithiasis. The sonographic Murphy sign is negative.

Common bile duct:

Diameter: 6 mm

Liver:

No focal lesion identified. Within normal limits in parenchymal
echogenicity. Portal vein is patent on color Doppler imaging with
normal direction of blood flow towards the liver.

Other: None.
IMPRESSION: Gallbladder sludge without sonographic evidence for acute
cholecystitis.

## 2022-04-03 IMAGING — DX DG ABDOMEN 1V
1 series · 1 of 1 positions shown · non-contrast
Comparison: 01/31/2019

CLINICAL DATA: Obstipation

EXAM:
ABDOMEN - 1 VIEW

[abdomen kub]
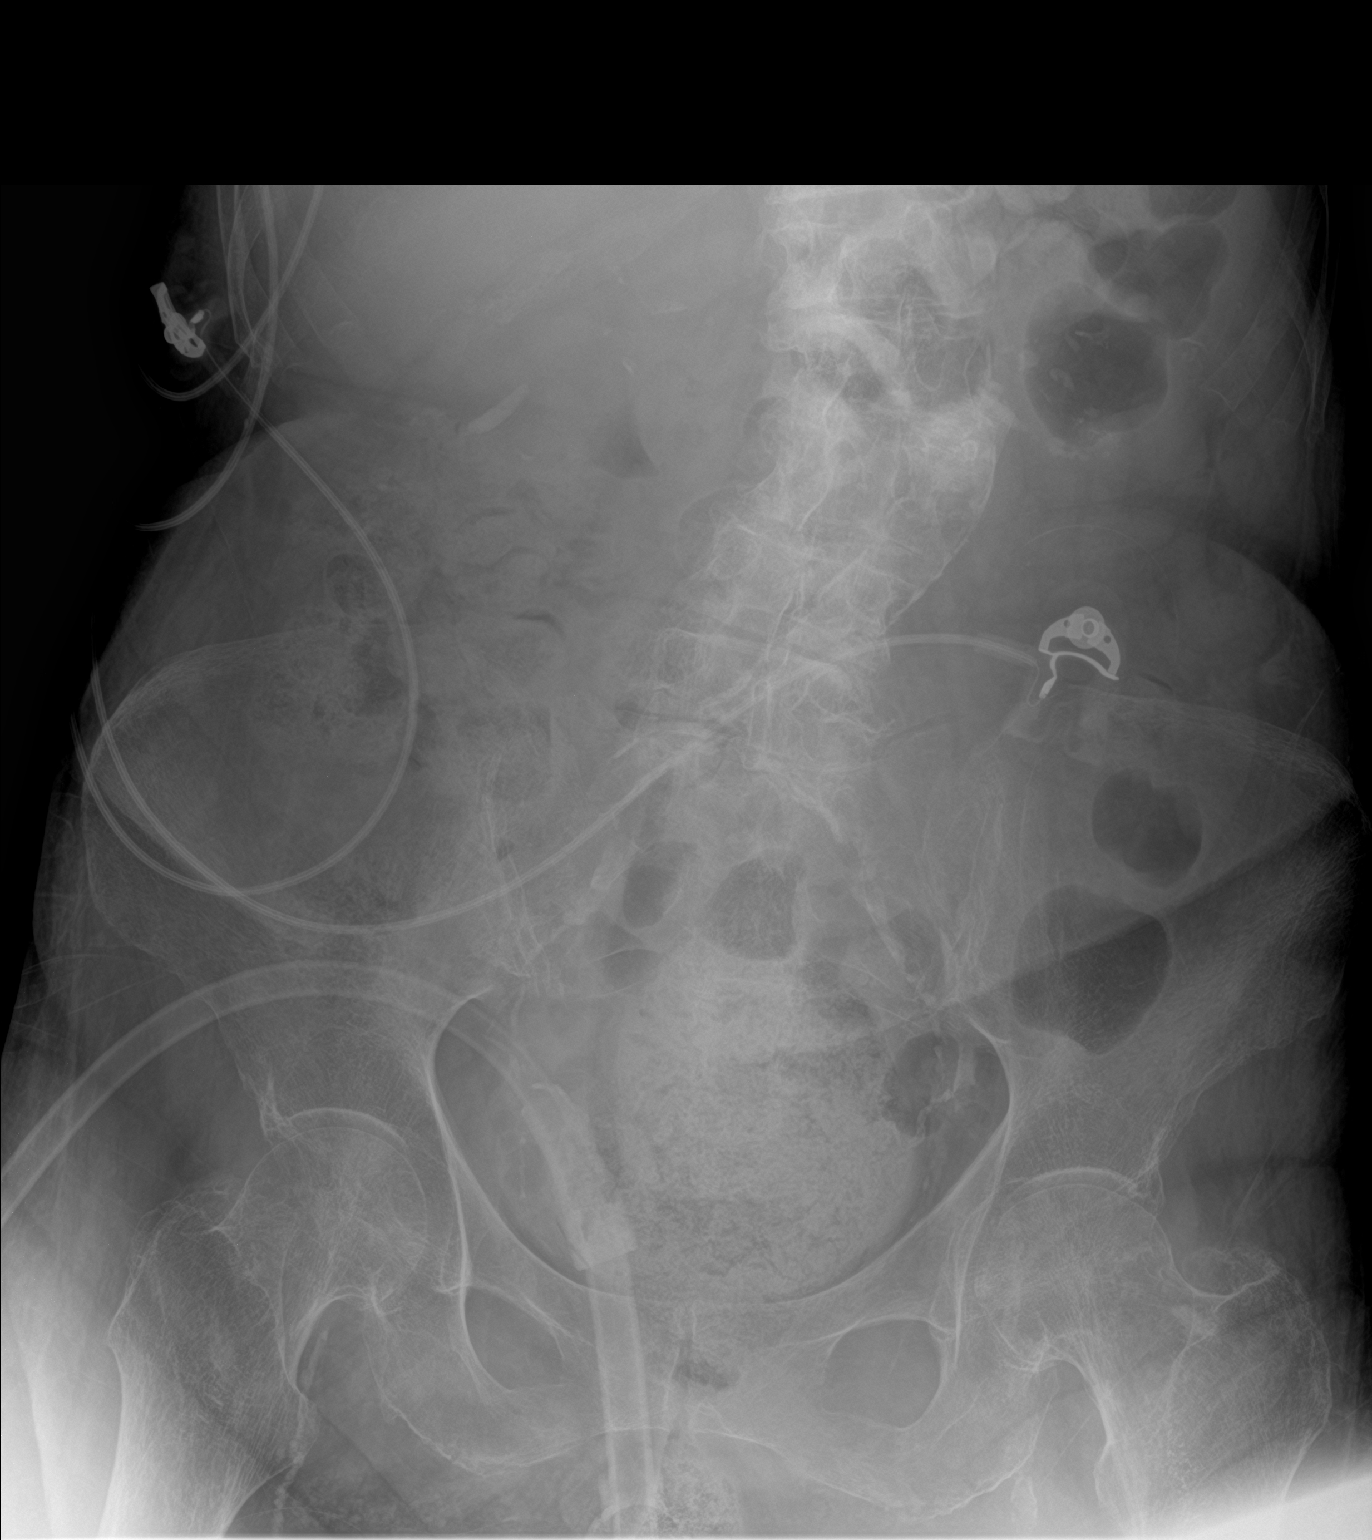

[1 of 1 positions shown; findings below may reference images not displayed]

FINDINGS: Increased stool in rectum and RIGHT colon.

Nonobstructive bowel gas pattern.

No bowel dilatation or bowel wall thickening.

Osseous demineralization with levoconvex scoliosis and degenerative
changes of lumbar spine.

Extensive atherosclerotic calcifications of aorta, iliac arteries,
and visceral arteries.
IMPRESSION: Increased stool in rectum and RIGHT colon.
